# Patient Record
Sex: Female | Born: 1955
Health system: Southern US, Community
[De-identification: ages and names within clinical notes are randomized; demographics above are authoritative.]

## PROBLEM LIST (undated history)

## (undated) DIAGNOSIS — M1990 Unspecified osteoarthritis, unspecified site: Secondary | ICD-10-CM

## (undated) DIAGNOSIS — K219 Gastro-esophageal reflux disease without esophagitis: Secondary | ICD-10-CM

## (undated) DIAGNOSIS — I10 Essential (primary) hypertension: Secondary | ICD-10-CM

## (undated) DIAGNOSIS — D649 Anemia, unspecified: Secondary | ICD-10-CM

## (undated) DIAGNOSIS — N200 Calculus of kidney: Secondary | ICD-10-CM

## (undated) DIAGNOSIS — I719 Aortic aneurysm of unspecified site, without rupture: Secondary | ICD-10-CM

## (undated) DIAGNOSIS — K589 Irritable bowel syndrome without diarrhea: Secondary | ICD-10-CM

## (undated) DIAGNOSIS — N183 Chronic kidney disease, stage 3 unspecified: Secondary | ICD-10-CM

## (undated) DIAGNOSIS — T8859XA Other complications of anesthesia, initial encounter: Secondary | ICD-10-CM

## (undated) DIAGNOSIS — F419 Anxiety disorder, unspecified: Secondary | ICD-10-CM

## (undated) DIAGNOSIS — K649 Unspecified hemorrhoids: Secondary | ICD-10-CM

## (undated) DIAGNOSIS — E785 Hyperlipidemia, unspecified: Secondary | ICD-10-CM

## (undated) DIAGNOSIS — T4145XA Adverse effect of unspecified anesthetic, initial encounter: Secondary | ICD-10-CM

## (undated) DIAGNOSIS — E669 Obesity, unspecified: Secondary | ICD-10-CM

## (undated) DIAGNOSIS — G43909 Migraine, unspecified, not intractable, without status migrainosus: Secondary | ICD-10-CM

## (undated) DIAGNOSIS — F32A Depression, unspecified: Secondary | ICD-10-CM

## (undated) HISTORY — DX: Essential (primary) hypertension: I10

## (undated) HISTORY — DX: Unspecified hemorrhoids: K64.9

## (undated) HISTORY — DX: Anxiety disorder, unspecified: F41.9

## (undated) HISTORY — PX: GASTRIC BYPASS: SHX52

## (undated) HISTORY — DX: Irritable bowel syndrome, unspecified: K58.9

## (undated) HISTORY — DX: Anemia, unspecified: D64.9

## (undated) HISTORY — DX: Obesity, unspecified: E66.9

## (undated) HISTORY — DX: Hyperlipidemia, unspecified: E78.5

## (undated) HISTORY — DX: Aortic aneurysm of unspecified site, without rupture: I71.9

## (undated) HISTORY — DX: Calculus of kidney: N20.0

## (undated) HISTORY — DX: Depression, unspecified: F32.A

## (undated) HISTORY — PX: ABLATION: SHX5711

## (undated) HISTORY — DX: Migraine, unspecified, not intractable, without status migrainosus: G43.909

---
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
## 1998-04-16 ENCOUNTER — Other Ambulatory Visit: Admission: RE | Admit: 1998-04-16 | Discharge: 1998-04-16 | Payer: Self-pay | Admitting: Obstetrics and Gynecology

## 1998-06-11 ENCOUNTER — Other Ambulatory Visit: Admission: RE | Admit: 1998-06-11 | Discharge: 1998-06-11 | Payer: Self-pay | Admitting: Obstetrics and Gynecology

## 1999-10-07 ENCOUNTER — Other Ambulatory Visit: Admission: RE | Admit: 1999-10-07 | Discharge: 1999-10-07 | Payer: Self-pay | Admitting: Obstetrics and Gynecology

## 1999-11-11 ENCOUNTER — Encounter (INDEPENDENT_AMBULATORY_CARE_PROVIDER_SITE_OTHER): Payer: Self-pay

## 1999-11-11 ENCOUNTER — Ambulatory Visit (HOSPITAL_COMMUNITY): Admission: RE | Admit: 1999-11-11 | Discharge: 1999-11-11 | Payer: Self-pay | Admitting: Obstetrics and Gynecology

## 2000-03-15 ENCOUNTER — Encounter: Payer: Self-pay | Admitting: Sports Medicine

## 2000-03-15 ENCOUNTER — Ambulatory Visit (HOSPITAL_COMMUNITY): Admission: RE | Admit: 2000-03-15 | Discharge: 2000-03-15 | Payer: Self-pay | Admitting: Sports Medicine

## 2000-03-25 HISTORY — PX: LUMBAR DISC SURGERY: SHX700

## 2000-03-29 ENCOUNTER — Ambulatory Visit (HOSPITAL_COMMUNITY): Admission: RE | Admit: 2000-03-29 | Discharge: 2000-03-30 | Payer: Self-pay | Admitting: Neurosurgery

## 2000-03-29 ENCOUNTER — Encounter: Payer: Self-pay | Admitting: Neurosurgery

## 2001-07-05 ENCOUNTER — Other Ambulatory Visit: Admission: RE | Admit: 2001-07-05 | Discharge: 2001-07-05 | Payer: Self-pay | Admitting: Obstetrics and Gynecology

## 2002-05-11 ENCOUNTER — Emergency Department (HOSPITAL_COMMUNITY): Admission: EM | Admit: 2002-05-11 | Discharge: 2002-05-11 | Payer: Self-pay | Admitting: Emergency Medicine

## 2002-06-26 ENCOUNTER — Other Ambulatory Visit: Admission: RE | Admit: 2002-06-26 | Discharge: 2002-06-26 | Payer: Self-pay | Admitting: Obstetrics and Gynecology

## 2003-01-24 HISTORY — PX: LASER ABLATION CONDYLOMA CERVICAL / VULVAR: SUR819

## 2003-12-10 ENCOUNTER — Other Ambulatory Visit: Admission: RE | Admit: 2003-12-10 | Discharge: 2003-12-10 | Payer: Self-pay | Admitting: Obstetrics and Gynecology

## 2005-03-10 ENCOUNTER — Other Ambulatory Visit: Admission: RE | Admit: 2005-03-10 | Discharge: 2005-03-10 | Payer: Self-pay | Admitting: Obstetrics and Gynecology

## 2005-07-24 ENCOUNTER — Emergency Department (HOSPITAL_COMMUNITY): Admission: EM | Admit: 2005-07-24 | Discharge: 2005-07-24 | Payer: Self-pay | Admitting: Emergency Medicine

## 2005-07-27 ENCOUNTER — Ambulatory Visit: Payer: Self-pay | Admitting: Family Medicine

## 2005-08-25 ENCOUNTER — Ambulatory Visit: Payer: Self-pay | Admitting: Family Medicine

## 2005-10-25 ENCOUNTER — Ambulatory Visit: Payer: Self-pay | Admitting: Family Medicine

## 2005-11-23 ENCOUNTER — Ambulatory Visit: Payer: Self-pay | Admitting: Family Medicine

## 2006-03-19 ENCOUNTER — Emergency Department (HOSPITAL_COMMUNITY): Admission: EM | Admit: 2006-03-19 | Discharge: 2006-03-19 | Payer: Self-pay | Admitting: Family Medicine

## 2006-03-22 ENCOUNTER — Ambulatory Visit: Payer: Self-pay | Admitting: Family Medicine

## 2006-03-22 ENCOUNTER — Encounter: Admission: RE | Admit: 2006-03-22 | Discharge: 2006-03-22 | Payer: Self-pay | Admitting: Family Medicine

## 2006-08-17 ENCOUNTER — Ambulatory Visit: Payer: Self-pay | Admitting: Family Medicine

## 2006-12-06 ENCOUNTER — Ambulatory Visit: Payer: Self-pay | Admitting: Family Medicine

## 2007-12-27 ENCOUNTER — Telehealth: Payer: Self-pay | Admitting: Family Medicine

## 2007-12-30 DIAGNOSIS — G43909 Migraine, unspecified, not intractable, without status migrainosus: Secondary | ICD-10-CM | POA: Insufficient documentation

## 2007-12-30 DIAGNOSIS — E669 Obesity, unspecified: Secondary | ICD-10-CM | POA: Insufficient documentation

## 2007-12-30 DIAGNOSIS — I10 Essential (primary) hypertension: Secondary | ICD-10-CM | POA: Insufficient documentation

## 2007-12-30 DIAGNOSIS — F411 Generalized anxiety disorder: Secondary | ICD-10-CM | POA: Insufficient documentation

## 2008-01-02 ENCOUNTER — Telehealth (INDEPENDENT_AMBULATORY_CARE_PROVIDER_SITE_OTHER): Payer: Self-pay | Admitting: Internal Medicine

## 2008-01-09 ENCOUNTER — Ambulatory Visit: Payer: Self-pay | Admitting: Family Medicine

## 2008-02-13 ENCOUNTER — Ambulatory Visit: Payer: Self-pay | Admitting: Family Medicine

## 2008-02-13 DIAGNOSIS — J069 Acute upper respiratory infection, unspecified: Secondary | ICD-10-CM | POA: Insufficient documentation

## 2008-03-09 ENCOUNTER — Telehealth (INDEPENDENT_AMBULATORY_CARE_PROVIDER_SITE_OTHER): Payer: Self-pay | Admitting: Internal Medicine

## 2008-05-15 ENCOUNTER — Ambulatory Visit: Payer: Self-pay | Admitting: Family Medicine

## 2008-07-13 ENCOUNTER — Telehealth: Payer: Self-pay | Admitting: Family Medicine

## 2008-08-27 ENCOUNTER — Telehealth (INDEPENDENT_AMBULATORY_CARE_PROVIDER_SITE_OTHER): Payer: Self-pay | Admitting: Internal Medicine

## 2008-12-10 ENCOUNTER — Ambulatory Visit: Payer: Self-pay | Admitting: Family Medicine

## 2008-12-10 DIAGNOSIS — K589 Irritable bowel syndrome without diarrhea: Secondary | ICD-10-CM | POA: Insufficient documentation

## 2009-01-01 ENCOUNTER — Telehealth (INDEPENDENT_AMBULATORY_CARE_PROVIDER_SITE_OTHER): Payer: Self-pay | Admitting: Internal Medicine

## 2009-01-11 ENCOUNTER — Encounter (INDEPENDENT_AMBULATORY_CARE_PROVIDER_SITE_OTHER): Payer: Self-pay | Admitting: Internal Medicine

## 2009-01-19 ENCOUNTER — Telehealth (INDEPENDENT_AMBULATORY_CARE_PROVIDER_SITE_OTHER): Payer: Self-pay | Admitting: Internal Medicine

## 2009-02-12 ENCOUNTER — Encounter: Payer: Self-pay | Admitting: Internal Medicine

## 2009-04-08 ENCOUNTER — Ambulatory Visit: Payer: Self-pay | Admitting: Family Medicine

## 2010-05-02 ENCOUNTER — Telehealth: Payer: Self-pay | Admitting: Family Medicine

## 2010-05-06 ENCOUNTER — Ambulatory Visit: Payer: Self-pay | Admitting: Family Medicine

## 2010-05-13 ENCOUNTER — Ambulatory Visit: Payer: Self-pay | Admitting: Family Medicine

## 2010-05-16 LAB — CONVERTED CEMR LAB
ALT: 14 units/L (ref 0–35)
AST: 18 units/L (ref 0–37)
Albumin: 3.8 g/dL (ref 3.5–5.2)
Alkaline Phosphatase: 107 units/L (ref 39–117)
BUN: 11 mg/dL (ref 6–23)
Bilirubin, Direct: 0.1 mg/dL (ref 0.0–0.3)
CO2: 27 meq/L (ref 19–32)
Calcium: 9 mg/dL (ref 8.4–10.5)
Chloride: 104 meq/L (ref 96–112)
Cholesterol: 193 mg/dL (ref 0–200)
Creatinine, Ser: 0.8 mg/dL (ref 0.4–1.2)
GFR calc non Af Amer: 80.92 mL/min (ref >60)
Glucose, Bld: 102 mg/dL — ABNORMAL HIGH (ref 70–99)
HDL: 42.9 mg/dL (ref >39.00)
LDL Cholesterol: 133 mg/dL — ABNORMAL HIGH (ref 0–99)
Potassium: 4.2 meq/L (ref 3.5–5.1)
Sodium: 139 meq/L (ref 135–145)
Total Bilirubin: 0.4 mg/dL (ref 0.3–1.2)
Total CHOL/HDL Ratio: 4
Total Protein: 7 g/dL (ref 6.0–8.3)
Triglycerides: 87 mg/dL (ref 0.0–149.0)
VLDL: 17.4 mg/dL (ref 0.0–40.0)

## 2010-08-15 ENCOUNTER — Telehealth: Payer: Self-pay | Admitting: Family Medicine

## 2010-09-25 HISTORY — PX: OTHER SURGICAL HISTORY: SHX169

## 2010-10-16 ENCOUNTER — Encounter: Payer: Self-pay | Admitting: Family Medicine

## 2010-10-25 NOTE — Progress Notes (Signed)
Summary: Refill Fluoxetine and Bupropion  Phone Note Refill Request Call back at [phone] Message [facility]:  [facility] on May 02, 2010 9:28 AM  Refills Requested: Medication #1:  WELLBUTRIN XL 300 MG XR24H-TAB 1 daily fo ranxiety  Medication #2:  PROZAC 20 MG  CAPS 1 once daily Patient has not been seen in over a year.   Method Requested: Electronic Initial call taken by: Sydell Axon LPN,  May 02, 2010 9:31 AM  Follow-up for Phone Call        please refill both for 30 days and have patient schedule appointment.  No rf on either.  Follow-up by: Crawford Givens MD,  May 02, 2010 10:02 AM  Additional Follow-up for Phone Call Additional follow up Details #1::        Patient Advised.  Additional Follow-up by: [name] CMA Anuhea Gassner Dull),  May 02, 2010 10:37 AM    Prescriptions: WELLBUTRIN XL 300 MG XR24H-TAB (BUPROPION HCL) 1 daily fo ranxiety  #30 x 0   Entered by:   [name] CMA (AAMA)   Authorized by:   Crawford Givens MD   Signed by:   [name] CMA (AAMA) on 05/02/2010   Method used:   Electronically to        [facility] (retail)       [address]       [place], [place]  [address]       Ph: [phone] or [phone]       Fax: [phone]   RxID:   [id number] PROZAC 20 MG  CAPS (FLUOXETINE HCL) 1 once daily  #30 Capsule x 0   Entered by:   [name] CMA (AAMA)   Authorized by:   Crawford Givens MD   Signed by:   [name] CMA (AAMA) on 05/02/2010   Method used:   Elect[facility]        [facility] (retail)       [address]       [place], [place]  [address]       Ph: [phone] or [phone]       Fax: [phone]   RxID:   [id number]

## 2010-10-25 NOTE — Letter (Signed)
Summary: Pre Visit No Show Letter  [facility]  [address] [place], [place] [address]   Phone: [phone]  Fax: [phone]        Feb 12, 2009 MRN: [id number]    [facility] [address] [place], [place]  [address]    Dear Ms. Rens,   We have been unable to reach you by phone concerning the pre-procedure visit that you missed on 02/12/2009. For this reason,your procedure scheduled on06/12/2008 has been cancelled. Our scheduling staff will gladly assist you with rescheduling your appointments at a more convenient time. Please call our office at [phone] between the hours of 8:00am and 5:00pm, press option #2 to reach an appointment scheduler. Please consider updating your contact numbers at this time so that we can reach you by phone in the future with schedule changes or results.    Thank you,    Wyona Almas RN [facility]

## 2010-10-25 NOTE — Progress Notes (Signed)
Summary: fluoxetine refill  Phone Note Refill Request Message from:  [facility] on March 09, 2008 9:06 AM  Refills Requested: Medication #1:  PROZAC 20 MG  CAPS 1 once daily   Last Ref[facility]: [facility]/02/[facility] # [phone]  Initial call taken by: Cooper Render,  March 09, 2008 9:07 AM  Follow-up for Phone Call        Rx attatched   Billie-Lynn Tyler Deis FNP  March 09, 2008 11:40 AM   Additional Follow-up for Phone Call Additional follow up Details #1::        med phoned to pharmacy. Additional Follow-up by: Cooper Render,  March 09, 2008 12:33 PM      Prescriptions: PROZAC 20 MG  CAPS (FLUOXETINE HCL) 1 once daily  #30 x 3   Entered and Authorized by:   Gildardo Griffes FNP   Signed by:   Gildardo Griffes FNP on 03/09/2008   Method used:   Telephoned to ...         RxID:   [id number]

## 2010-10-25 NOTE — Assessment & Plan Note (Signed)
Summary: 6 m  f/u  dlo   Vital Signs:  Patient profile:   55 year old female Height:      68 inches Weight:      293 pounds BMI:     44.71 Temp:     98.7 degrees F oral Pulse rate:   80 / minute Pulse rhythm:   regular BP sitting:   122 / 82  (left arm) Cuff size:   large  Vitals Entered By: Sydell Axon (December 10, 2008 3:51 PM)  CC:  6 month follow-up.  History of Present Illness: Here for 6 mo follow up --having constipation alternating with diarrhea for mo.  Father has had polyps, mother has had gastrectomy due to severe PUD  --rare ativan, Wellbutrin 300 doing well, wants to continue   --obesity--has tried [employer] and some exercise in the past.  has thought about bariatric surg    Allergies: 1)  ! Morphine 2)  ! Cipro  Past History:  Past Medical History:    Reviewed history from 12/30/2007 and no changes required:    Hypertension    Anxiety  Past Surgical History:    Reviewed history from 12/30/2007 and no changes required:    childbirth    T & A age 32    cervical ablation uncontrollable bleeding 5/04    c- section    L 4-5 diskectomy Dr. Elesa Hacker 7/01  Review of Systems CV:  Complains of swelling of feet; denies chest pain or discomfort, palpitations, and swelling of hands. Resp:  Denies cough, shortness of breath, and wheezing. GI:  Complains of constipation and diarrhea; denies bloody stools, indigestion, nausea, and vomiting. MS:  Denies joint pain and muscle aches. Psych:  See HPI; Complains of anxiety and depression.  Physical Exam  General:  alert, well-developed, well-nourished, and well-hydrated.  morbidly obese, NAD Neck:  no JVD and no carotid bruits.   Lungs:  normal respiratory effort, no intercostal retractions, no accessory muscle use, and normal breath sounds.   Heart:  normal rate, regular rhythm, and no murmur.   Extremities:  no edema either lowerlleg Neurologic:  alert & oriented X3 and gait normal.   Skin:  turgor normal,  color normal, and no rashes.   Psych:  normally interactive and good eye contact.     Impression & Recommendations:  Problem # 1:  IRRITABLE BOWEL SYNDROME (ICD-564.1) Assessment New new episode of diarrhea dominant IBS refer to GI for further eval Orders: Gastroenterology Referral (GI)  Problem # 2:  OBESITY (ICD-278.00) Assessment: Unchanged morbidly obese with no wt change in months---discussed referral to [employer] or for Bariatric surg eval--BMI 44+ does not think she can afford, has done [employer] with +,- effects ? referral for counseling re eating problem  Problem # 3:  HYPERTENSION (ICD-401.9) Assessment: Unchanged stable on current med--will continue see back in 3 mo for follow up Her updated medication list for this problem includes:    Lopressor 50 Mg Tabs (Metoprolol tartrate) .Marland Kitchen... Take 1 tablet by mouth two times a day  BP today: 122/82 Prior BP: 130/73 (05/15/2008)  Problem # 4:  ANXIETY (ICD-300.00) Assessment: Unchanged reports that she is stable oncurrent meds in current doses--rare ativan The following medications were removed from the medication list:    Wellbutrin Xl 150 Mg Tb24 (Bupropion hcl) .Marland Kitchen... 1 every am Her updated medication list for this problem includes:    Ativan 0.5 Mg Tabs (Lorazepam) .Marland Kitchen... Take 1 tablet by mouth once a day as  needed    Prozac 20 Mg Caps (Fluoxetine hcl) .Marland Kitchen... 1 once daily    Wellbutrin Xl 300 Mg Xr24h-tab (Bupropion hcl) .Marland Kitchen... 1 daily fo ranxiety  Complete Medication List: 1)  Lopressor 50 Mg Tabs (Metoprolol tartrate) .... Take 1 tablet by mouth two times a day 2)  Ativan 0.5 Mg Tabs (Lorazepam) .... Take 1 tablet by mouth once a day as needed 3)  Prozac 20 Mg Caps (Fluoxetine hcl) .Marland Kitchen.. 1 once daily 4)  Wellbutrin Xl 300 Mg Xr24h-tab (Bupropion hcl) .Marland Kitchen.. 1 daily fo ranxiety  Patient Instructions: 1)  refer to Dr Loreta [address], GI 2)  Please schedule a follow-up appointment in 3 months.      Current Allergies  (reviewed today): ! MORPHINE ! CIPRO

## 2010-10-25 NOTE — Progress Notes (Signed)
Summary: lorazepam  Phone Note Refill Request Message from:  Fax from Pharmacy on August 15, 2010 9:22 AM  Refills Requested: Medication #1:  ATIVAN 0.5 MG  TABS Take 1 tablet by mouth once a day as needed   Last Refilled: 05/06/2010 [phone].  Initial call taken by: Melody Comas,  August 15, 2010 9:30 AM  Follow-up for Phone Call        please call in.  Follow-up by: Crawford Givens MD,  August 15, 2010 12:32 PM  Additional Follow-up for Phone Call Additional follow up Details #1::        Medication phoned to pharmacy.  Additional Follow-up by: [name] CMA Elisabeth Strom Dull),  August 15, 2010 2:21 PM    Prescriptions: ATIVAN 0.5 MG  TABS (LORAZEPAM) Take 1 tablet by mouth once a day as needed  #30 x 0   Entered and Authorized by:   Crawford Givens MD   Signed by:   Crawford Givens MD on 08/15/2010   Method used:   Telephoned to ...       [facility] (retail)       [address]       [place], [place]  [address]       Ph: [phone] or [phone]       Fax: [phone]   RxID:   [id number]

## 2010-10-25 NOTE — Progress Notes (Signed)
Summary: wellbutrion refill  Phone Note Refill Request Message from:  [facility] on March 09, 2008 9:04 AM  Refills Requested: Medication #1:  WELLBUTRIN XL 150 MG  TB24 1 qam   Last Ref[facility]: [facility]/09/[facility]  ph # [phone]  Initial call taken by: Cooper Render,  March 09, 2008 9:04 AM  Follow-up for Phone Call        Rx ataatched   Billie-Lynn Tyler Deis FNP  March 09, 2008 11:37 AM   Additional Follow-up for Phone Call Additional follow up Details #1::        med phoned to pharmacy. Additional Follow-up by: Cooper Render,  March 09, 2008 12:32 PM      Prescriptions: WELLBUTRIN XL 150 MG  TB24 (BUPROPION HCL) 1 qam  #30 x 3   Entered and Authorized by:   Gildardo Griffes FNP   Signed by:   Gildardo Griffes FNP on 03/09/2008   Method used:   Telephoned to ...         RxID:   [id number]

## 2010-10-25 NOTE — Progress Notes (Signed)
Summary: f luoxetine refill  Phone Note Refill Request Message from:  [facility] on December 27, 2007 9:36 AM  Refills Requested: Medication #1:  fluoxetine 40 mg   Last Refilled: 11/25/2007 last ov 12/06/06. pt needs ov now.  pc to pt's home ph #, msg left on a m pt must have ov, please phone to schedule.  Initial call taken by: Cooper Render,  December 27, 2007 9:37 AM  Follow-up for Phone Call        Please give pt one month of medication as requested until she can get in to be seen. Follow-up by: Shaune Leeks MD,  December 27, 2007 10:18 AM  Additional Follow-up for Phone Call Additional follow up Details #1::        ok Additional Follow-up by: Cooper Render,  December 27, 2007 10:49 AM

## 2010-10-25 NOTE — Progress Notes (Signed)
Summary: Fluoxetine  Phone Note Refill Request Message from:  [facility] on July 13, 2008 10:08 AM  Refills Requested: Medication #1:  PROZAC 20 MG  CAPS 1 once daily [facility]  Phone:   [phone]   Method Requested: Telephone to Pharmacy Initial call taken by: [name],  July 13, 2008 10:08 AM      Prescriptions: PROZAC 20 MG  CAPS (FLUOXETINE HCL) 1 once daily  #30 Capsule x 3   Entered by:   [name]   Authorized by:   Shaune Leeks MD   Signed by:   [name] on 07/13/2008   Method used:   Electronically [facility]        [facility] (retail)       [address]       [place], [place]  [address]       Ph: [phone] or [phone]       Fax: [phone]   RxID:   [id number] PROZAC 20 MG  CAPS (FLUOXETINE HCL) 1 once daily  #30 Capsule x 3   Entered and Authorized by:   Shaune Leeks MD   Signed by:   Shaune Leeks MD on 07/13/2008   Method used:   Telephoned to ...         RxID:   [id number]

## 2010-10-25 NOTE — Assessment & Plan Note (Signed)
Summary: 3 M F/U  DLO   Vital Signs:  Patient Profile:   55 Years Old Female Weight:      295 pounds Temp:     99.5 degrees F oral Pulse rate:   81 / minute BP sitting:   130 / 73  (left arm) Cuff size:   large  Vitals Entered By: Cooper Render (May 15, 2008 3:48 PM)                 Chief Complaint:  3 m f/u.  History of Present Illness: Here for follow up of anxiety--had been doing wel until last few wks when co-workerand good friend  dxed with stage 4 inflamitory breast disease.  Would like to increase Wellbutrin to 300mg  as previously discussed.  Uses Ativan rarely.  Tolerating Lopressor without side effects.    Current Allergies (reviewed today): ! MORPHINE ! CIPRO  Past Medical History:    Reviewed history from 12/30/2007 and no changes required:       Hypertension       Anxiety     Review of Systems      See HPI   Physical Exam  General:     alert, well-developed, well-nourished, and well-hydrated.  wt loss of 11 lbs since 5/09 Lungs:     normal respiratory effort, no intercostal retractions, no accessory muscle use, and normal breath sounds.   Heart:     normal rate, regular rhythm, and no murmur.   Extremities:     trace pitting edema both lower legs at 4:30pm Neurologic:     alert & oriented X3 and gait normal.   Psych:     normally interactive, not anxious appearing, not depressed appearing, and tearful.      Impression & Recommendations:  Problem # 1:  HYPERTENSION (ICD-401.9) Assessment: Unchanged well controlled on current med--will continue Her updated medication list for this problem includes:    Lopressor 50 Mg Tabs (Metoprolol tartrate) .Marland Kitchen... Take 1 tablet by mouth two times a day   Problem # 2:  ANXIETY (ICD-300.00) Assessment: Unchanged will increase Wellbutrin to 300mg  once daily--call response in 3-4 wkssee back in 6 mo if tolerates increase Her updated medication list for this problem includes:    Ativan 0.5 Mg Tabs  (Lorazepam) .Marland Kitchen... Take 1 tablet by mouth once a day as needed    Prozac 20 Mg Caps (Fluoxetine hcl) .Marland Kitchen... 1 once daily    Wellbutrin Xl 150 Mg Tb24 (Bupropion hcl) .Marland Kitchen... 1 every am    Wellbutrin Xl 300 Mg Xr24h-tab (Bupropion hcl) .Marland Kitchen... 1 daily fo ranxiety   Complete Medication List: 1)  Lopressor 50 Mg Tabs (Metoprolol tartrate) .... Take 1 tablet by mouth two times a day 2)  Ativan 0.5 Mg Tabs (Lorazepam) .... Take 1 tablet by mouth once a day as needed 3)  Prozac 20 Mg Caps (Fluoxetine hcl) .Marland Kitchen.. 1 once daily 4)  Wellbutrin Xl 150 Mg Tb24 (Bupropion hcl) .Marland Kitchen.. 1 every am 5)  Wellbutrin Xl 300 Mg Xr24h-tab (Bupropion hcl) .Marland Kitchen.. 1 daily fo ranxiety   Patient Instructions: 1)  Please schedule a follow-up appointment in 6 months.   Prescriptions: WELLBUTRIN XL 300 MG XR24H-TAB (BUPROPION HCL) 1 daily fo ranxiety  #30 x 6   Entered and Authorized by:   Gildardo Griffes FNP   Signed by:   Gildardo Griffes FNP on 05/15/2008   Method used:   Print then Give to Patient   RxID:   [id number]  ] Prior  Medications (reviewed today): LOPRESSOR 50 MG  TABS (METOPROLOL TARTRATE) Take 1 tablet by mouth two times a day ATIVAN 0.5 MG  TABS (LORAZEPAM) Take 1 tablet by mouth once a day as needed PROZAC 20 MG  CAPS (FLUOXETINE HCL) 1 once daily WELLBUTRIN XL 150 MG  TB24 (BUPROPION HCL) 1 every am Current Allergies (reviewed today): ! MORPHINE ! CIPRO

## 2010-10-25 NOTE — Assessment & Plan Note (Signed)
Summary: 3 M F/U DLO   Vital Signs:  Patient profile:   55 year old female Height:      68 inches Weight:      283.75 pounds BMI:     43.30 Temp:     98.5 degrees F oral Pulse rate:   76 / minute Pulse rhythm:   regular BP sitting:   140 / 82  (left arm) Cuff size:   large  Vitals Entered By: Lewanda Rife LPN (April 08, 2009 3:53 PM)  CC:  three month follow up for hypertension.  History of Present Illness: Here for follow up of several medicalproblems --depression --taking wellbutrin and prozac--thinks doses are adequate, feels that she is stable mood-wise.  handling family stresses well--dad's MI, friend with cancer, etc --tolerating lopressor without side-effects --weight was not discussed  Had colonoscopy scheduled, put on hold due to dad's MI--will get rescheduled.  Allergies: 1)  ! Morphine 2)  ! Cipro   Impression & Recommendations:  Problem # 1:  HYPERTENSION (ICD-401.9) Assessment Unchanged stable on current med--will continue at this dose see back in 4 mo Her updated medication list for this problem includes:    Lopressor 50 Mg Tabs (Metoprolol tartrate) .Marland Kitchen... Take 1 tablet by mouth two times a day  BP today: 140/82 Prior BP: 122/82 (12/10/2008)  Problem # 2:  ANXIETY (ICD-300.00) Assessment: Unchanged stable on current meds--will continue at current doses denies homocidal or suicidakl ideation Her updated medication list for this problem includes:    Ativan 0.5 Mg Tabs (Lorazepam) .Marland Kitchen... Take 1 tablet by mouth once a day as needed    Prozac 20 Mg Caps (Fluoxetine hcl) .Marland Kitchen... 1 once daily    Wellbutrin Xl 300 Mg Xr24h-tab (Bupropion hcl) .Marland Kitchen... 1 daily fo ranxiety  Problem # 3:  OBESITY (ICD-278.00) Assessment: Comment Only has lost 10 lbs over past 6 mo--  Complete Medication List: 1)  Lopressor 50 Mg Tabs (Metoprolol tartrate) .... Take 1 tablet by mouth two times a day 2)  Ativan 0.5 Mg Tabs (Lorazepam) .... Take 1 tablet by mouth once a day as  needed 3)  Prozac 20 Mg Caps (Fluoxetine hcl) .Marland Kitchen.. 1 once daily 4)  Wellbutrin Xl 300 Mg Xr24h-tab (Bupropion hcl) .Marland Kitchen.. 1 daily fo ranxiety 5)  Ibuprofen 200 Mg Tabs (Ibuprofen) .... Otc as directed 6)  Promethazine Hcl 25 Mg Tabs (Promethazine hcl) .... Takes 1/2 to 1 as needed for nausea  Patient Instructions: 1)  Please schedule a follow-up appointment in 4 months. Prescriptions: LOPRESSOR 50 MG  TABS (METOPROLOL TARTRATE) Take 1 tablet by mouth two times a day  #60 Tablet x 6   Entered and Authorized by:   Gildardo Griffes FNP   Signed by:   Gildardo Griffes FNP on 04/08/2009   Method used:   [facility]        [facility] (retail)       [address]       [place], [place]  [address]       Ph: [phone] or [phone]       Fax: [phone]   RxID:   [id number] WELLBUTRIN XL 300 MG XR24H-TAB (BUPROPION HCL) 1 daily fo ranxiety  #30 x 6   Entered and Authorized by:   Gildardo Griffes FNP   Signed by:   Gildardo Griffes FNP on 04/08/2009   Method used:   Electronically to        [facility] [phone]* (retail)  [address]       [place], [place]  [address]       Ph: [phone] or [phone]       Fax: [phone]   RxID:   [id number] PROZAC 20 MG  CAPS (FLUOXETINE HCL) 1 once daily  #30 Capsule x 6   Entered and Authorized by:   Gildardo Griffes FNP   Signed by:   Gildardo Griffes FNP on 04/08/2009   Method used:   Electronically to        [facility] (retail)       [address]       [place], [place]  [address]       Ph: [phone] or [phone]       Fax: [phone]   RxID:   [id number]   Current Allergies (reviewed today): ! MORPHINE ! CIPRO

## 2010-10-25 NOTE — Letter (Signed)
Summary: Pt.cancelled appt. w/ Dr.Jyothi Mann  Pt.cancelled appt. w/ Dr.Jyothi Mann   Imported By: Beau Fanny 01/13/2009 09:18:00  _____________________________________________________________________  External Attachment:    Type:   Image     Comment:   External Document

## 2010-10-25 NOTE — Assessment & Plan Note (Signed)
Summary: F/U BP,[name]/CLE   Vital Signs:  Patient profile:   55 year old female Height:      68 inches Weight:      280.75 pounds BMI:     42.84 Temp:     98.6 degrees F oral Pulse rate:   80 / minute Pulse rhythm:   regular BP sitting:   122 / 74  (left arm) Cuff size:   large  Vitals Entered By: [name] CMA Carmen Buchanan) (May 06, 2010 2:44 PM) CC: F/U  BP  (BDB pt.).  Pt. needs refills on Prozac, Wellbutrin and Lorazepam.   History of Present Illness: Hypertension:      Using medication without problems or lightheadedness: yes Chest pain with exertion: no Edema: no Short of breath: only due to deconditioning/habits Average home BPs: usually similar to today Other issues: no  Anxiety: On meds for years due to panic dx in 2006.  Rare use of BZD.  Mood okay and sleeping well usually.  No SI/HI.    Minimal exercise.  Interested in walking.    Allergies: 1)  ! Morphine 2)  ! Cipro  Past History:  Past Medical History: Anxiety HTN obesity intermittent IBS symptoms   Family History: Reviewed history from 12/30/2007 and no changes required. fam hx of:  CAD, colon polyps F alive, CAD/MI, CABG M alive, AAA on betablocker, cardiac valvular disease, stomach ulcers  Social History: Reviewed history from 12/30/2007 and no changes required. Marital Status: Married, 1982 Children: 1 son Occupation: Sales executive in [employer] no tob alcohol, minimal  likes to [employer], church, time with family minimal exercise  Review of Systems       See HPI.  Otherwise negative.    Physical Exam  General:  GEN: nad, alert and oriented, obese HEENT: mucous membranes moist NECK: supple w/o LA CV: rrr.  no murmur PULM: ctab, no inc wob ABD: soft, +bs EXT: no edema SKIN: no acute rash    Impression & Recommendations:  Problem # 1:  ANXIETY (ICD-300.00) No change in meds.  d/w patient UJ:[id number] and she is to consider.  symptoms controlled.  Her updated  medication list for this problem includes:    Ativan 0.5 Mg Tabs (Lorazepam) .Marland Kitchen... Take 1 tablet by mouth once a day as needed    Prozac 20 Mg Caps (Fluoxetine hcl) .Marland Kitchen... 1 once daily    Wellbutrin Xl 300 Mg Xr24h-tab (Bupropion hcl) .Marland Kitchen... 1 daily fo ranxiety  Problem # 2:  HYPERTENSION (ICD-401.9) No change in meds.  d/w patient [id number] and weight.  return for labs.  Her updated medication list for this problem includes:    Lopressor 50 Mg Tabs (Metoprolol tartrate) .Marland Kitchen... Take 1 tablet by mouth two times a day  Complete Medication List: 1)  Lopressor 50 Mg Tabs (Metoprolol tartrate) .... Take 1 tablet by mouth two times a day 2)  Ativan 0.5 Mg Tabs (Lorazepam) .... Take 1 tablet by mouth once a day as needed 3)  Prozac 20 Mg Caps (Fluoxetine hcl) .Marland Kitchen.. 1 once daily 4)  Wellbutrin Xl 300 Mg Xr24h-tab (Bupropion hcl) .Marland Kitchen.. 1 daily fo ranxiety 5)  Ibuprofen 200 Mg Tabs (Ibuprofen) .... Otc as directed 6)  Promethazine Hcl 25 Mg Tabs (Promethazine hcl) .... Takes 1/2 to 1 as needed for nausea  Patient Instructions: 1)  Come back for fasting labs. We'll contact you with your lab report.  2)  CMET 401.1 3)  lipid  401.1 Prescriptions: ATIVAN 0.5 MG  TABS (LORAZEPAM)  Take 1 tablet by mouth once a day as needed  #30 x 0   Entered and Authorized by:   Crawford Givens MD   Signed by:   Crawford Givens MD on 05/06/2010   Method used:   Print then Give to Patient   RxID:   [id number] WELLBUTRIN XL 300 MG XR24H-TAB (BUPROPION HCL) 1 daily fo ranxiety  #30 x 12   Entered and Authorized by:   Crawford Givens MD   Signed by:   Crawford Givens MD on 05/06/2010   Method used:   Electronically to        [facility]  [place] (retail)       [address]       [place], [place]  [address]       Ph: [phone] or [phone]       Fax: [phone]   RxID:   [id number] PROZAC 20 MG  CAPS (FLUOXETINE HCL) 1 once daily  #30 Capsule x 12   Entered and Authorized by:   Crawford Givens MD    Signed by:   Crawford Givens MD on 05/06/2010   Method used:   Electronically to        [facility]  [place] (retail)       [address]       [place], [place]  [address]       Ph: [phone] or [phone]       Fax: [phone]   RxID:   [id number] LOPRESSOR 50 MG  TABS (METOPROLOL TARTRATE) Take 1 tablet by mouth two times a day  #60 Tablet x 12   Entered and Authorized by:   Crawford Givens MD   Signed by:   Crawford Givens MD on 05/06/2010   Method used:   Electronically to        [facility]  [place] (retail)       [address]       [place], [place]  [address]       Ph: [phone] or [phone]       Fax: [phone]   RxID:   [id number]   Current Allergies (reviewed today): ! MORPHINE ! CIPRO

## 2010-10-25 NOTE — Assessment & Plan Note (Signed)
Summary: 1 MONTH FOLLOWUP/RBH   Vital Signs:  Patient Profile:   55 Years Old Female Weight:      306 pounds Temp:     98.2 degrees F oral Pulse rate:   76 / minute BP sitting:   134 / 82  (left arm) Cuff size:   large  Vitals Entered By: Cooper Render (Feb 13, 2008 3:28 PM)                 Chief Complaint:  1 mth f/u.  History of Present Illness: here fo r follow up of start of wellbutrin and reduction of proxac---feels better.  Wants to continue this dose. Some walking.    Current Allergies (reviewed today): ! MORPHINE ! CIPRO     Review of Systems      See HPI   Physical Exam  General:     alert, well-developed, well-nourished, well-hydrated, and overweight-appearing.  NAD Ears:     Tms retracted with fluid bilat Nose:     mucosal erythema and mucosal edema.  sinuses +,- Mouth:     no exudates and pharyngeal erythema.   Lungs:     normal respiratory effort, no intercostal retractions, no accessory muscle use, and normal breath sounds.   Psych:     Oriented X3, normally interactive, good eye contact, not anxious appearing, and not depressed appearing.      Impression & Recommendations:  Problem # 1:  ANXIETY (ICD-300.00) Assessment: Improved better on current meds--will continue same doses see back in 3 mo. Her updated medication list for this problem includes:    Ativan 0.5 Mg Tabs (Lorazepam) .Marland Kitchen... Take 1 tablet by mouth once a day as needed    Prozac 20 Mg Caps (Fluoxetine hcl) .Marland Kitchen... 1 once daily    Wellbutrin Xl 150 Mg Tb24 (Bupropion hcl) .Marland Kitchen... 1 qam   Problem # 2:  URI (ICD-465.9) Assessment: New continue comfort care measures: increase po fluids, rest, tylenol or IBP as needed will start Septra x 10d see back if not improved  Complete Medication List: 1)  Lopressor 50 Mg Tabs (Metoprolol tartrate) .... Take 1 tablet by mouth two times a day 2)  Ativan 0.5 Mg Tabs (Lorazepam) .... Take 1 tablet by mouth once a day as needed 3)   Prozac 20 Mg Caps (Fluoxetine hcl) .Marland Kitchen.. 1 once daily 4)  Wellbutrin Xl 150 Mg Tb24 (Bupropion hcl) .Marland Kitchen.. 1 qam 5)  Septra Ds 800-160 Mg Tabs (Sulfamethoxazole-trimethoprim) .Marland Kitchen.. 1 two times a day by mouth   Patient Instructions: 1)  Please schedule a follow-up appointment in 3 months.   Prescriptions: SEPTRA DS 800-160 MG  TABS (SULFAMETHOXAZOLE-TRIMETHOPRIM) 1 two times a day by mouth  #20 x 0   Entered and Authorized by:   Gildardo Griffes FNP   Signed by:   Gildardo Griffes FNP on 02/13/2008   Method used:   Print then Give to Patient   RxID:   [id number] WELLBUTRIN XL 150 MG  TB24 (BUPROPION HCL) 1 qam  #30 x 3   Entered and Authorized by:   Gildardo Griffes FNP   Signed by:   Gildardo Griffes FNP on 02/13/2008   Method used:   Print then Give to Patient   RxID:   [id number]  ] Prior Medications (reviewed today): LOPRESSOR 50 MG  TABS (METOPROLOL TARTRATE) Take 1 tablet by mouth two times a day ATIVAN 0.5 MG  TABS (LORAZEPAM) Take 1 tablet by mouth once a day as needed PROZAC  20 MG  CAPS (FLUOXETINE HCL) 1 once daily WELLBUTRIN XL 150 MG  TB24 (BUPROPION HCL) 1 qam Current Allergies (reviewed today): ! MORPHINE ! CIPRO

## 2010-10-25 NOTE — Progress Notes (Signed)
Summary: refill fluoxetine  Phone Note Refill Request Message from:  [employer] on January 19, 2009 8:26 AM  Refills Requested: Medication #1:  PROZAC 20 MG  CAPS 1 once daily   Last Ref[facility]: [facility]/11/2008 [phone]  Initial call taken by: Providence Crosby,  January 19, 2009 8:26 AM  Follow-up for Phone Call        Rx complete  Billie-Lynn Tyler Deis FNP  January 19, 2009 8:27 AM   Rx called to pharmacy. Follow-up by: Sydell Axon,  January 19, 2009 9:09 AM      Prescriptions: PROZAC 20 MG  CAPS (FLUOXETINE HCL) 1 once daily  #30 Capsule x 3   Entered and Authorized by:   Gildardo Griffes FNP   Signed by:   Sydell Axon on 01/19/2009   Method used:   Telephoned to ...         RxID:   [id number]

## 2010-10-25 NOTE — Progress Notes (Signed)
Summary: Prozac phoned in to pharmacy  Prozac phoned in to pharmacy   Imported By: Beau Fanny 01/09/2008 10:12:46  _____________________________________________________________________  External Attachment:    Type:   Image     Comment:   External Document

## 2010-10-25 NOTE — Progress Notes (Signed)
Summary: wants meds for travel  Phone Note Call from Patient Call back at [phone]   Caller: Patient Call For: Gildardo Griffes FNP Summary of Call: Pt will be going to [place] and is requesting scripts for doxycyline and cipro- wants 45 days on the doxy.  [facility].  She said you did this for her last year. Initial call taken by: Lowella Petties,  August 27, 2008 9:06 AM  Follow-up for Phone Call        Rxs sent  Billie-Lynn Tyler Deis FNP  August 27, 2008 1:28 PM     New/Updated Medications: DOXYCYCLINE HYCLATE 100 MG CPEP (DOXYCYCLINE HYCLATE) 1 daily for 1-2d prior to leaving and continue for 4 wks daily after return CIPRO 500 MG TABS (CIPROFLOXACIN HCL) 1 two times a day at onset of diarrhea and continue for 5-10d by mouth   Prescriptions: CIPRO 500 MG TABS (CIPROFLOXACIN HCL) 1 two times a day at onset of diarrhea and continue for 5-10d by mouth  #20 x 0   Entered and Authorized by:   Gildardo Griffes FNP   Signed by:   Gildardo Griffes FNP on 08/27/2008   Method used:   [facility]        [facility] [id number]       [address]       [place], [place]  [address]       Ph: [phone] or [phone]       Fax: [phone]   RxID:   [id number] DOXYCYCLINE HYCLATE 100 MG CPEP (DOXYCYCLINE HYCLATE) 1 daily for 1-2d prior to leaving and continue for 4 wks daily after return  #45 x 0   Entered and Authorized by:   Gildardo Griffes FNP   Signed by:   Gildardo Griffes FNP on 08/27/2008   Method used:   [facility]        [facility] (retail)       [address]       [place], [place]  [address]       Ph: [phone] or [phone]       Fax: [phone]   RxID:   [id number]

## 2010-10-25 NOTE — Progress Notes (Signed)
Summary: refill bupropion   Phone Note Refill Request Message from:  [facility] on January 01, 2009 8:07 AM  Refills Requested: Medication #1:  WELLBUTRIN XL 300 MG XR24H-TAB 1 daily fo ranxiety.   Last Refilled: [facility]/2008 [facility] [phone]  Initial call taken by: Providence Crosby,  January 01, 2009 8:08 AM      Prescriptions: WELLBUTRIN XL 300 MG XR24H-TAB (BUPROPION HCL) 1 daily fo ranxiety  #30 x 6   Entered and Authorized by:   Gildardo Griffes FNP   Signed by:   Gildardo Griffes FNP on 01/01/2009   Method used:   [facility]        [facility] (retail)       [address]       [place], [place]  [address]       Ph: [phone] or [phone]       Fax: [phone]   RxID:   [id number]

## 2010-10-25 NOTE — Assessment & Plan Note (Signed)
Summary: CHECK BP/CLE   Vital Signs:  Patient Profile:   55 Years Old Female Weight:      309 pounds Temp:     98.7 degrees F oral Pulse rate:   86 / minute BP sitting:   126 / 82  (left arm) Cuff size:   large  Vitals Entered By: Cooper Render (January 09, 2008 3:07 PM)                 Chief Complaint:  f/u HTN.  History of Present Illness: Here for follow up of HBP, not seen since 3/08.  Tolerating Lopressor without problem, does have lower leg edema by end of afternoon--stands during much of her work day.  Wants to discuss wt gain, emotional eating--feels she has gained since increaase in prozac dosage last yr.  Knows she also eats to comfort herself.    Current Allergies (reviewed today): ! MORPHINE ! CIPRO  Past Medical History:    Reviewed history from 12/30/2007 and no changes required:       Hypertension       Anxiety     Review of Systems      See HPI   Physical Exam  General:     alert, well-developed, well-nourished, and well-hydrated.  morbid obesity   NAD Neck:     normal carotid upstroke and no carotid bruits.   Lungs:     normal respiratory effort, no intercostal retractions, and no accessory muscle use.   Heart:     normal rate, regular rhythm, and no murmur.   Extremities:     trace pitting to mid shins bilat at 4pm Neurologic:     alert & oriented X3 and gait normal.   Skin:     turgor normal, color normal, and no rashes.   Psych:     normally interactive, good eye contact, not anxious appearing, and not depressed appearing.      Impression & Recommendations:  Problem # 1:  HYPERTENSION (ICD-401.9) Assessment: Unchanged stable on current med at current dose, will continue needs fasting labs Her updated medication list for this problem includes:    Lopressor 50 Mg Tabs (Metoprolol tartrate) .Marland Kitchen... Take 1 tablet by mouth two times a day   Problem # 2:  ANXIETY (ICD-300.00) Assessment: Deteriorated feel side effects have  increased since increase in dosage of prozac will reduce dose to 20mg  and add welbutrin 150 qma see back in 1 mo for follow up spent in room in counseling Her updated medication list for this problem includes:    Ativan 0.5 Mg Tabs (Lorazepam) .Marland Kitchen... Take 1 tablet by mouth once a day as needed    Prozac 20 Mg Caps (Fluoxetine hcl) .Marland Kitchen... 1 once daily    Wellbutrin Xl 150 Mg Tb24 (Bupropion hcl) .Marland Kitchen... 1 qam   Problem # 3:  OBESITY (ICD-278.00) Assessment: Deteriorated up 8lbs in past yr no exercise--agreed to walk for 20-30 min 4xwk over next mo--will keep log agreed to cut down on caloric intake--want 5 lbs off in next 1 mo--agreed  Complete Medication List: 1)  Lopressor 50 Mg Tabs (Metoprolol tartrate) .... Take 1 tablet by mouth two times a day 2)  Ativan 0.5 Mg Tabs (Lorazepam) .... Take 1 tablet by mouth once a day as needed 3)  Prozac 20 Mg Caps (Fluoxetine hcl) .Marland Kitchen.. 1 once daily 4)  Wellbutrin Xl 150 Mg Tb24 (Bupropion hcl) .Marland Kitchen.. 1 qam   Patient Instructions: 1)  Please schedule a follow-up appointment  in 1 month.    Prescriptions: WELLBUTRIN XL 150 MG  TB24 (BUPROPION HCL) 1 qam  #30 x 1   Entered and Authorized by:   Gildardo Griffes FNP   Signed by:   Gildardo Griffes FNP on 01/09/2008   Method used:   Print then Give to Patient   RxID:   [id number] PROZAC 20 MG  CAPS (FLUOXETINE HCL) 1 once daily  #30 x 1   Entered and Authorized by:   Gildardo Griffes FNP   Signed by:   Gildardo Griffes FNP on 01/09/2008   Method used:   Print then Give to Patient   RxID:   [id number]  ] Prior Medications (reviewed today): LOPRESSOR 50 MG  TABS (METOPROLOL TARTRATE) Take 1 tablet by mouth two times a day ATIVAN 0.5 MG  TABS (LORAZEPAM) Take 1 tablet by mouth once a day as needed Current Allergies (reviewed today): ! MORPHINE ! CIPRO

## 2010-11-11 ENCOUNTER — Encounter (HOSPITAL_COMMUNITY)
Admission: RE | Admit: 2010-11-11 | Discharge: 2010-11-11 | Disposition: A | Discharge status: Home / Self Care | Payer: PRIVATE HEALTH INSURANCE | Source: Ambulatory Visit | Attending: Obstetrics and Gynecology | Admitting: Obstetrics and Gynecology

## 2010-11-11 DIAGNOSIS — Z01812 Encounter for preprocedural laboratory examination: Secondary | ICD-10-CM | POA: Insufficient documentation

## 2010-11-11 DIAGNOSIS — Z01810 Encounter for preprocedural cardiovascular examination: Secondary | ICD-10-CM | POA: Insufficient documentation

## 2010-11-11 LAB — BASIC METABOLIC PANEL
BUN: 10 mg/dL (ref 6–23)
CO2: 26 mEq/L (ref 19–32)
Calcium: 8.9 mg/dL (ref 8.4–10.5)
Chloride: 104 mEq/L (ref 96–112)
Creatinine, Ser: 0.73 mg/dL (ref 0.4–1.2)
GFR calc Af Amer: >60 mL/min (ref >60)
GFR calc non Af Amer: >60 mL/min (ref >60)
Glucose, Bld: 73 mg/dL (ref 70–99)
Potassium: 4.1 mEq/L (ref 3.5–5.1)
Sodium: 136 mEq/L (ref 135–145)

## 2010-11-11 LAB — CBC
HCT: 40.3 % (ref 36.0–46.0)
Hemoglobin: 13.4 g/dL (ref 12.0–15.0)
MCH: 29.9 pg (ref 26.0–34.0)
MCHC: 33.3 g/dL (ref 30.0–36.0)
MCV: 90 fL (ref 78.0–100.0)
Platelets: 347 10*3/uL (ref 150–400)
RBC: 4.48 MIL/uL (ref 3.87–5.11)
RDW: 13.4 % (ref 11.5–15.5)
WBC: 10.1 10*3/uL (ref 4.0–10.5)

## 2010-11-17 ENCOUNTER — Other Ambulatory Visit: Payer: Self-pay | Admitting: Obstetrics and Gynecology

## 2010-11-17 ENCOUNTER — Ambulatory Visit (HOSPITAL_COMMUNITY): Payer: PRIVATE HEALTH INSURANCE

## 2010-11-17 ENCOUNTER — Ambulatory Visit (HOSPITAL_COMMUNITY)
Admission: RE | Admit: 2010-11-17 | Discharge: 2010-11-17 | Disposition: A | Discharge status: Home / Self Care | Payer: PRIVATE HEALTH INSURANCE | Source: Ambulatory Visit | Attending: Obstetrics and Gynecology | Admitting: Obstetrics and Gynecology

## 2010-11-17 DIAGNOSIS — N950 Postmenopausal bleeding: Secondary | ICD-10-CM | POA: Insufficient documentation

## 2010-11-17 DIAGNOSIS — N840 Polyp of corpus uteri: Secondary | ICD-10-CM | POA: Insufficient documentation

## 2010-11-23 NOTE — Op Note (Signed)
  NAMETRAVIS, PURK                ACCOUNT NO.:  [id number]  MEDICAL RECORD NO.:  [id number]           PATIENT TYPE:  O  LOCATION:  [facility]                          [facility]  PHYSICIAN:  Carlyn Mullenbach L. Leticia Coletta, M.D.DATE OF BIRTH:  1956/03/30  DATE OF PROCEDURE:  11/17/2010 DATE OF DISCHARGE:                              OPERATIVE REPORT   PREOPERATIVE DIAGNOSES: 1. Postmenopausal bleeding. 2. History of endometrial ablation.  POSTOPERATIVE DIAGNOSES: 1. Postmenopausal bleeding. 2. History of endometrial ablation. 3. Endometrial polyps.  PROCEDURES:  Dilation and curettage, hysteroscopy, ThermaChoice endometrial ablation, all performed with ultrasound guidance.  SURGEON:  Giovana Faciane L. Vincente Poli, MD  ANESTHESIA:  LMA with local.  FINDINGS:  Positive polyps seen.  SPECIMENS:  Uterine curettings, sent to pathology.  ESTIMATED BLOOD LOSS:  Minimal.  COMPLICATIONS:  None.  PROCEDURE:  The patient was taken to the operating room.  She is a 55- year-old female who has a history of endometrial ablation in the past with a ThermaChoice.  She returned to my office with postmenopausal bleeding.  Ultrasound did show some polypoid material near the fundus. Because of history of endometrial ablation, I felt we should do this procedure under ultrasound guidance to minimize the risk of uterine perforation because of the fear of stenosis of the lower uterine segment and the cervix.  With ultrasound guidance, this procedure was performed. She was prepped and draped.  In-and-out catheter was then performed. She was gently placed in Trendelenburg position.  A speculum was placed in the vagina.  The cervix was grasped with a tenaculum, paracervical block was performed in a standard fashion.  The cervical internal os was gently dilated with ultrasound guidance and actually surprisingly, it was done pretty easily.  This was done with [facility] dilators.  We then inserted the diagnostic hysteroscope,  and I could see in the endometrium that there was a lot of endometrial tissue and a lot of it appeared polypoid in nature.  It was in the upper portion of the uterus.  The hysteroscope was removed, and a sharp curette was inserted under ultrasound direction, and the uterus was thoroughly curetted of all tissue, was sent to pathology and polyp forceps were inserted as well and the tissue was sent to pathology.  We then inserted the ThermaChoice III balloon under ultrasound guidance and easily I was able to perform a ThermaChoice endometrial ablation according to the manufacturer's specifications for 8 minutes, intact balloon was removed.  There were no complications at all.  The patient tolerated the procedure well.  All sponge, lap, and instrument counts were correct x2.  The patient went to recovery room in stable condition.     Sheddrick Lattanzio L. Vincente Poli, M.D.     Florestine Avers  D:  11/17/2010  T:  11/18/2010  Job:  [id number]  Electronically Signed by Marcelle Overlie M.D. on 11/23/2010 01:44:13 PM

## 2011-02-10 NOTE — Op Note (Signed)
[facility]  Patient:    Carmen Buchanan, Carmen Buchanan                       MRN: [id number] Proc. Date: 11/11/99 Adm. Date:  [id number] Disc. Date: [id number] Attending:  Marcelle Overlie                           Operative Report  PREOPERATIVE DIAGNOSES:       Rule out endometrial polyp; rule out endometrial carcinoma.  POSTOPERATIVE DIAGNOSES:      Rule out endometrial polyp, rule out endometrial carcinoma.  PROCEDURE:                    Dilatation and curettage; hysteroscopy.  SURGEON:                      Marcelle Overlie, M.D.  ANESTHESIA:                   LMA.  ESTIMATED BLOOD LOSS:         Less than 50 cc.  FINDINGS:                     1. Shaggy endometrium.                               2. Posterior intramural fibroid.                               3. A questionable polyp which was removed in the                                  curettings.  COMPLICATIONS:                None.  DESCRIPTION OF PROCEDURE:     Patient was taken to the operating room where she was given anesthesia.  She was then placed in the low lithotomy position.  The vagina and vulva were prepped and draped in the usual sterile fashion.  In-and-out catheter was used to empty the bladder.  A speculum was placed in the vagina. he cervix was grasped with a tenaculum and the uterus was sounded to 10 cm.  The cervical internal os was gently dilated using Pratt dilators.  The diagnostic hysteroscope was then inserted into the uterine cavity and there was good distention.  The entire cavity was visualized.  There was noted to be very shaggy endometrium.  There was irregularity in the posterior wall of the uterus which as consistent with an intramural fibroid; there was also question of a polyp coming off the anterior wall of the uterus.  The diagnostic hysteroscope was removed and then a thorough sharp curettage was performed of the uterus.  The posterior fibroid was palpated and  felt with a curette.  All curettings were then sent to pathology for permanent section.  All sponge, lap and instrument counts were correct x 2.  Patient tolerated procedure well and went to recovery room in stable condition.   COMPLICATIONS:                None.  DRAINS:  None.DD:  11/11/99 TD:  11/13/99 Job: [id number]

## 2011-02-10 NOTE — Op Note (Signed)
[place]  Patient:    Carmen Buchanan, Carmen Buchanan                       MRN: [id number] Proc. Date: 03/29/00 Adm. Date:  [id number] Attending:  Jackelyn Knife                           Operative Report  PREOPERATIVE DIAGNOSIS:  Herniated intervertebral disk left L4-L5.  POSTOPERATIVE DIAGNOSIS:  Herniated intervertebral disk left L4-L5.  OPERATION PERFORMED:  Left L4-L5 hemilaminotomy and diskectomy.  SURGEON:  Izell Bowie. Elesa Hacker, M.D.  ASSISTANTOctavio Graves Cox.  INDICATIONS:  Ms. Oncale is a generally healthy 55 year old lady presented with back and radiating left leg pain, which did not respond to conservative measures.  She had a lumbosacral MRI study done in the third week of June and this showed a rather large disk rupture at L4-5 with a probable free fragment in the left lateral recess.  She developed numbness on the dorsum of the foot and also had weakness on examination that involved dorsiflexion.  She did not respond to conservative measures and is therefore admitted at this time for the procedure described below.  Details of the proposed operation were discussed with her and goals and risks were pointed out.  She understands the risks to include the risk of bleeding, infection, damage to the common dural tube, CSF leak, damage to nerve roots, which could produce weakness and/or numbness.  She understands that intraabdominal injury is possible.  PROCEDURE:  Under general endotracheal anesthesia, this lady was positioned prone over the laminectomy frame.  The lumbosacral area was prepped and the patient draped in usual manner.  A preliminary cross table lateral film was taken for positive identification of the proper operative level.  A midline incision was opened and the usual subperiosteal approach was carried out to the left L4-L5 intralaminar space.  A second intraoperative x-ray was taken at this point with a metallic marker in place to  definitively locate the proper operative level.  Utilizing the operating microscope and high-speed drill, a hemilaminotomy was performed.  The dural sac and nerve root were identified after overlying ligamentum flavum was removed.  Initial efforts at retraction of the dural sac and nerve roots medially was hampered because of an underlying soft tissue mass that proved to be a quite large disk herniation, part of which was extruded.  The extruded material was removed with a nerve hook and pituitary rongeur and this greatly facilitated retraction.  The interspace was then entered and cleaned of its contents using rongeurs and curets.  A careful search was then made for additional extruded fragments and none were found.  The wound was copiously irrigated with antibiotic solution and then closed in layers.  A sterile dressing was applied and the patient was taken to the recovery room in good condition. DD:  03/29/00 TD:  03/29/00 Job: [id number]

## 2011-02-10 NOTE — H&P (Signed)
[place]  Patient:    Carmen Buchanan, Carmen Buchanan                       MRN: [id number] Adm. 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  She presented with a chief complaint of pain originating in her low back, then radiating across the left buttock and on down to the posterior aspect of the leg to her foot and ankle.  Her symptoms have been particularly bad over the last two weeks, but started several weeks prior to that.  She has had no difficulty with her right leg and has had no trouble with bowel and bladder control.  She was treated conservatively and has been out of work for the last two weeks with no improvement in her back and left leg pain.  She underwent a lumbosacral MRI on March 15, 2000 and this demonstrated a rather large disk rupture at L4-5 on the left.  There was evidence that this herniation probably contained a free fragment in the left lateral recess.  PAST MEDICAL HISTORY:  No problems with high blood pressure, stroke or diabetes.  FAMILY HISTORY:  Her mother is living at age 90 but has stomach problems.  Her father is living at age 86 with heart and stomach problems.  She has two brothers, ages 44 and 76, living and well.  She has a 51 year old sister living and well.  She has one 24 year old child living and well.  PREVIOUS SURGERY:  Tonsillectomy in 1969, D&C in 2001 and a C-section in 1986.  CURRENT MEDICATIONS:  Ibuprofen and Percocet.  ALLERGIES:  She has no known drug allergies.  PERSONAL HISTORY:  She does not smoke or use alcohol.  REVIEW OF SYSTEMS:  She has had some headaches which have responded to conservative treatment.  Additional review of systems is pertinent as already mentioned in the history of present illness.  PHYSICAL EXAMINATION:  GENERAL:  She weighs  260 pounds and 5 feet 9 inches tall.  She is afebrile.  HEENT:  Examination is normal.  CHEST:  Clear.  CARDIAC:  No murmurs.  Heart sounds were somewhat distant because of obesity.  ABDOMEN:  Soft and nontender with no palpable masses.  NEUROLOGIC:  Sensorium and cranial nerves to be intact.  Range of motion of lumbosacral spine is limited on forward bending and tilting to the left with an increase in back and left leg pain.  She is able to walk on heel and toe but she does have dorsiflexor weakness of the foot and great toe on the left. Straight leg raising is negative on the right and extremely painful on the left.  Individual muscle testing shows significant dorsiflexor weakness involving the foot and great toe on the left side.  She has some hypesthesia over the top of the foot on the left side.  Again, she has no adenopathy or mass lesions in her back, buttocks or legs.  IMPRESSION:  Herniated intervertebral disk, central and left, L4-5. DD:  03/29/00 TD:  03/30/00 Job: [id number]

## 2011-02-23 ENCOUNTER — Other Ambulatory Visit: Payer: Self-pay | Admitting: Family Medicine

## 2011-02-24 ENCOUNTER — Other Ambulatory Visit: Payer: Self-pay | Admitting: *Deleted

## 2011-02-24 MED ORDER — BUPROPION HCL ER (XL) 300 MG PO TB24: 300.0000 mg | ORAL_TABLET | Freq: Every day | ORAL | Status: DC

## 2011-02-24 NOTE — Telephone Encounter (Signed)
rx sent by MD

## 2011-02-24 NOTE — Telephone Encounter (Signed)
Ok to refill Wellbutrin 

## 2011-05-13 ENCOUNTER — Other Ambulatory Visit: Payer: Self-pay | Admitting: Family Medicine

## 2011-06-15 ENCOUNTER — Encounter: Payer: Self-pay | Admitting: Family Medicine

## 2011-06-16 ENCOUNTER — Encounter: Payer: Self-pay | Admitting: Family Medicine

## 2011-06-16 ENCOUNTER — Ambulatory Visit (INDEPENDENT_AMBULATORY_CARE_PROVIDER_SITE_OTHER): Payer: PRIVATE HEALTH INSURANCE | Admitting: Family Medicine

## 2011-06-16 DIAGNOSIS — E669 Obesity, unspecified: Secondary | ICD-10-CM

## 2011-06-16 DIAGNOSIS — I10 Essential (primary) hypertension: Secondary | ICD-10-CM

## 2011-06-16 DIAGNOSIS — F411 Generalized anxiety disorder: Secondary | ICD-10-CM

## 2011-06-16 MED ORDER — BUPROPION HCL ER (XL) 300 MG PO TB24: 300.0000 mg | ORAL_TABLET | ORAL | Status: DC

## 2011-06-16 MED ORDER — METOPROLOL TARTRATE 50 MG PO TABS: 50.0000 mg | ORAL_TABLET | Freq: Two times a day (BID) | ORAL | Status: DC

## 2011-06-16 MED ORDER — FLUOXETINE HCL 20 MG PO CAPS: 20.0000 mg | ORAL_CAPSULE | Freq: Every day | ORAL | Status: DC

## 2011-06-16 NOTE — Assessment & Plan Note (Addendum)
Discussed comorbid conditions, provided with letter of necessity (see chart) Discussed how bariatric surgery is lifestyle change that needs commitment.  Pt aware, has been to seminar and w/u by surgery. Asked to bring copy of labs from upcoming precert appt.

## 2011-06-16 NOTE — Patient Instructions (Addendum)
Letter provided today. Refilled meds for 1 year. Please return as needed. Bring copy of labwork for [place] to have. I may have you make appointment with Dr. Algis Downs for physical to go over preventative care and to follow up on surgery (in 3-4 months).

## 2011-06-16 NOTE — Assessment & Plan Note (Addendum)
Chronic, stable. Did not take med this morning. BP Readings from Last 3 Encounters:  06/16/11 136/82  05/06/10 122/74  04/08/09 140/82  continue meds.

## 2011-06-16 NOTE — Progress Notes (Signed)
Subjective:    Patient ID: Carmen Buchanan, female    DOB: 1956/03/05, 55 y.o.   MRN: [id number]  HPI CC: HTN   Here for med refill.  Also would like letter for bariatric referral  HTN - did not take med this morning.  bp slightly elevated but HR 68.  No new HA, vision changes, CP/tightness, significant leg swelling.  Occasional SOB.  Prone to migraines.  Tolerates metoprolol fine.  Tries to limit salt, gets plenty of fluids.  Mood - stable on current meds.  Obesity - has been to seminar.  Has seen surgery.  Has appt for precert next week.  Also has back problems and knee pain, attributed to weight.  H/o ruptured disk 2001.  Has tried several weight loss products/programs - form [employer], weight watchers, low car diet, low fat diet, [place] diet, exercise throughout this.  Last CPE 1 year ago, likely due.  Medications and allergies reviewed and updated in chart.  Past histories reviewed and updated if relevant as below. Patient Active Problem List  Diagnoses  . OBESITY  . ANXIETY  . MIGRAINE HEADACHE  . HYPERTENSION  . URI  . IRRITABLE BOWEL SYNDROME   Past Medical History  Diagnosis Date  . Anxiety   . HTN (hypertension)   . Obesity   . IBS (irritable bowel syndrome)     intermittent  . Migraine, unspecified, without mention of intractable migraine without mention of status migrainosus    Past Surgical History  Procedure Date  . Tonsillectomy and adenoidectomy 1970  . Laser ablation condyloma cervical / vulvar 5/04    uncontrollable bleeding  . Cesarean section   . Lumbar disc surgery 7/01    L4-5; Dr. Elesa Hacker   History  Substance Use Topics  . Smoking status: Never Smoker   . Smokeless tobacco: Not on file  . Alcohol Use: Yes     Minimal   Family History  Problem Relation Age of Onset  . Coronary artery disease Father     MI; CABG  . Aortic aneurysm Mother     on betablocker  . Other Mother     Cardiovascualr disease  . Ulcers Mother   . Colon polyps     Family history   Allergies  Allergen Reactions  . Ciprofloxacin     REACTION: nausea  . Morphine     REACTION: HA and NAV   Current Outpatient Prescriptions on File Prior to Visit  Medication Sig Dispense Refill  . ibuprofen (ADVIL,MOTRIN) 200 MG tablet Take 200 mg by mouth every 6 (six) hours as needed.        Marland Kitchen LORazepam (ATIVAN) 0.5 MG tablet Take 0.5 mg by mouth daily as needed.         Review of Systems Per HPI    Objective:   Physical Exam  Nursing note and vitals reviewed. Constitutional: She appears well-developed and well-nourished. No distress.       obese  HENT:  Head: Normocephalic and atraumatic.  Mouth/Throat: Oropharynx is clear and moist. No oropharyngeal exudate.  Eyes: Conjunctivae and EOM are normal. Pupils are equal, round, and reactive to light. No scleral icterus.  Neck: Normal range of motion. Neck supple. Carotid bruit is not present.  Cardiovascular: Normal rate, regular rhythm, normal heart sounds and intact distal pulses.   No murmur heard. Pulmonary/Chest: Effort normal and breath sounds normal. No respiratory distress. She has no wheezes. She has no rales.  Abdominal: Soft. There is no tenderness.  No abd/renal bruit  Lymphadenopathy:    She has no cervical adenopathy.  Skin: Skin is warm and dry. No rash noted.  Psychiatric: She has a normal mood and affect.          Assessment & Plan:

## 2011-06-16 NOTE — Assessment & Plan Note (Signed)
Chronic stable. Mood stable.  Refilled med.

## 2011-07-19 ENCOUNTER — Encounter: Payer: Self-pay | Admitting: Family Medicine

## 2011-07-28 ENCOUNTER — Ambulatory Visit (INDEPENDENT_AMBULATORY_CARE_PROVIDER_SITE_OTHER): Payer: PRIVATE HEALTH INSURANCE | Admitting: Family Medicine

## 2011-07-28 ENCOUNTER — Encounter: Payer: Self-pay | Admitting: Family Medicine

## 2011-07-28 VITALS — BP 116/76 | HR 78 | Temp 98.7°F | Wt 264.0 lb

## 2011-07-28 DIAGNOSIS — J329 Chronic sinusitis, unspecified: Secondary | ICD-10-CM

## 2011-07-28 DIAGNOSIS — Z9884 Bariatric surgery status: Secondary | ICD-10-CM | POA: Insufficient documentation

## 2011-07-28 MED ORDER — AZITHROMYCIN 250 MG PO TABS: ORAL_TABLET | ORAL | Status: AC

## 2011-07-28 NOTE — Progress Notes (Signed)
F/u from gastric bypass surgery.  The bariatric clinic as been very supportive.  Surgery done ~2.5 weeks ago.  She been mainly on clear liquids.  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   Down 26lbs from preop weight on our scales.  She having some diarrhea and constipation. No pain now.   Cut back to 50mg /day on metoprolol.  She's to start actigall later in the week.   Headache, ear ache, sinus pressure.  No fevers.  +ST.  Occ sputum with cough.  Had some nausea from the surgery.    Meds, vitals, and allergies reviewed.   ROS: See HPI.  Otherwise, noncontributory.  Current outpatient prescriptions:buPROPion (WELLBUTRIN XL) 300 MG 24 hr tablet, Take 1 tablet (300 mg total) by mouth every morning., Disp: 30 tablet, Rfl: 11;  calcium carbonate (OS-CAL) 1250 MG chewable tablet, Chew 2 tablets by mouth 2 (two) times daily.  , Disp: , Rfl: ;  FLUoxetine (PROZAC) 20 MG capsule, Take 1 capsule (20 mg total) by mouth daily., Disp: 30 capsule, Rfl: 11 LORazepam (ATIVAN) 0.5 MG tablet, Take 0.5 mg by mouth daily as needed.  , Disp: , Rfl: ;  Methylcellulose, Laxative, (CITRUCEL) 500 MG TABS, Take 1 tablet by mouth daily.  , Disp: , Rfl: ;  metoCLOPramide (REGLAN) 10 MG tablet, Take 10 mg by mouth 4 (four) times daily as needed.  , Disp: , Rfl: ;  omeprazole (PRILOSEC) 20 MG capsule, Take 20 mg by mouth 2 (two) times daily.  , Disp: , Rfl:  azithromycin (ZITHROMAX Z-PAK) 250 MG tablet, Take 2 tablets (500 mg) on  Day 1,  followed by 1 tablet (250 mg) once daily on Days 2 through 5., Disp: 6 each, Rfl: 0  GEN: nad, alert and oriented, obese HEENT: mucous membranes moist, tm w/o erythema, nasal exam w/o erythema, clear discharge noted,  OP with cobblestoning, Max sinus ttp NECK: supple w/o LA CV: rrr.   PULM: ctab, no inc wob EXT: no edema SKIN: no acute rash, abd incisions healing well

## 2011-07-28 NOTE — Patient Instructions (Signed)
Stop the metoprolol and let us know if your pressure stays high (130s/80s or lower are okay). I would get a flu shot each fall.   Drink fluids as directed, take the antibiotics, and gargle with warm salt water for your throat.  This should gradually improve.  Take care.  Let us know if you have other concerns.

## 2011-07-29 ENCOUNTER — Encounter: Payer: Self-pay | Admitting: Family Medicine

## 2011-07-29 DIAGNOSIS — J329 Chronic sinusitis, unspecified: Secondary | ICD-10-CM | POA: Insufficient documentation

## 2011-07-29 NOTE — Assessment & Plan Note (Signed)
Zithromax, supportive care and f/u prn.  Nontoxic.

## 2011-07-29 NOTE — Assessment & Plan Note (Signed)
Recent events d/w pt, prev notes from bariatric clinic reviewed.  Abd wall healing, weight loss noted, will stop BB at this point.  She'll monitor BP at work and notify me if BP is sig elevated.  App help of bariatric clinic.  >25 min spent with face to face with patient, >50% counseling and/or coordinating care.

## 2011-07-31 ENCOUNTER — Encounter: Payer: Self-pay | Admitting: Family Medicine

## 2011-09-26 HISTORY — PX: HEMORRHOID SURGERY: SHX153

## 2011-11-07 ENCOUNTER — Encounter: Payer: Self-pay | Admitting: Family Medicine

## 2011-11-07 ENCOUNTER — Ambulatory Visit (INDEPENDENT_AMBULATORY_CARE_PROVIDER_SITE_OTHER): Payer: PRIVATE HEALTH INSURANCE | Admitting: Family Medicine

## 2011-11-07 DIAGNOSIS — K649 Unspecified hemorrhoids: Secondary | ICD-10-CM

## 2011-11-07 MED ORDER — HYDROCORTISONE 2.5 % RE CREA: TOPICAL_CREAM | Freq: Two times a day (BID) | RECTAL | Status: AC

## 2011-11-07 NOTE — Progress Notes (Signed)
Hemorrhoid pain.  Going on for about 10 days.  Pain at work, leaning over patients.  H/o gastric bypass.  Irritation from bowel movement frequency recently.  Not having to strain with BMs.  Was told she had int and ext hemorrhoids prev.   Anxiety, much improved with weight loss.  Weaning off prozac and wellbutrin.    Meds, vitals, and allergies reviewed.   ROS: See HPI.  Otherwise, noncontributory.  nad Chaperoned exam, mult ext hemorrhoids, circumferential, no acute thrombosis, no fissure

## 2011-11-07 NOTE — Patient Instructions (Signed)
I would try a sitz bath and use the foam.   Call if you need a referral to the surgery clinic.

## 2011-11-08 ENCOUNTER — Encounter: Payer: Self-pay | Admitting: Family Medicine

## 2011-11-08 ENCOUNTER — Telehealth: Payer: Self-pay | Admitting: Family Medicine

## 2011-11-08 DIAGNOSIS — K649 Unspecified hemorrhoids: Secondary | ICD-10-CM | POA: Insufficient documentation

## 2011-11-08 NOTE — Telephone Encounter (Signed)
Referral ordered

## 2011-11-08 NOTE — Telephone Encounter (Signed)
Pt would like a referral put in for Hemmorrhoid surgery. Pt prefer's Dr. Clent Ridges or Dr. Adolphus Birchwood at [facility] is possible. Pt is saying any day and time would be ok.

## 2011-11-08 NOTE — Assessment & Plan Note (Signed)
Sitz bath, topical tx and refer to gen surg.

## 2011-11-10 ENCOUNTER — Telehealth: Payer: Self-pay | Admitting: Family Medicine

## 2012-10-14 ENCOUNTER — Encounter: Payer: Self-pay | Admitting: Family Medicine

## 2012-10-14 ENCOUNTER — Ambulatory Visit (INDEPENDENT_AMBULATORY_CARE_PROVIDER_SITE_OTHER): Payer: PRIVATE HEALTH INSURANCE | Admitting: Family Medicine

## 2012-10-14 ENCOUNTER — Telehealth: Payer: Self-pay | Admitting: Family Medicine

## 2012-10-14 VITALS — BP 118/78 | HR 83 | Temp 98.1°F | Wt 206.0 lb

## 2012-10-14 DIAGNOSIS — J329 Chronic sinusitis, unspecified: Secondary | ICD-10-CM

## 2012-10-14 DIAGNOSIS — F411 Generalized anxiety disorder: Secondary | ICD-10-CM

## 2012-10-14 MED ORDER — LORAZEPAM 0.5 MG PO TABS: 0.5000 mg | ORAL_TABLET | Freq: Every day | ORAL | Status: DC | PRN

## 2012-10-14 MED ORDER — AMOXICILLIN-POT CLAVULANATE 875-125 MG PO TABS: 1.0000 | ORAL_TABLET | Freq: Two times a day (BID) | ORAL | Status: DC

## 2012-10-14 NOTE — Telephone Encounter (Signed)
Will see today at OV.

## 2012-10-14 NOTE — Telephone Encounter (Signed)
Patient Information:  Caller Name: Joleigh  Phone: [phone]  Patient: Carmen Buchanan, Carmen Buchanan  Gender: Female  DOB: 1955/12/15  Age: 57 Years  PCP: [name] [facility])  Pregnant: No  Office Follow Up:  Does the office need to follow up with this patient?: No  Instructions For The Office: N/A  RN Note:  pt reports feeling short of breath  Symptoms  Reason For Call & Symptoms: sinus infection symptoms (green drainage, deep cough)  Reviewed Health History In EMR: Yes  Reviewed Medications In EMR: Yes  Reviewed Allergies In EMR: Yes  Reviewed Surgeries / Procedures: Yes  Date of Onset of Symptoms: 10/07/2012  Treatments Tried: 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

## 2012-10-14 NOTE — Assessment & Plan Note (Signed)
Continue prn BZD.  

## 2012-10-14 NOTE — Progress Notes (Signed)
Has been sick for a week.  Zmax per dental clinic didn't help.  No fevers.  She has more stuffiness than SOB.  Cough started recently, no sputum.  Facial pressure.  + ear pain.  No wheeze.  Coughing in fits.  No myalgias.    Hadn't a flu shot this year.   Discussed.    H/o anxiety, controlled with prn- rare use- ativan.  Needs refill.  Good effect, no ADE.   Meds, vitals, and allergies reviewed.   ROS: See HPI.  Otherwise, noncontributory.  GEN: nad, alert and oriented HEENT: mucous membranes moist, tm w/o erythema, nasal exam w/o erythema, clear discharge noted,  OP with cobblestoning, sinuses ttp x4 NECK: supple w/o LA CV: rrr.   PULM: ctab, no inc wob EXT: no edema SKIN: no acute rash

## 2012-10-14 NOTE — Patient Instructions (Addendum)
Start the antibiotics today.  Drink plenty of fluids, take tylenol as needed, and gargle with warm salt water for your throat.  This should gradually improve.  Take care.  Let us know if you have other concerns.  

## 2012-10-14 NOTE — Assessment & Plan Note (Signed)
Change to augmentin, supportive tx o/w and f/u prn.  She agrees.  Nontoxic.

## 2012-10-16 ENCOUNTER — Telehealth: Payer: Self-pay | Admitting: Family Medicine

## 2012-10-16 ENCOUNTER — Ambulatory Visit (INDEPENDENT_AMBULATORY_CARE_PROVIDER_SITE_OTHER): Payer: PRIVATE HEALTH INSURANCE | Admitting: Family Medicine

## 2012-10-16 ENCOUNTER — Encounter: Payer: Self-pay | Admitting: Family Medicine

## 2012-10-16 VITALS — BP 120/84 | HR 80 | Temp 98.0°F | Wt 206.0 lb

## 2012-10-16 DIAGNOSIS — J329 Chronic sinusitis, unspecified: Secondary | ICD-10-CM

## 2012-10-16 MED ORDER — FLUTICASONE PROPIONATE 50 MCG/ACT NA SUSP: NASAL | Status: DC

## 2012-10-16 NOTE — Progress Notes (Signed)
Seen 2 days ago.  Congestion isn't improved.  She's had 5 doses of augmentin so far.  No fevers.  Facial pain, ear pain.  ST from nasal congestion.  Some cough, but mostly from clearing her throat.  Is using nasal saline.  She's about 10 days into the illness.   Meds, vitals, and allergies reviewed.   ROS: See HPI.  Otherwise, noncontributory.  GEN: nad, alert and oriented HEENT: mucous membranes moist, tm w/o erythema, nasal exam w/o erythema, clear discharge noted,  OP with cobblestoning, sinuses ttp x4.  NECK: supple w/o LA CV: rrr.   PULM: ctab, no inc wob EXT: no edema

## 2012-10-16 NOTE — Telephone Encounter (Signed)
Patient Information:  Caller Name: Davinity  Phone: [phone]  Patient: Carmen Buchanan  Gender: Female  DOB: 11/18/1955  Age: 57 Years  PCP: [name] [facility])  Pregnant: No  Office Follow Up:  Does the office need to follow up with this patient?: No  Instructions For The Office: N/A  RN Note:  Triager advised pt that if her pain was severe she needs to be seen in office now.  Pt said she was ok to wait for the 2 PM slot with Dr. Para March and she will call back if she feels like she needs to be sooner.  Appt was scheduled for 2 PM with Dr. Para March  Symptoms  Reason For Call & Symptoms: Pt calling today 10/16/12 regarding has sinus infection and she works in [employer] office and her doctor there presribed a Z Pack that she started on 10/09/12 finished this on Saturday 10/12/12 and could not tell any difference.  Was seen in office on 10/14/12 by Dr. Para March and he prescribed Augmentin, pt is still not feeling better.  Still having headache, sinus pressure and ear pain.  Reviewed Health History In EMR: Yes  Reviewed Medications In EMR: Yes  Reviewed Allergies In EMR: Yes  Reviewed Surgeries / Procedures: Yes  Date of Onset of Symptoms: 10/07/2012  Treatments Tried: Saline spray, antbiotics, OTC Muccinex, Tylenol  Treatments Tried Worked: No OB / GYN:  LMP: Unknown  Guideline(s) Used:  Sinus Pain and Congestion  Disposition Per Guideline:   Go to Office Now  Reason For Disposition Reached:   Severe sinus pain  Advice Given:  N/A  Appointment Scheduled:  10/16/2012 14:00:00 Appointment Scheduled Provider:  [name] [facility])

## 2012-10-16 NOTE — Patient Instructions (Addendum)
Add on the flonase and this should get better.  Take care.

## 2012-10-16 NOTE — Telephone Encounter (Signed)
Will see today at OV.  

## 2012-10-16 NOTE — Assessment & Plan Note (Signed)
Nontoxic, add on flonase and f/u prn.  She should gradually improved.  She agrees with the plan.

## 2012-10-17 ENCOUNTER — Other Ambulatory Visit: Payer: Self-pay | Admitting: *Deleted

## 2012-10-17 DIAGNOSIS — L309 Dermatitis, unspecified: Secondary | ICD-10-CM | POA: Insufficient documentation

## 2012-10-17 MED ORDER — BETAMETHASONE DIPROPIONATE AUG 0.05 % EX OINT: TOPICAL_OINTMENT | Freq: Every day | CUTANEOUS | Status: DC | PRN

## 2012-10-17 NOTE — Telephone Encounter (Signed)
Sent. Thanks.   

## 2012-10-17 NOTE — Telephone Encounter (Signed)
Refill request faxed from pharmacy for betamethasone oint. Med was not in med list so I called pt for information. Pt states it was originally prescribed years ago by her old dermatologist  Dr Londell Moh, but she recently had her employer refill it. 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 Is it ok to fill this med?

## 2012-10-22 ENCOUNTER — Telehealth: Payer: Self-pay | Admitting: Family Medicine

## 2012-10-22 MED ORDER — AMOXICILLIN-POT CLAVULANATE 875-125 MG PO TABS: 1.0000 | ORAL_TABLET | Freq: Two times a day (BID) | ORAL | Status: DC

## 2012-10-22 NOTE — Telephone Encounter (Signed)
Patient advised.

## 2012-10-22 NOTE — Telephone Encounter (Signed)
Please call her back and get more details on this- other symptoms- cough, fever, etc.  Thanks.

## 2012-10-22 NOTE — Telephone Encounter (Signed)
[facility] Triage Call Report Triage Record Num: [id number] Operator: Tomasita Crumble Patient Name: Carmen Buchanan Call Date & Time: 10/21/2012 5:54:07PM Patient Phone: [phone] PCP: Patient Gender: Female PCP Fax : Patient DOB: 10-17-1955 Practice Name: [facility] Reason for Call: Caller: Carmen Buchanan/Patient; PCP: Carmen Buchanan) [facility]); CB#: [phone]; Call regarding Sinus infection follow up; Headache not relieved by OTC meds and pain rainges from 5-8. MIld to moderate headache for more than 24 hours unrelieved with nonprescription medicines prompts See Provider within 24 hours. Upper Respirarory Infection protocol used. Caller states she wants to avoid another co-pay since she was in the office twice last week. She will finish Augmentin on 1/29 and does not want to run out of antibiotic before asking for a refill. Protocol(s) Used: Upper Respiratory Infection (URI) Recommended Outcome per Protocol: See Provider within 24 hours Reason for Outcome: Mild to moderate headache for more than 24 hours unrelieved with nonprescription medications Care Advice: ~ Use a cool mist humidifier to moisten air. Be sure to clean according to manufacturer's instructions. Call provider if headache worsens, develops temperature greater than 101.48F (38.1C) or any temperature elevation in a geriatric or immunocompromised patient (such as diabetes, HIV/AIDS, chemotherapy, organ transplant, or chronic steroid use). ~ Drink more fluids -- water, low-sugar juices, tea and warm soup, especially chicken broth, are options. Avoid caffeinated or alcoholic beverages because they can increase the chance of dehydration. ~ A warm, moist compress placed on face, over eyes for 15 to 20 minutes, 5 to 6 times a day, may help relieve the congestion. ~ Analgesic/Antipyretic Advice - Acetaminophen: Consider acetaminophen as directed on label or by pharmacist/provider for pain or  fever. PRECAUTIONS: - Use only if there is no history of liver disease, alcoholism, or intake of three or more alcohol drinks per day. - If approved by provider when breastfeeding. - Do not exceed recommended dose or frequency. ~ Total water intake includes drinking water, water in beverages, and water contained in food. Fluids make up about 80% of the body's total hydration need. Individual fluid requirement to maintain hydration vary based on physical activity, environmental factors and illness. Limit fluids that contain sugar, caffeine, or alcohol. Urine will be very light yellow color when you drink enough fluids. ~ 10/21/2012 6:04:06PM Page 1 of 1 [id number]

## 2012-10-22 NOTE — Telephone Encounter (Signed)
Patient says there is no cough, fever, ST.  It just seems that her head is still packed with congestion causing HA and she is still blowing out yellow/green mucous.  She says it seems obvious that she has not been able to completely get rid of the congestion and is afraid that when she finishes the ABX tomorrow, it will all come back with a vengeance.  She is asking if we could extend the ABX?

## 2012-10-22 NOTE — Telephone Encounter (Signed)
rx sent to extend the rx another week. I would expect this to cover it.  Would continue the flonase in the meantime.  Thanks.

## 2013-06-06 ENCOUNTER — Encounter: Payer: Self-pay | Admitting: Family Medicine

## 2013-06-06 ENCOUNTER — Ambulatory Visit (INDEPENDENT_AMBULATORY_CARE_PROVIDER_SITE_OTHER): Payer: PRIVATE HEALTH INSURANCE | Admitting: Family Medicine

## 2013-06-06 ENCOUNTER — Ambulatory Visit (INDEPENDENT_AMBULATORY_CARE_PROVIDER_SITE_OTHER)
Admission: RE | Admit: 2013-06-06 | Discharge: 2013-06-06 | Disposition: A | Discharge status: Home / Self Care | Payer: PRIVATE HEALTH INSURANCE | Source: Ambulatory Visit | Attending: Family Medicine | Admitting: Family Medicine

## 2013-06-06 ENCOUNTER — Ambulatory Visit: Payer: PRIVATE HEALTH INSURANCE | Admitting: Family Medicine

## 2013-06-06 VITALS — BP 130/76 | HR 71 | Temp 98.3°F | Wt 208.8 lb

## 2013-06-06 DIAGNOSIS — R109 Unspecified abdominal pain: Secondary | ICD-10-CM

## 2013-06-06 DIAGNOSIS — F411 Generalized anxiety disorder: Secondary | ICD-10-CM

## 2013-06-06 DIAGNOSIS — N200 Calculus of kidney: Secondary | ICD-10-CM

## 2013-06-06 LAB — POCT URINALYSIS DIPSTICK
Bilirubin, UA: NEGATIVE
Glucose, UA: NEGATIVE
Ketones, UA: NEGATIVE
Leukocytes, UA: NEGATIVE
Nitrite, UA: NEGATIVE
Spec Grav, UA: 1.015
Urobilinogen, UA: NEGATIVE
pH, UA: 6

## 2013-06-06 MED ORDER — LORAZEPAM 0.5 MG PO TABS: 0.5000 mg | ORAL_TABLET | Freq: Every day | ORAL | Status: DC | PRN

## 2013-06-06 MED ORDER — HYDROCODONE-ACETAMINOPHEN 5-325 MG PO TABS: 1.0000 | ORAL_TABLET | ORAL | Status: DC | PRN

## 2013-06-06 NOTE — Patient Instructions (Addendum)
Go see Shirlee Limerick before your leave to get an appointment with urology for early next week.  Take the pain medicine as needed.  Sedation caution.  Take care.

## 2013-06-06 NOTE — Progress Notes (Signed)
H/o anxiety, controlled with prn- rare use- ativan. Needs refill. Good effect, no ADE.   No known h/o renal stones. Recently with bloody urine about 10 days ago.  Since then with "labor pains" episodically.  It eased off some, but then had another episode this AM.  Pain the R flank.  Radiates to the groin.  No fevers.  She has hot flashes at baseline.  Pain 4-5/10 now.  She uses sugar free drink mixes.  Frequency but no burning with urination.    H/o gastric bypass.   Meds, vitals, and allergies reviewed.   ROS: See HPI.  Otherwise, noncontributory.  nad ncat Mmm rrr ctab abd soft but R flank ttp Normal BS No rebound Ext w/o edema  KUB and u/a noted, reviewed with patient.

## 2013-06-07 DIAGNOSIS — N200 Calculus of kidney: Secondary | ICD-10-CM | POA: Insufficient documentation

## 2013-06-07 NOTE — Assessment & Plan Note (Signed)
Controlled with prn- rare use- ativan. Needs refill. Good effect, no ADE.

## 2013-06-07 NOTE — Assessment & Plan Note (Signed)
Too large to pass.  Refer to uro.  D/w pt. Hydrocodone for pain in meantime. She agrees.  Images d/w pt.  Doesn't appear to be infected and would not start abx.  No fevers.  Okay for outpatient f/u. >25 min spent with face to face with patient, >50% counseling and/or coordinating care.

## 2013-06-16 ENCOUNTER — Other Ambulatory Visit: Payer: Self-pay | Admitting: Urology

## 2013-06-23 ENCOUNTER — Telehealth: Payer: Self-pay

## 2013-06-23 NOTE — Telephone Encounter (Signed)
Please send a copy of the xray to Dr. Vincente Poli.  Thanks.

## 2013-06-23 NOTE — Telephone Encounter (Signed)
Pt left v/m she is seeing a urologist about multiple kidney stones and has appt with Dr Vincente Poli OB-GYN on 06/27/13 re: uterine fibroid. Pt request copy of abdominal xray sent to Dr Jacqulyn Bath advise.

## 2013-06-24 NOTE — Telephone Encounter (Signed)
Sent!

## 2013-07-04 ENCOUNTER — Encounter (HOSPITAL_COMMUNITY): Payer: Self-pay | Admitting: Pharmacy Technician

## 2013-07-11 ENCOUNTER — Encounter (HOSPITAL_COMMUNITY)
Admission: RE | Admit: 2013-07-11 | Discharge: 2013-07-11 | Disposition: A | Discharge status: Home / Self Care | Payer: PRIVATE HEALTH INSURANCE | Source: Ambulatory Visit | Attending: Urology | Admitting: Urology

## 2013-07-11 ENCOUNTER — Other Ambulatory Visit: Payer: Self-pay

## 2013-07-11 ENCOUNTER — Encounter (HOSPITAL_COMMUNITY): Payer: Self-pay

## 2013-07-11 ENCOUNTER — Other Ambulatory Visit (HOSPITAL_COMMUNITY): Payer: Self-pay | Admitting: *Deleted

## 2013-07-11 ENCOUNTER — Ambulatory Visit (HOSPITAL_COMMUNITY)
Admission: RE | Admit: 2013-07-11 | Discharge: 2013-07-11 | Disposition: A | Discharge status: Home / Self Care | Payer: PRIVATE HEALTH INSURANCE | Source: Ambulatory Visit | Attending: Urology | Admitting: Urology

## 2013-07-11 DIAGNOSIS — N200 Calculus of kidney: Secondary | ICD-10-CM | POA: Insufficient documentation

## 2013-07-11 DIAGNOSIS — Z01812 Encounter for preprocedural laboratory examination: Secondary | ICD-10-CM | POA: Insufficient documentation

## 2013-07-11 DIAGNOSIS — Z01818 Encounter for other preprocedural examination: Secondary | ICD-10-CM | POA: Insufficient documentation

## 2013-07-11 DIAGNOSIS — Z01810 Encounter for preprocedural cardiovascular examination: Secondary | ICD-10-CM | POA: Insufficient documentation

## 2013-07-11 HISTORY — DX: Unspecified osteoarthritis, unspecified site: M19.90

## 2013-07-11 HISTORY — DX: Gastro-esophageal reflux disease without esophagitis: K21.9

## 2013-07-11 HISTORY — DX: Adverse effect of unspecified anesthetic, initial encounter: T41.45XA

## 2013-07-11 HISTORY — DX: Other complications of anesthesia, initial encounter: T88.59XA

## 2013-07-11 LAB — ABO/RH: ABO/RH(D): O POS

## 2013-07-11 LAB — CBC
HCT: 38.4 % (ref 36.0–46.0)
Hemoglobin: 12.6 g/dL (ref 12.0–15.0)
MCH: 28.3 pg (ref 26.0–34.0)
MCHC: 32.8 g/dL (ref 30.0–36.0)
MCV: 86.3 fL (ref 78.0–100.0)
Platelets: 355 10*3/uL (ref 150–400)
RBC: 4.45 MIL/uL (ref 3.87–5.11)
RDW: 13.5 % (ref 11.5–15.5)
WBC: 9 10*3/uL (ref 4.0–10.5)

## 2013-07-11 LAB — HCG, SERUM, QUALITATIVE: Preg, Serum: NEGATIVE

## 2013-07-11 LAB — BASIC METABOLIC PANEL
BUN: 18 mg/dL (ref 6–23)
CO2: 28 mEq/L (ref 19–32)
Calcium: 9.3 mg/dL (ref 8.4–10.5)
Chloride: 103 mEq/L (ref 96–112)
Creatinine, Ser: 0.77 mg/dL (ref 0.50–1.10)
GFR calc Af Amer: >90 mL/min (ref >90)
GFR calc non Af Amer: >90 mL/min (ref >90)
Glucose, Bld: 92 mg/dL (ref 70–99)
Potassium: 4 mEq/L (ref 3.5–5.1)
Sodium: 138 mEq/L (ref 135–145)

## 2013-07-11 NOTE — Patient Instructions (Signed)
20      Your procedure is scheduled on:  Monday 20/2014  Report to [facility] at  0930 AM.  Call this number if you have problems the night before or morning of surgery:   [phone]   Remember:             IF YOU USE CPAP,BRING MASK AND TUBING AM OF SURGERY!   Do not eat food or drink liquids AFTER MIDNIGHT!  Take these medicines the morning of surgery with A SIP OF WATER: Ativan if needed,Prilosec if needed   Do not bring valuables to the hospital. [facility] IS NOT RESPONSIBLE  FOR ANY BELONGINGS OR VALUABLES BROUGHT TO HOSPITAL.  Marland Kitchen  Leave suitcase in the car. After surgery it may be brought to your room.  For patients admitted to the hospital, checkout time is 11:00 AM the day of              Discharge.    DO NOT WEAR JEWELRY , MAKE-UP, LOTIONS,POWDERS,PERFUMES!             WOMEN -DO NOT SHAVE LEGS OR UNDERARMS 12 HRS. BEFORE  SURGERY!               MEN MAY SHAVE AS USUAL!             CONTACTS,DENTURES OR BRIDGEWORK, FALSE EYELASHES MAY  NOT BE WORN INTO SURGERY!                                           Patients discharged the day of surgery will not be allowed to drive home. If going home the same day of surgery, must have someone stay with you  first 24 hrs.at home and arrange for someone to drive you home from the Hospital.                         YOUR DRIVER ZO:[id number]   Special Instructions:             Please read over the following fact sheets that you were given:             1. [facility] PREPARING FOR SURGERY SHEET              2.[facility]                                        Telford Nab.Sayyid Harewood,RN,BSN     [phone]                FAILURE TO FOLLOW THESE INSTRUCTIONS MAY RESULT IN CANCELLATION OF YOUR SURGERY!               Patient Signature:___________________________

## 2013-07-13 MED ORDER — GENTAMICIN SULFATE 40 MG/ML IJ SOLN
470.0000 mg | INTRAVENOUS | Status: DC
  Filled 2013-07-13: qty 11.75

## 2013-07-14 ENCOUNTER — Encounter (HOSPITAL_COMMUNITY)
Admission: RE | Disposition: A | Discharge status: Home / Self Care | Payer: Self-pay | Source: Ambulatory Visit | Attending: Urology

## 2013-07-14 ENCOUNTER — Inpatient Hospital Stay (HOSPITAL_COMMUNITY): Payer: PRIVATE HEALTH INSURANCE

## 2013-07-14 ENCOUNTER — Inpatient Hospital Stay (HOSPITAL_COMMUNITY): Admission: RE | Admit: 2013-07-14 | Payer: PRIVATE HEALTH INSURANCE | Source: Ambulatory Visit | Admitting: Urology

## 2013-07-14 ENCOUNTER — Inpatient Hospital Stay (HOSPITAL_COMMUNITY)
Admission: RE | Admit: 2013-07-14 | Discharge: 2013-07-17 | DRG: 660 | Disposition: A | Discharge status: Home / Self Care | Payer: PRIVATE HEALTH INSURANCE | Source: Ambulatory Visit | Attending: Urology | Admitting: Urology

## 2013-07-14 ENCOUNTER — Encounter (HOSPITAL_COMMUNITY): Payer: PRIVATE HEALTH INSURANCE | Admitting: Anesthesiology

## 2013-07-14 ENCOUNTER — Inpatient Hospital Stay (HOSPITAL_COMMUNITY): Payer: PRIVATE HEALTH INSURANCE | Admitting: Anesthesiology

## 2013-07-14 ENCOUNTER — Encounter (HOSPITAL_COMMUNITY): Payer: Self-pay | Admitting: *Deleted

## 2013-07-14 DIAGNOSIS — R350 Frequency of micturition: Secondary | ICD-10-CM | POA: Diagnosis present

## 2013-07-14 DIAGNOSIS — N1330 Unspecified hydronephrosis: Secondary | ICD-10-CM | POA: Diagnosis present

## 2013-07-14 DIAGNOSIS — T451X5A Adverse effect of antineoplastic and immunosuppressive drugs, initial encounter: Secondary | ICD-10-CM | POA: Diagnosis present

## 2013-07-14 DIAGNOSIS — Z9221 Personal history of antineoplastic chemotherapy: Secondary | ICD-10-CM

## 2013-07-14 DIAGNOSIS — Z9884 Bariatric surgery status: Secondary | ICD-10-CM

## 2013-07-14 DIAGNOSIS — F411 Generalized anxiety disorder: Secondary | ICD-10-CM | POA: Diagnosis present

## 2013-07-14 DIAGNOSIS — R1900 Intra-abdominal and pelvic swelling, mass and lump, unspecified site: Secondary | ICD-10-CM | POA: Diagnosis present

## 2013-07-14 DIAGNOSIS — I10 Essential (primary) hypertension: Secondary | ICD-10-CM | POA: Diagnosis present

## 2013-07-14 DIAGNOSIS — G620 Drug-induced polyneuropathy: Secondary | ICD-10-CM | POA: Diagnosis present

## 2013-07-14 DIAGNOSIS — N201 Calculus of ureter: Secondary | ICD-10-CM | POA: Diagnosis present

## 2013-07-14 DIAGNOSIS — C189 Malignant neoplasm of colon, unspecified: Secondary | ICD-10-CM | POA: Diagnosis present

## 2013-07-14 DIAGNOSIS — N200 Calculus of kidney: Principal | ICD-10-CM | POA: Diagnosis present

## 2013-07-14 HISTORY — PX: CYSTOSCOPY: SHX5120

## 2013-07-14 HISTORY — PX: NEPHROLITHOTOMY: SHX5134

## 2013-07-14 LAB — HEMOGLOBIN AND HEMATOCRIT, BLOOD
HCT: 34.4 % — ABNORMAL LOW (ref 36.0–46.0)
Hemoglobin: 11.3 g/dL — ABNORMAL LOW (ref 12.0–15.0)

## 2013-07-14 LAB — BASIC METABOLIC PANEL
BUN: 15 mg/dL (ref 6–23)
CO2: 24 mEq/L (ref 19–32)
Calcium: 8.6 mg/dL (ref 8.4–10.5)
Chloride: 105 mEq/L (ref 96–112)
Creatinine, Ser: 0.69 mg/dL (ref 0.50–1.10)
GFR calc Af Amer: >90 mL/min (ref >90)
GFR calc non Af Amer: >90 mL/min (ref >90)
Glucose, Bld: 113 mg/dL — ABNORMAL HIGH (ref 70–99)
Potassium: 3.8 mEq/L (ref 3.5–5.1)
Sodium: 137 mEq/L (ref 135–145)

## 2013-07-14 LAB — TYPE AND SCREEN
ABO/RH(D): O POS
Antibody Screen: NEGATIVE

## 2013-07-14 SURGERY — NEPHROLITHOTOMY PERCUTANEOUS
Anesthesia: General | Laterality: Right | Wound class: Clean Contaminated

## 2013-07-14 MED ORDER — IOHEXOL 300 MG/ML  SOLN
INTRAMUSCULAR | Status: DC | PRN
  Administered 2013-07-14: 50 mL

## 2013-07-14 MED ORDER — ONDANSETRON HCL 4 MG/2ML IJ SOLN: 4.0000 mg | INTRAMUSCULAR | Status: DC | PRN

## 2013-07-14 MED ORDER — GENTAMICIN SULFATE 40 MG/ML IJ SOLN
470.0000 mg | INTRAVENOUS | Status: AC
  Administered 2013-07-14: 470 mg via INTRAVENOUS
  Filled 2013-07-14: qty 11.75

## 2013-07-14 MED ORDER — LIDOCAINE HCL (CARDIAC) 20 MG/ML IV SOLN
INTRAVENOUS | Status: DC | PRN
  Administered 2013-07-14: 50 mg via INTRAVENOUS

## 2013-07-14 MED ORDER — MEPERIDINE HCL 50 MG/ML IJ SOLN: 6.2500 mg | INTRAMUSCULAR | Status: DC | PRN

## 2013-07-14 MED ORDER — PROPOFOL 10 MG/ML IV BOLUS
INTRAVENOUS | Status: DC | PRN
  Administered 2013-07-14: 160 mg via INTRAVENOUS

## 2013-07-14 MED ORDER — EPHEDRINE SULFATE 50 MG/ML IJ SOLN
INTRAMUSCULAR | Status: DC | PRN
  Administered 2013-07-14: 10 mg via INTRAVENOUS
  Administered 2013-07-14 (×2): 5 mg via INTRAVENOUS

## 2013-07-14 MED ORDER — FENTANYL CITRATE 0.05 MG/ML IJ SOLN
INTRAMUSCULAR | Status: DC | PRN
  Administered 2013-07-14: 100 ug via INTRAVENOUS

## 2013-07-14 MED ORDER — ONDANSETRON HCL 4 MG/2ML IJ SOLN
INTRAMUSCULAR | Status: DC | PRN
  Administered 2013-07-14: 4 mg via INTRAMUSCULAR

## 2013-07-14 MED ORDER — INFLUENZA VAC SPLIT QUAD 0.5 ML IM SUSP
0.5000 mL | INTRAMUSCULAR | Status: DC
  Filled 2013-07-14 (×2): qty 0.5

## 2013-07-14 MED ORDER — HYDROMORPHONE HCL PF 1 MG/ML IJ SOLN
INTRAMUSCULAR | Status: DC | PRN
  Administered 2013-07-14 (×2): 0.5 mg via INTRAVENOUS
  Administered 2013-07-14: 1 mg via INTRAVENOUS

## 2013-07-14 MED ORDER — OXYCODONE HCL 5 MG PO TABS
5.0000 mg | ORAL_TABLET | ORAL | Status: DC | PRN
  Administered 2013-07-14 – 2013-07-15 (×2): 5 mg via ORAL
  Filled 2013-07-14 (×2): qty 1

## 2013-07-14 MED ORDER — KCL IN DEXTROSE-NACL 20-5-0.45 MEQ/L-%-% IV SOLN
INTRAVENOUS | Status: DC
  Administered 2013-07-14 – 2013-07-17 (×6): via INTRAVENOUS
  Filled 2013-07-14 (×7): qty 1000

## 2013-07-14 MED ORDER — SENNA 8.6 MG PO TABS
1.0000 | ORAL_TABLET | Freq: Two times a day (BID) | ORAL | Status: DC
  Administered 2013-07-14 – 2013-07-17 (×5): 8.6 mg via ORAL
  Filled 2013-07-14 (×5): qty 1

## 2013-07-14 MED ORDER — FENTANYL CITRATE 0.05 MG/ML IJ SOLN: 25.0000 ug | INTRAMUSCULAR | Status: DC | PRN

## 2013-07-14 MED ORDER — DOCUSATE SODIUM 100 MG PO CAPS
100.0000 mg | ORAL_CAPSULE | Freq: Two times a day (BID) | ORAL | Status: DC
  Administered 2013-07-14 – 2013-07-17 (×6): 100 mg via ORAL
  Filled 2013-07-14 (×7): qty 1

## 2013-07-14 MED ORDER — ACETAMINOPHEN 10 MG/ML IV SOLN
1000.0000 mg | Freq: Three times a day (TID) | INTRAVENOUS | Status: AC
Start: 2013-07-14 — End: 2013-07-15
  Administered 2013-07-14 – 2013-07-15 (×3): 1000 mg via INTRAVENOUS
  Filled 2013-07-14 (×3): qty 100

## 2013-07-14 MED ORDER — ROCURONIUM BROMIDE 100 MG/10ML IV SOLN
INTRAVENOUS | Status: DC | PRN
  Administered 2013-07-14: 20 mg via INTRAVENOUS
  Administered 2013-07-14: 50 mg via INTRAVENOUS

## 2013-07-14 MED ORDER — 0.9 % SODIUM CHLORIDE (POUR BTL) OPTIME
TOPICAL | Status: DC | PRN
  Administered 2013-07-14: 1000 mL

## 2013-07-14 MED ORDER — LORAZEPAM 0.5 MG PO TABS
0.5000 mg | ORAL_TABLET | Freq: Three times a day (TID) | ORAL | Status: DC | PRN
  Administered 2013-07-14 – 2013-07-16 (×4): 0.5 mg via ORAL
  Filled 2013-07-14 (×4): qty 1

## 2013-07-14 MED ORDER — GLYCOPYRROLATE 0.2 MG/ML IJ SOLN
INTRAMUSCULAR | Status: DC | PRN
  Administered 2013-07-14: 0.6 mg via INTRAVENOUS

## 2013-07-14 MED ORDER — LACTATED RINGERS IV SOLN
INTRAVENOUS | Status: DC
Start: 2013-07-14 — End: 2013-07-14
  Administered 2013-07-14 (×3): via INTRAVENOUS

## 2013-07-14 MED ORDER — PANTOPRAZOLE SODIUM 40 MG PO TBEC
40.0000 mg | DELAYED_RELEASE_TABLET | Freq: Every day | ORAL | Status: DC
  Administered 2013-07-14 – 2013-07-17 (×4): 40 mg via ORAL
  Filled 2013-07-14 (×5): qty 1

## 2013-07-14 MED ORDER — NEOSTIGMINE METHYLSULFATE 1 MG/ML IJ SOLN
INTRAMUSCULAR | Status: DC | PRN
  Administered 2013-07-14: 4 mg via INTRAVENOUS

## 2013-07-14 MED ORDER — HYDROMORPHONE HCL PF 1 MG/ML IJ SOLN
0.5000 mg | INTRAMUSCULAR | Status: DC | PRN
  Administered 2013-07-14 – 2013-07-17 (×11): 1 mg via INTRAVENOUS
  Filled 2013-07-14 (×12): qty 1

## 2013-07-14 MED ORDER — DEXAMETHASONE SODIUM PHOSPHATE 10 MG/ML IJ SOLN
INTRAMUSCULAR | Status: DC | PRN
  Administered 2013-07-14: 10 mg via INTRAVENOUS

## 2013-07-14 MED ORDER — MIDAZOLAM HCL 5 MG/5ML IJ SOLN
INTRAMUSCULAR | Status: DC | PRN
  Administered 2013-07-14: 2 mg via INTRAVENOUS

## 2013-07-14 MED ORDER — PROMETHAZINE HCL 25 MG/ML IJ SOLN: 6.2500 mg | INTRAMUSCULAR | Status: DC | PRN

## 2013-07-14 MED ORDER — SODIUM CHLORIDE 0.9 % IR SOLN
Status: DC | PRN
  Administered 2013-07-14: 13000 mL

## 2013-07-14 SURGICAL SUPPLY — 62 items
APL SKNCLS STERI-STRIP NONHPOA (GAUZE/BANDAGES/DRESSINGS) ×2
BAG URINE DRAINAGE (UROLOGICAL SUPPLIES) ×6 IMPLANT
BASKET ZERO TIP NITINOL 2.4FR (BASKET) ×4 IMPLANT
BENZOIN TINCTURE PRP APPL 2/3 (GAUZE/BANDAGES/DRESSINGS) ×4 IMPLANT
BLADE SURG 15 STRL LF DISP TIS (BLADE) ×2 IMPLANT
BLADE SURG 15 STRL SS (BLADE) ×3
BSKT STON RTRVL ZERO TP 2.4FR (BASKET) ×4
CATCHER STONE W/TUBE ADAPTER (UROLOGICAL SUPPLIES) ×2 IMPLANT
CATH BEACON 5.038 65CM KMP-01 (CATHETERS) ×3 IMPLANT
CATH FOLEY 2W COUNCIL 20FR 5CC (CATHETERS) IMPLANT
CATH FOLEY 2WAY SLVR  5CC 16FR (CATHETERS)
CATH FOLEY 2WAY SLVR  5CC 18FR (CATHETERS) ×1
CATH FOLEY 2WAY SLVR 5CC 16FR (CATHETERS) ×1 IMPLANT
CATH FOLEY 2WAY SLVR 5CC 18FR (CATHETERS) ×1 IMPLANT
CATH INTERMIT  6FR 70CM (CATHETERS) ×3 IMPLANT
CATH MULTI PURPOSE 16FR DRAIN (STENTS) ×2 IMPLANT
CATH ROBINSON RED A/P 20FR (CATHETERS) IMPLANT
CATH X-FORCE N30 NEPHROSTOMY (TUBING) ×3 IMPLANT
CHLORAPREP W/TINT 26ML (MISCELLANEOUS) ×6 IMPLANT
CLOTH BEACON ORANGE TIMEOUT ST (SAFETY) ×3 IMPLANT
COVER SURGICAL LIGHT HANDLE (MISCELLANEOUS) ×3 IMPLANT
DRAPE C-ARM 42X120 X-RAY (DRAPES) ×3 IMPLANT
DRAPE CAMERA CLOSED 9X96 (DRAPES) ×6 IMPLANT
DRAPE LG THREE QUARTER DISP (DRAPES) ×3 IMPLANT
DRAPE LINGEMAN PERC (DRAPES) ×3 IMPLANT
DRAPE SURG IRRIG POUCH 19X23 (DRAPES) ×3 IMPLANT
DRSG PAD ABDOMINAL 8X10 ST (GAUZE/BANDAGES/DRESSINGS) ×1 IMPLANT
DRSG TEGADERM 8X12 (GAUZE/BANDAGES/DRESSINGS) ×4 IMPLANT
FIBER LASER FLEXIVA 550 (UROLOGICAL SUPPLIES) IMPLANT
GLOVE BIOGEL M STRL SZ7.5 (GLOVE) ×9 IMPLANT
GOWN STRL REIN XL XLG (GOWN DISPOSABLE) ×9 IMPLANT
GUIDEWIRE AMPLAZ .035X145 (WIRE) ×6 IMPLANT
GUIDEWIRE ANG ZIPWIRE 038X150 (WIRE) ×6 IMPLANT
GUIDEWIRE STR DUAL SENSOR (WIRE) IMPLANT
IV SET MACRO CATH EXT 6 LUER (IV SETS) ×2 IMPLANT
KIT BASIN OR (CUSTOM PROCEDURE TRAY) ×3 IMPLANT
LASER FIBER DISP 1000U (UROLOGICAL SUPPLIES) IMPLANT
MANIFOLD NEPTUNE II (INSTRUMENTS) ×3 IMPLANT
NDL TROCAR 18X15 ECHO (NEEDLE) IMPLANT
NDL TROCAR 18X20 (NEEDLE) IMPLANT
NEEDLE TROCAR 18X15 ECHO (NEEDLE) ×3 IMPLANT
NEEDLE TROCAR 18X20 (NEEDLE) IMPLANT
NS IRRIG 1000ML POUR BTL (IV SOLUTION) ×3 IMPLANT
PACK BASIC VI WITH GOWN DISP (CUSTOM PROCEDURE TRAY) ×3 IMPLANT
PACK CYSTO (CUSTOM PROCEDURE TRAY) ×3 IMPLANT
PAD ABD 7.5X8 STRL (GAUZE/BANDAGES/DRESSINGS) ×4 IMPLANT
PROBE LITHOCLAST ULTRA 3.8X403 (UROLOGICAL SUPPLIES) ×1 IMPLANT
PROBE PNEUMATIC 1.0MMX570MM (UROLOGICAL SUPPLIES) ×3 IMPLANT
SET IRRIG Y TYPE TUR BLADDER L (SET/KITS/TRAYS/PACK) ×3 IMPLANT
SET WARMING FLUID IRRIGATION (MISCELLANEOUS) IMPLANT
SHEATH PEELAWAY SET 9 (SHEATH) ×1 IMPLANT
SPONGE GAUZE 4X4 12PLY (GAUZE/BANDAGES/DRESSINGS) ×3 IMPLANT
SPONGE LAP 4X18 X RAY DECT (DISPOSABLE) ×3 IMPLANT
STONE CATCHER W/TUBE ADAPTER (UROLOGICAL SUPPLIES) ×3 IMPLANT
SUT SILK 2 0 30  PSL (SUTURE)
SUT SILK 2 0 30 PSL (SUTURE) ×2 IMPLANT
SYR 20CC LL (SYRINGE) ×6 IMPLANT
SYRINGE 10CC LL (SYRINGE) ×3 IMPLANT
TOWEL OR 17X26 10 PK STRL BLUE (TOWEL DISPOSABLE) ×3 IMPLANT
TUBE CONNECTING VINYL 14FR 30C (MISCELLANEOUS) ×1 IMPLANT
TUBING CONNECTING 10 (TUBING) ×9 IMPLANT
WATER STERILE IRR 1500ML POUR (IV SOLUTION) ×2 IMPLANT

## 2013-07-14 NOTE — H&P (Signed)
Carmen Buchanan is an 57 y.o. female.    Chief Complaint: Pre-OP Right Percutaneous Nephrostolithotomy  HPI:   1 - Gross Hematuria - Pt with few episodes grossly blood urine on/off x few mos. Non-smoker. No textile / chemical exposure. CT Urogram 05/2013 with Rt partial staghorn stone. Cysto deferred to time of stone surgery.   2 - Rt Partial Staghorn Stone - Pt with rt partial staghorn by CT on w/u intermitiant rt sided low back pain and hematuria. Some moderate hydro. No contralateral stones. No prior stone evants. No recurent UTI.   PMH sig for obesity s/p gastric sleve (90lb initial loss, now aprox stable), L-Spine surger (some residual LLE numbness). No CV disease. No strong blood thinners.   Today Carmen Buchanan is seen to proceed with right percutaneous nephrostolithotomy. Most recent UCX very small non-specific growth, no interval fevers.   Past Medical History  Diagnosis Date  . Obesity   . IBS (irritable bowel syndrome)     intermittent  . Migraine, unspecified, without mention of intractable migraine without mention of status migrainosus   . Anxiety     much improved after weight loss started in late 2012  . Hemorrhoid   . Complication of anesthesia     after hemorrhoid surgery-inability to sleep for 3 weeks  . GERD (gastroesophageal reflux disease)   . Arthritis   . HTN (hypertension)     stopped meds after bariatric surgery    Past Surgical History  Procedure Laterality Date  . Tonsillectomy and adenoidectomy  1970  . Laser ablation condyloma cervical / vulvar  5/04    uncontrollable bleeding  . Cesarean section    . Lumbar disc surgery  7/01    L4-5; Dr. Elesa Hacker  . Gastric bypass      06/2011 in [facility]    Family History  Problem Relation Age of Onset  . Coronary artery disease Father     MI; CABG  . Aortic aneurysm Mother     on betablocker  . Other Mother     Cardiovascualr disease  . Ulcers Mother   . Colon polyps      Family history   Social History:   reports that she has never smoked. She does not have any smokeless tobacco history on file. She reports that she drinks alcohol. She reports that she does not use illicit drugs.  Allergies:  Allergies  Allergen Reactions  . Ciprofloxacin     REACTION: nausea  . Morphine     REACTION: HA and NAV, but tolerates hydrocodone  . Nsaids     Due to gastric bypass    No prescriptions prior to admission    No results found for this or any previous visit (from the past 48 hour(s)). No results found.  Review of Systems  Constitutional: Negative.  Negative for fever and chills.  HENT: Negative.   Respiratory: Negative.   Cardiovascular: Negative.   Gastrointestinal: Negative.   Genitourinary: Negative.   Musculoskeletal: Negative.   Skin: Negative.   Neurological: Negative.   Endo/Heme/Allergies: Negative.   Psychiatric/Behavioral: Negative.     There were no vitals taken for this visit. Physical Exam  Constitutional: She is oriented to person, place, and time. She appears well-developed and well-nourished.  HENT:  Head: Normocephalic and atraumatic.  Eyes: EOM are normal. Pupils are equal, round, and reactive to light.  Neck: Normal range of motion. Neck supple.  Cardiovascular: Normal rate and regular rhythm.   Respiratory: Effort normal.  GI: Soft.  Bowel sounds are normal.  Musculoskeletal: Normal range of motion.  Neurological: She is alert and oriented to person, place, and time.  Skin: Skin is warm and dry.  Psychiatric: She has a normal mood and affect. Her behavior is normal. Judgment and thought content normal.     Assessment/Plan  1 - Gross Hematuria - eval with labs, exam, CT, c with large voluem nephrolithiasis as likely cause. Will perform cysto at time of PCNL to help r/o small bladder lesions, though this is felt to be much less likley.  2 - Rt Partial Staghorn Stone - We rediscussed percutaneous nephrostolithotomy (PCNL) in detail including need for  percutaneous access which is sometimes gained by the surgeon, and other times by radiology or through existing nephrostomy tubes if present. We specifically rediscussed that often times tubes remain in after surgery until we are confident all stone has been treated. We mentioned that staged surgery is needed in over 50% of cases of very large or complex stone. We then rediscussed general risks including bleeding, infection, damage to kidney / ureter / bladder, loss of kidney, as well as anesthetic risks and rare but serious surgical complications including DVT, PE, MI, and mortality.  Proceed today as planned. Vali Capano 07/14/2013, 8:27 AM

## 2013-07-14 NOTE — OR Nursing (Signed)
Right renal stones sent with Dr. Berneice Heinrich.

## 2013-07-14 NOTE — Anesthesia Postprocedure Evaluation (Signed)
Anesthesia Post Note  Patient: Carmen Buchanan  Procedure(s) Performed: Procedure(s) (LRB): NEPHROLITHOTOMY PERCUTANEOUS  Procedure: Right 1st stage Percutaneous Nephrostolithotomy with surgeon access and cystoscopy (Right) CYSTOSCOPY (N/A)  Anesthesia type: General  Patient location: PACU  Post pain: Pain level controlled  Post assessment: Post-op Vital signs reviewed  Last Vitals: BP 124/65  Pulse 61  Temp(Src) 36.7 C (Oral)  Resp 18  Ht 5\' 9"  (1.753 m)  Wt 212 lb (96.163 kg)  BMI 31.29 kg/m2  SpO2 100%  Post vital signs: Reviewed  Level of consciousness: sedated  Complications: No apparent anesthesia complications

## 2013-07-14 NOTE — Brief Op Note (Signed)
07/14/2013  2:33 PM  PATIENT:  Carmen Buchanan  57 y.o. female  PRE-OPERATIVE DIAGNOSIS:  RIGHT PARTIAL STAGHORN KIDNEY STONE  POST-OPERATIVE DIAGNOSIS:  RIGHT PARTIAL STAGHORN KIDNEY STONE  PROCEDURE:  Procedure(s): NEPHROLITHOTOMY PERCUTANEOUS  Procedure: Right 1st stage Percutaneous Nephrostolithotomy with surgeon access and cystoscopy (Right) CYSTOSCOPY (N/A)  SURGEON:  Surgeon(s) and Role:    * Sebastian Ache, MD - Primary  PHYSICIAN ASSISTANT:   ASSISTANTS: none   ANESTHESIA:   general  EBL:  Total I/O In: 2000 [I.V.:2000] Out: -   BLOOD ADMINISTERED:none  DRAINS: 1 - Rt 23F nephrostomy, 2 - Rt Nephroureteral Stent, 3 - Foley Catheter   LOCAL MEDICATIONS USED:  NONE  SPECIMEN:  Source of Specimen:  Rt Renal and Ureteral Stones for compositional analysis  DISPOSITION OF SPECIMEN:  [facility]  COUNTS:  YES  TOURNIQUET:  * No tourniquets in log *  DICTATION: .Other Dictation: Dictation Number  [id number]  PLAN OF CARE: Admit to inpatient   PATIENT DISPOSITION:  PACU - hemodynamically stable.   Delay start of Pharmacological VTE agent (>24hrs) due to surgical blood loss or risk of bleeding: yes

## 2013-07-14 NOTE — Transfer of Care (Signed)
Immediate Anesthesia Transfer of Care Note  Patient: Carmen Buchanan  Procedure(s) Performed: Procedure(s): NEPHROLITHOTOMY PERCUTANEOUS  Procedure: Right 1st stage Percutaneous Nephrostolithotomy with surgeon access and cystoscopy (Right) CYSTOSCOPY (N/A)  Patient Location: PACU  Anesthesia Type:General  Level of Consciousness: awake, alert  and oriented  Airway & Oxygen Therapy: Patient Spontanous Breathing and Patient connected to face mask oxygen  Post-op Assessment: Report given to PACU RN and Post -op Vital signs reviewed and stable  Post vital signs: Reviewed and stable  Complications: No apparent anesthesia complications

## 2013-07-14 NOTE — Anesthesia Preprocedure Evaluation (Addendum)
Anesthesia Evaluation  Patient identified by MRN, date of birth, ID band Patient awake    Reviewed: Allergy & Precautions, H&P , NPO status , Patient's Chart, lab work & pertinent test results  History of Anesthesia Complications Negative for: history of anesthetic complications  Airway Mallampati: II TM Distance: >3 FB Neck ROM: Full    Dental  (+) Dental Advisory Given   Pulmonary neg pulmonary ROS,  breath sounds clear to auscultation        Cardiovascular hypertension, Pt. on medications Rhythm:Regular Rate:Normal     Neuro/Psych  Headaches, PSYCHIATRIC DISORDERS Anxiety    GI/Hepatic Neg liver ROS, GERD-  Medicated,  Endo/Other  negative endocrine ROS  Renal/GU Renal disease     Musculoskeletal negative musculoskeletal ROS (+)   Abdominal   Peds  Hematology negative hematology ROS (+)   Anesthesia Other Findings   Reproductive/Obstetrics negative OB ROS                         Anesthesia Physical Anesthesia Plan  ASA: III  Anesthesia Plan: General   Post-op Pain Management:    Induction: Intravenous  Airway Management Planned: Oral ETT  Additional Equipment:   Intra-op Plan:   Post-operative Plan: Extubation in OR  Informed Consent: I have reviewed the patients History and Physical, chart, labs and discussed the procedure including the risks, benefits and alternatives for the proposed anesthesia with the patient or authorized representative who has indicated his/her understanding and acceptance.   Dental advisory given  Plan Discussed with: CRNA  Anesthesia Plan Comments:        Anesthesia Quick Evaluation

## 2013-07-14 NOTE — Progress Notes (Signed)
Pt arrived to unit on stretcher, A&Ox4. Slid self to bed independantly. Dsg to rt flank c/d/i w/ nephrostomy tube draining red clear fluid. Foley w/ bloody urine draining. Pt oriented to callbell and environment. POC discussed w/ pt and family. Denies pain/nausea at present.

## 2013-07-15 ENCOUNTER — Encounter (HOSPITAL_COMMUNITY): Payer: Self-pay | Admitting: Urology

## 2013-07-15 LAB — BASIC METABOLIC PANEL
BUN: 13 mg/dL (ref 6–23)
CO2: 25 mEq/L (ref 19–32)
Calcium: 9.3 mg/dL (ref 8.4–10.5)
Chloride: 101 mEq/L (ref 96–112)
Creatinine, Ser: 0.78 mg/dL (ref 0.50–1.10)
GFR calc Af Amer: >90 mL/min (ref >90)
GFR calc non Af Amer: >90 mL/min (ref >90)
Glucose, Bld: 137 mg/dL — ABNORMAL HIGH (ref 70–99)
Potassium: 4.2 mEq/L (ref 3.5–5.1)
Sodium: 134 mEq/L — ABNORMAL LOW (ref 135–145)

## 2013-07-15 LAB — HEMOGLOBIN AND HEMATOCRIT, BLOOD
HCT: 33.1 % — ABNORMAL LOW (ref 36.0–46.0)
Hemoglobin: 10.9 g/dL — ABNORMAL LOW (ref 12.0–15.0)

## 2013-07-15 MED ORDER — POLYETHYLENE GLYCOL 3350 17 G PO PACK
17.0000 g | PACK | Freq: Every day | ORAL | Status: DC
  Administered 2013-07-15 – 2013-07-16 (×2): 17 g via ORAL
  Filled 2013-07-15 (×3): qty 1

## 2013-07-15 MED ORDER — GENTAMICIN SULFATE 40 MG/ML IJ SOLN
5.0000 mg/kg | Freq: Once | INTRAVENOUS | Status: AC
  Administered 2013-07-16: 390 mg via INTRAVENOUS
  Filled 2013-07-15: qty 9.75

## 2013-07-15 MED ORDER — INFLUENZA VAC SPLIT QUAD 0.5 ML IM SUSP
0.5000 mL | INTRAMUSCULAR | Status: AC
  Administered 2013-07-17: 0.5 mL via INTRAMUSCULAR
  Filled 2013-07-15 (×2): qty 0.5

## 2013-07-15 NOTE — Op Note (Signed)
[name], [name]                ACCOUNT NO.:  [id number]  MEDICAL RECORD NO.:  [id number]  LOCATION:  1423                         FACILITY:  [facility]  PHYSICIAN:  Sebastian Ache, MD     DATE OF BIRTH:  08-12-1956  DATE OF PROCEDURE:07/14/2013 DATE OF DISCHARGE:                              OPERATIVE REPORT   DIAGNOSIS:  Right partial staghorn kidney stone.  PROCEDURES: 1. Cystoscopy with right retrograde pyelogram and interpretation. 2. Right first stage percutaneous nephrostolithotomy stone, greater     than 2 cm. 3. Needle access to right kidney with dilation. 4. Placement of right nephrostomy tube. 5. Placement of right ureteral stent. 6. Ureteroscopy with basketing of right ureteral stone. 7. Right antegrade nephrostogram with interpretation.  ESTIMATED BLOOD LOSS:  Nil.  COMPLICATIONS:  None.  FINDINGS: 1. Impacted right distal ureteral stone.  This was grasped and brought     out in its entirety. 2. Mild hydroureteronephrosis to the right distal ureter. 3. Multifocal right renal stone.  Dominant stone in renal pelvis     consistent with partial staghorn, total stone volume approximately     2.5 cm.  DRAINS: 1. Right nephroureteral stent, capped. 2. Right 16-French nephrostomy tube, straight drain. 3. Foley catheter to straight drain.  SPECIMEN:  Right renal and ureteral stones for compositional analysis.  INDICATIONS:  Carmen Buchanan is a pleasant 57 year old lady with history of morbid obesity, status post gastric bypass surgery.  Then, subsequently had significant large volume right nephrolithiasis including partial staghorn kidney stone on the right.  This was found on workup of gross hematuria and flank pain and most recent urine is now more worrisome for infectious parameters.  Options were discussed including observation versus medical therapy versus staged ureteroscopy versus percutaneous nephrostolithotomy, possibly stage with goal of stone drainage.   She wished to proceed with the latter.  Informed consent was obtained and placed in the medical record.  PROCEDURE IN DETAIL:  The patient being Carmen Buchanan, was verified.  The procedure being right first-stage percutaneous nephrostolithotomy was confirmed.  Procedure was carried out.  Time-out was performed. Intravenous antibiotics were administered.  General tracheal anesthesia was introduced.  The patient was placed into a low-lithotomy position. Sterile field was created by prepping and draping the patient's vagina, introitus, and proximal thighs using iodine x3.  Next, cystourethroscopy was performed using a 22-French rigid cystoscope with 12-degree offset lens.  Inspection of urinary bladder revealed a crowding right distal ureteral stone.  This appeared to be amenable to basketing.  As such, the ZeroTip basket was passed alongside this, opened and the stone was grasped and brought out, and set aside for compositional analysis. There were several lesser fragments that came through the largest approximately 6 mm.  Next, the right ureteral orifice was cannulated with 6-French end-hole catheter and right retrograde pyelogram was obtained.  Right retrograde pyelogram demonstrated a single right ureter, single- system right kidney without significant filling defects or narrowing. There was a mild hydroureteronephrosis to the level of the distal ureter.  This was navigated to the level of the renal pelvis using continuous fluoroscopic guidance, and set aside as nephroureteral catheter.  A 16-French Foley was placed per  urethra to straight drain and 10 mL sterile water in the balloon.  This was fashioned into the nephroureteral catheter as was the syringe containing approximately 50 mL of iodinated contrast.  The patient was then flipped into a full prone position employing slight table flexion, ProneView sequential compression devices, axillary rolls bilaterally.  A new sterile  field was created by prepping and draping the patient's entire right flank as well as the previous nephroureteral stent into a new operative field. Next, using simultaneous retrograde pyelography via the nephroureteral stent and fluoroscopy, a 20 degrees of center, a suitable lower pole calyx was found for percutaneous access.  It was corresponded to what appeared to be the best access position under CT scan.  An 18-gauge Chiba needle was then used with a Pull-By technique to cannulate the lower pole calyx.  A 0.038 Glidewire was then taped, navigated, coiled in the upper pole, over which, angiographic catheter was then carefully advanced using the angiographic catheters angulation.  The glidewire was navigated down the ureter to the level of the urinary bladder and exchanged for a Super-Stiff wire via the angiographic catheter.  This site was then diverse further the fascia using hemostats and one 0.5 cm incision was made at the level of the skin over this.  The dual-lumen introducer was navigated to the proximal ureter using continuous fluoroscopic guidance and allowing passage of the Glidewire once again down the ureter to the level of the bladder, which was then exchanged for Super-Stiff wire via the angiographic catheter, having two wires through and through access to the urinary bladder.  A percutaneous drape was then applied and the 26-French NephroMax balloon dilation apparatus was carefully positioned across the lower pole calyx, inflated to a pressure of 18 atmospheres, held for 90 seconds and the sheath was placed across this.  Next, rigid nephroscopy was performed.  This revealed excellent placement of the sheath.  A rigid inspection of the lower pole and renal pelvis did not immediately reveal significant stone, it was felt that these were likely within calices.  As such, flexible endoscopy was performed and indeed the large dominant stone was found in the mid pole calyx and  repositioned into the renal pelvis, which then allowed lithoclast, dual ultrasound pneumatic energy to be applied to the stone Fragmenting approximately 90% of it.  Next, flexible technique was then used to grasp the remaining stones and then brought out their entirety, set aside for compositional analysis.  There were many small fragments.  The renal anatomy was also somewhat complex with many acutely angled calices along the level of the tract.  Level of the proximal ureter was inspected via this technique and no large fragments were seen here.  Given through the significant number of fragments and acutely angled calices, it was felt that it would be most prudent to proceed with second-stage procedure on Wednesday, previously scheduled to verify stone free status, this will be discussed with the patient.  Additional Glidewire was advanced down to the level of the ureter, over which, the angiographic catheter was then placed and I think it was an externalized nephroureteral stent, this was capped.  A 16-French nephrostomy tube was then placed over the remaining Super- Stiff wire and coiled in the level of the renal pelvis.  Final antegrade nephrostogram revealed excellent placement of nephrostomy tube within the renal pelvis.  No excessive extravasation of contrast.  The sheath was removed.  Drain was sutured in place.  Percutaneous dressing was applied.  Procedure was  then terminated.  The patient tolerated the procedure well.  There were no immediate periprocedural complications.  The patient was taken to the postanesthesia care unit in stable condition.          ______________________________ Sebastian Ache, MD     TM/MEDQ  D:  07/14/2013  T:  07/15/2013  Job:  [id number]

## 2013-07-15 NOTE — Progress Notes (Signed)
1 Day Post-Op  Subjective:  1 - Rt Partial Staghorn Stone - s/p 1st stage percutaneous nephrostolithtomy 07/14/2013. Plan for second stage 10/22.   Today Carmen Buchanan is doing well POD 1. No fevers / nausea. No tube problems.   Objective: Vital signs in last 24 hours: Temp:  [97.7 F (36.5 C)-98.1 F (36.7 C)] 98.1 F (36.7 C) (10/21 0609) Pulse Rate:  [57-73] 73 (10/21 0609) Resp:  [6-20] 18 (10/21 0609) BP: (101-145)/(54-86) 105/54 mmHg (10/21 0609) SpO2:  [95 %-100 %] 96 % (10/21 0609) Weight:  [96.163 kg (212 lb)] 96.163 kg (212 lb) (10/20 1644) Last BM Date: 07/14/13  Intake/Output from previous day: 10/20 0701 - 10/21 0700 In: 3460 [P.O.:360; I.V.:3000; IV Piggyback:100] Out: 2435 [Urine:2435] Intake/Output this shift:    General appearance: alert, cooperative and appears stated age Head: Normocephalic, without obvious abnormality, atraumatic Eyes: conjunctivae/corneas clear. PERRL, EOM's intact. Fundi benign. Ears: normal TM's and external ear canals both ears Nose: Nares normal. Septum midline. Mucosa normal. No drainage or sinus tenderness. Throat: lips, mucosa, and tongue normal; teeth and gums normal Neck: no adenopathy, no carotid bruit, no JVD, supple, symmetrical, trachea midline and thyroid not enlarged, symmetric, no tenderness/mass/nodules Back: symmetric, no curvature. ROM normal. No CVA tenderness. Resp: clear to auscultation bilaterally Cardio: regular rate and rhythm, S1, S2 normal, no murmur, click, rub or gallop GI: soft, non-tender; bowel sounds normal; no masses,  no organomegaly Pelvic: external genitalia normal and Foley c/d/i with light pink urine Extremities: extremities normal, atraumatic, no cyanosis or edema Pulses: 2+ and symmetric Skin: Skin color, texture, turgor normal. No rashes or lesions Lymph nodes: Cervical, supraclavicular, and axillary nodes normal. Neurologic: Grossly normal Incision/Wound: Rt Neph tube site c/d/i with pink urine in  collection bag. Nephroureteral stent capped.   Lab Results:   Recent Labs  07/14/13 1509 07/15/13 0416  HGB 11.3* 10.9*  HCT 34.4* 33.1*   BMET  Recent Labs  07/14/13 1509 07/15/13 0416  NA 137 134*  K 3.8 4.2  CL 105 101  CO2 24 25  GLUCOSE 113* 137*  BUN 15 13  CREATININE 0.69 0.78  CALCIUM 8.6 9.3   PT/INR No results found for this basename: LABPROT, INR,  in the last 72 hours ABG No results found for this basename: PHART, PCO2, PO2, HCO3,  in the last 72 hours  Studies/Results: Dg Abd 1 View  07/14/2013   CLINICAL DATA:  History of right percutaneous nephrostomy and stent placement for removal of staghorn calculus.  EXAM: ABDOMEN - 1 VIEW  COMPARISON:  CT 06/10/2013 at [facility]  FINDINGS: Intraprocedural images demonstrate opacification of the right renal collecting system with percutaneous approach and ureteral stent placement and nephrostomy tube placement. Subjective mild narrowing at the right UPJ is noted.  IMPRESSION: Intraprocedural imaging as above.   Electronically Signed   By: Christiana Pellant M.D.   On: 07/14/2013 19:32   Dg C-arm 61-120 Min-no Report  07/14/2013   CLINICAL DATA: kidney stone   C-ARM 61-120 MINUTES  Fluoroscopy was utilized by the requesting physician.  No radiographic  interpretation.     Anti-infectives: Anti-infectives   Start     Dose/Rate Route Frequency Ordered Stop   07/14/13 1242  gentamicin (GARAMYCIN) 470 mg in dextrose 5 % 100 mL IVPB     470 mg 111.8 mL/hr over 60 Minutes Intravenous 60 min pre-op 07/14/13 1242 07/14/13 1217      Assessment/Plan:  1 - Rt Partial Staghorn Stone - doing well POD1.  Will proceed tomorrow as planned for second stage procedure given stone multifocality. NPO p MN, consent. Beign nightly miralax per pt request.   Carmen Buchanan, Carmen Buchanan 07/15/2013

## 2013-07-16 ENCOUNTER — Inpatient Hospital Stay (HOSPITAL_COMMUNITY): Payer: PRIVATE HEALTH INSURANCE

## 2013-07-16 ENCOUNTER — Encounter (HOSPITAL_COMMUNITY): Payer: PRIVATE HEALTH INSURANCE | Admitting: Certified Registered"

## 2013-07-16 ENCOUNTER — Inpatient Hospital Stay (HOSPITAL_COMMUNITY): Payer: PRIVATE HEALTH INSURANCE | Admitting: Certified Registered"

## 2013-07-16 ENCOUNTER — Encounter (HOSPITAL_COMMUNITY): Payer: Self-pay | Admitting: Anesthesiology

## 2013-07-16 ENCOUNTER — Encounter (HOSPITAL_COMMUNITY)
Admission: RE | Disposition: A | Discharge status: Home / Self Care | Payer: Self-pay | Source: Ambulatory Visit | Attending: Urology

## 2013-07-16 HISTORY — PX: NEPHROLITHOTOMY: SHX5134

## 2013-07-16 LAB — SURGICAL PCR SCREEN
MRSA, PCR: NEGATIVE
Staphylococcus aureus: NEGATIVE

## 2013-07-16 SURGERY — NEPHROLITHOTOMY PERCUTANEOUS SECOND LOOK
Anesthesia: General | Laterality: Right | Wound class: Clean Contaminated

## 2013-07-16 MED ORDER — PROPOFOL 10 MG/ML IV BOLUS
INTRAVENOUS | Status: DC | PRN
  Administered 2013-07-16: 160 mg via INTRAVENOUS

## 2013-07-16 MED ORDER — MEPERIDINE HCL 25 MG/ML IJ SOLN
6.2500 mg | INTRAMUSCULAR | Status: DC | PRN
  Administered 2013-07-16: 12.5 mg via INTRAVENOUS

## 2013-07-16 MED ORDER — FENTANYL CITRATE 0.05 MG/ML IJ SOLN
INTRAMUSCULAR | Status: DC | PRN
  Administered 2013-07-16: 100 ug via INTRAVENOUS
  Administered 2013-07-16 (×3): 50 ug via INTRAVENOUS

## 2013-07-16 MED ORDER — PROMETHAZINE HCL 25 MG/ML IJ SOLN: 6.2500 mg | INTRAMUSCULAR | Status: DC | PRN

## 2013-07-16 MED ORDER — LIDOCAINE HCL (CARDIAC) 20 MG/ML IV SOLN
INTRAVENOUS | Status: DC | PRN
  Administered 2013-07-16: 50 mg via INTRAVENOUS

## 2013-07-16 MED ORDER — SODIUM CHLORIDE 0.9 % IR SOLN
Status: DC | PRN
  Administered 2013-07-16: 6000 mL

## 2013-07-16 MED ORDER — GLYCOPYRROLATE 0.2 MG/ML IJ SOLN
INTRAMUSCULAR | Status: DC | PRN
  Administered 2013-07-16: .6 mg via INTRAVENOUS

## 2013-07-16 MED ORDER — NEOSTIGMINE METHYLSULFATE 1 MG/ML IJ SOLN
INTRAMUSCULAR | Status: DC | PRN
  Administered 2013-07-16: 1 mg via INTRAVENOUS
  Administered 2013-07-16: 2 mg via INTRAVENOUS

## 2013-07-16 MED ORDER — IOHEXOL 300 MG/ML  SOLN
INTRAMUSCULAR | Status: DC | PRN
  Administered 2013-07-16: 14 mL

## 2013-07-16 MED ORDER — CISATRACURIUM BESYLATE (PF) 10 MG/5ML IV SOLN
INTRAVENOUS | Status: DC | PRN
  Administered 2013-07-16: 10 mg via INTRAVENOUS

## 2013-07-16 MED ORDER — PHENYLEPHRINE HCL 10 MG/ML IJ SOLN
INTRAMUSCULAR | Status: DC | PRN
  Administered 2013-07-16: 5 ug via INTRAVENOUS

## 2013-07-16 MED ORDER — HYDROMORPHONE HCL PF 1 MG/ML IJ SOLN: 0.2500 mg | INTRAMUSCULAR | Status: DC | PRN

## 2013-07-16 MED ORDER — SUCCINYLCHOLINE CHLORIDE 20 MG/ML IJ SOLN
INTRAMUSCULAR | Status: DC | PRN
  Administered 2013-07-16: 100 mg via INTRAVENOUS

## 2013-07-16 MED ORDER — LACTATED RINGERS IV SOLN
INTRAVENOUS | Status: DC
  Administered 2013-07-16: 12:00:00 via INTRAVENOUS
  Administered 2013-07-16: 1000 mL via INTRAVENOUS

## 2013-07-16 MED ORDER — ONDANSETRON HCL 4 MG/2ML IJ SOLN
INTRAMUSCULAR | Status: DC | PRN
  Administered 2013-07-16: 4 mg via INTRAMUSCULAR

## 2013-07-16 MED ORDER — MIDAZOLAM HCL 5 MG/5ML IJ SOLN
INTRAMUSCULAR | Status: DC | PRN
  Administered 2013-07-16: 2 mg via INTRAVENOUS

## 2013-07-16 MED ORDER — MEPERIDINE HCL 50 MG/ML IJ SOLN
INTRAMUSCULAR | Status: AC
  Filled 2013-07-16: qty 1

## 2013-07-16 MED ORDER — EPHEDRINE SULFATE 50 MG/ML IJ SOLN
INTRAMUSCULAR | Status: DC | PRN
  Administered 2013-07-16: 10 mg via INTRAVENOUS

## 2013-07-16 MED ORDER — DEXAMETHASONE SODIUM PHOSPHATE 10 MG/ML IJ SOLN
INTRAMUSCULAR | Status: DC | PRN
  Administered 2013-07-16: 10 mg via INTRAVENOUS

## 2013-07-16 SURGICAL SUPPLY — 47 items
APL ESCP 34 STRL LF DISP (HEMOSTASIS) ×1
APL SKNCLS STERI-STRIP NONHPOA (GAUZE/BANDAGES/DRESSINGS) ×1
APPLICATOR SURGIFLO ENDO (HEMOSTASIS) ×2 IMPLANT
BAG URINE DRAINAGE (UROLOGICAL SUPPLIES) ×1 IMPLANT
BASKET ZERO TIP NITINOL 2.4FR (BASKET) ×2 IMPLANT
BENZOIN TINCTURE PRP APPL 2/3 (GAUZE/BANDAGES/DRESSINGS) ×5 IMPLANT
BLADE SURG 15 STRL LF DISP TIS (BLADE) ×1 IMPLANT
BLADE SURG 15 STRL SS (BLADE) ×2
BSKT STON RTRVL ZERO TP 2.4FR (BASKET) ×1
CATCHER STONE W/TUBE ADAPTER (UROLOGICAL SUPPLIES) ×1 IMPLANT
CATH FOLEY 2W COUNCIL 20FR 5CC (CATHETERS) IMPLANT
CATH ROBINSON RED A/P 20FR (CATHETERS) IMPLANT
CATH X-FORCE N30 NEPHROSTOMY (TUBING) ×1 IMPLANT
CLOTH BEACON ORANGE TIMEOUT ST (SAFETY) ×2 IMPLANT
COVER SURGICAL LIGHT HANDLE (MISCELLANEOUS) ×2 IMPLANT
DRAPE C-ARM 42X120 X-RAY (DRAPES) ×2 IMPLANT
DRAPE CAMERA CLOSED 9X96 (DRAPES) ×2 IMPLANT
DRAPE LINGEMAN PERC (DRAPES) ×2 IMPLANT
DRAPE SURG IRRIG POUCH 19X23 (DRAPES) ×1 IMPLANT
DRSG TEGADERM 4X4.75 (GAUZE/BANDAGES/DRESSINGS) ×1 IMPLANT
DRSG TEGADERM 8X12 (GAUZE/BANDAGES/DRESSINGS) ×2 IMPLANT
FIBER LASER FLEXIVA 550 (UROLOGICAL SUPPLIES) IMPLANT
GOWN STRL REIN XL XLG (GOWN DISPOSABLE) ×3 IMPLANT
KIT BASIN OR (CUSTOM PROCEDURE TRAY) ×2 IMPLANT
LASER FIBER DISP 1000U (UROLOGICAL SUPPLIES) IMPLANT
MANIFOLD NEPTUNE II (INSTRUMENTS) ×2 IMPLANT
NS IRRIG 1000ML POUR BTL (IV SOLUTION) ×2 IMPLANT
PACK BASIC VI WITH GOWN DISP (CUSTOM PROCEDURE TRAY) ×2 IMPLANT
PAD ABD 7.5X8 STRL (GAUZE/BANDAGES/DRESSINGS) ×2 IMPLANT
PROBE LITHOCLAST ULTRA 3.8X403 (UROLOGICAL SUPPLIES) IMPLANT
PROBE PNEUMATIC 1.0MMX570MM (UROLOGICAL SUPPLIES) ×1 IMPLANT
SET IRRIG Y TYPE TUR BLADDER L (SET/KITS/TRAYS/PACK) ×2 IMPLANT
SET WARMING FLUID IRRIGATION (MISCELLANEOUS) ×1 IMPLANT
SPONGE GAUZE 4X4 12PLY (GAUZE/BANDAGES/DRESSINGS) ×2 IMPLANT
SPONGE LAP 4X18 X RAY DECT (DISPOSABLE) ×2 IMPLANT
STENT CONTOUR 6FRX26X.038 (STENTS) ×1 IMPLANT
STONE CATCHER W/TUBE ADAPTER (UROLOGICAL SUPPLIES) IMPLANT
SURGIFLO W/THROMBIN 8M KIT (HEMOSTASIS) ×1 IMPLANT
SUT SILK 2 0 30  PSL (SUTURE) ×1
SUT SILK 2 0 30 PSL (SUTURE) ×1 IMPLANT
SUT VIC AB 3-0 SH 27 (SUTURE) ×2
SUT VIC AB 3-0 SH 27X BRD (SUTURE) ×1 IMPLANT
SYR 20CC LL (SYRINGE) ×4 IMPLANT
SYRINGE 10CC LL (SYRINGE) ×2 IMPLANT
TOWEL OR NON WOVEN STRL DISP B (DISPOSABLE) ×2 IMPLANT
TRAY FOLEY CATH 14FRSI W/METER (CATHETERS) ×1 IMPLANT
TUBING CONNECTING 10 (TUBING) ×6 IMPLANT

## 2013-07-16 NOTE — Transfer of Care (Signed)
Immediate Anesthesia Transfer of Care Note  Patient: Carmen Buchanan  Procedure(s) Performed: Procedure(s): NEPHROLITHOTOMY PERCUTANEOUS SECOND LOOK, diagnostic ureteroscopy and stent placement on the right  (Right)  Patient Location: PACU  Anesthesia Type:General  Level of Consciousness: awake, oriented, patient cooperative, lethargic and responds to stimulation  Airway & Oxygen Therapy: Patient Spontanous Breathing and Patient connected to face mask oxygen  Post-op Assessment: Report given to PACU RN, Post -op Vital signs reviewed and stable and Patient moving all extremities  Post vital signs: Reviewed and stable  Complications: No apparent anesthesia complications

## 2013-07-16 NOTE — Progress Notes (Signed)
2 Days Post-Op  Subjective:  1 - Rt Partial Staghorn Stone - s/p 1st stage percutaneous nephrostolithtomy 07/14/2013. Plan for second stage today 10/22.   Today Carmen Buchanan is doing well. No fevers overnight or tube problems. She is NPO for surgery later today.    Objective: Vital signs in last 24 hours: Temp:  [97.5 F (36.4 C)-98.3 F (36.8 C)] 97.5 F (36.4 C) (10/22 0443) Pulse Rate:  [56-64] 63 (10/22 0443) Resp:  [18-20] 18 (10/22 0443) BP: (110-123)/(64-71) 123/71 mmHg (10/22 0443) SpO2:  [98 %-99 %] 98 % (10/22 0443) Last BM Date: 07/14/13  Intake/Output from previous day: 10/21 0701 - 10/22 0700 In: 2292.5 [P.O.:960; I.V.:1232.5; IV Piggyback:100] Out: 3100 [Urine:3100] Intake/Output this shift: Total I/O In: 525 [I.V.:525] Out: 1100 [Urine:1100]  General appearance: alert, cooperative and appears stated age Head: Normocephalic, without obvious abnormality, atraumatic Eyes: conjunctivae/corneas clear. PERRL, EOM's intact. Fundi benign. Ears: normal TM's and external ear canals both ears Nose: Nares normal. Septum midline. Mucosa normal. No drainage or sinus tenderness. Throat: lips, mucosa, and tongue normal; teeth and gums normal Neck: no adenopathy, no carotid bruit, no JVD, supple, symmetrical, trachea midline and thyroid not enlarged, symmetric, no tenderness/mass/nodules Back: symmetric, no curvature. ROM normal. No CVA tenderness. Resp: clear to auscultation bilaterally Chest wall: no tenderness Cardio: regular rate and rhythm, S1, S2 normal, no murmur, click, rub or gallop GI: soft, non-tender; bowel sounds normal; no masses,  no organomegaly Pelvic: external genitalia normal and foley c/d/i with pink urine Extremities: extremities normal, atraumatic, no cyanosis or edema Pulses: 2+ and symmetric Skin: Skin color, texture, turgor normal. No rashes or lesions Lymph nodes: Cervical, supraclavicular, and axillary nodes normal. Neurologic: Grossly  normal Incision/Wound: Rt neph tube c/d/i with pink urine (clearing). Nephroureteral stent capped.  Lab Results:   Recent Labs  07/14/13 1509 07/15/13 0416  HGB 11.3* 10.9*  HCT 34.4* 33.1*   BMET  Recent Labs  07/14/13 1509 07/15/13 0416  NA 137 134*  K 3.8 4.2  CL 105 101  CO2 24 25  GLUCOSE 113* 137*  BUN 15 13  CREATININE 0.69 0.78  CALCIUM 8.6 9.3   PT/INR No results found for this basename: LABPROT, INR,  in the last 72 hours ABG No results found for this basename: PHART, PCO2, PO2, HCO3,  in the last 72 hours  Studies/Results: Dg Abd 1 View  07/14/2013   CLINICAL DATA:  History of right percutaneous nephrostomy and stent placement for removal of staghorn calculus.  EXAM: ABDOMEN - 1 VIEW  COMPARISON:  CT 06/10/2013 at [facility]  FINDINGS: Intraprocedural images demonstrate opacification of the right renal collecting system with percutaneous approach and ureteral stent placement and nephrostomy tube placement. Subjective mild narrowing at the right UPJ is noted.  IMPRESSION: Intraprocedural imaging as above.   Electronically Signed   By: Christiana Pellant M.D.   On: 07/14/2013 19:32   Dg C-arm 61-120 Min-no Report  07/14/2013   CLINICAL DATA: kidney stone   C-ARM 61-120 MINUTES  Fluoroscopy was utilized by the requesting physician.  No radiographic  interpretation.     Anti-infectives: Anti-infectives   Start     Dose/Rate Route Frequency Ordered Stop   07/16/13 1000  gentamicin (GARAMYCIN) 390 mg in dextrose 5 % 100 mL IVPB     5 mg/kg  78.2 kg (Adjusted) 109.8 mL/hr over 60 Minutes Intravenous  Once 07/15/13 0756     07/14/13 1242  gentamicin (GARAMYCIN) 470 mg in dextrose 5 % 100 mL  IVPB     470 mg 111.8 mL/hr over 60 Minutes Intravenous 60 min pre-op 07/14/13 1242 07/14/13 1217      Assessment/Plan:  1 - Rt Partial Staghorn Stone - Proceed today as scheduled for second stage right percutaneous nephrostolithotomy. Goal will be to  remove neph tube and leave only internalized Rt JJ stent as long as proceeds smoothly. Risks and benefits once again outlined as per previous. Likely DC home tomorrow.   [facility], Traye Bates 07/16/2013

## 2013-07-16 NOTE — Anesthesia Preprocedure Evaluation (Addendum)
Anesthesia Evaluation  Patient identified by MRN, date of birth, ID band Patient awake  General Assessment Comment: Obesity     .  IBS (irritable bowel syndrome)         intermittent   .  Migraine, unspecified, without mention of intractable migraine without mention of status migrainosus     .  Anxiety         much improved after weight loss started in late 2012   .  Hemorrhoid     .  Complication of anesthesia         after hemorrhoid surgery-inability to sleep for 3 weeks   .  GERD (gastroesophageal reflux disease)     .  Arthritis     .  HTN (hypertension)         stopped meds after bariatric surgery     Reviewed: Allergy & Precautions, H&P , NPO status , Patient's Chart, lab work & pertinent test results  History of Anesthesia Complications (+) history of anesthetic complications  Airway Mallampati: II TM Distance: >3 FB Neck ROM: Full    Dental no notable dental hx.    Pulmonary neg pulmonary ROS,  breath sounds clear to auscultation  Pulmonary exam normal       Cardiovascular hypertension, negative cardio ROS  Rhythm:Regular Rate:Normal     Neuro/Psych  Headaches, Anxiety negative psych ROS   GI/Hepatic Neg liver ROS, GERD-  Medicated,  Endo/Other  negative endocrine ROS  Renal/GU Renal disease  negative genitourinary   Musculoskeletal negative musculoskeletal ROS (+)   Abdominal (+) + obese,   Peds negative pediatric ROS (+)  Hematology negative hematology ROS (+)   Anesthesia Other Findings   Reproductive/Obstetrics negative OB ROS                          Anesthesia Physical Anesthesia Plan  ASA: II  Anesthesia Plan: General   Post-op Pain Management:    Induction: Intravenous  Airway Management Planned: Oral ETT  Additional Equipment:   Intra-op Plan:   Post-operative Plan: Extubation in OR  Informed Consent: I have reviewed the patients History and  Physical, chart, labs and discussed the procedure including the risks, benefits and alternatives for the proposed anesthesia with the patient or authorized representative who has indicated his/her understanding and acceptance.   Dental advisory given  Plan Discussed with: CRNA  Anesthesia Plan Comments: (Easy intubation 07-14-13)       Anesthesia Quick Evaluation

## 2013-07-16 NOTE — Anesthesia Procedure Notes (Addendum)
Performed by: Edison Pace    Performed by: Edison Pace Preoxygenation: report regarding intubation by [place] CRNA , easy intubation mac 4.    Procedure Name: Intubation Date/Time: 07/16/2013 10:47 AM Performed by: Edison Pace Pre-anesthesia Checklist: Patient identified, Emergency Drugs available, Suction available, Patient being monitored and Timeout performed Patient Re-evaluated:Patient Re-evaluated prior to inductionOxygen Delivery Method: Circle system utilized Preoxygenation: Pre-oxygenation with 100% oxygen Intubation Type: IV induction Ventilation: Mask ventilation without difficulty Laryngoscope Size: Mac and 4 Grade View: Grade I Tube type: Oral Tube size: 7.5 mm Number of attempts: 1 Airway Equipment and Method: Stylet Placement Confirmation: ETT inserted through vocal cords under direct vision and positive ETCO2 Tube secured with: Tape Dental Injury: Teeth and Oropharynx as per pre-operative assessment

## 2013-07-16 NOTE — Brief Op Note (Signed)
07/14/2013 - 07/16/2013  12:06 PM  PATIENT:  Carmen Buchanan  57 y.o. female  PRE-OPERATIVE DIAGNOSIS:  RIGHT PARTIAL STAGHORN KIDNEY STONE  POST-OPERATIVE DIAGNOSIS:  right partial staghorn kidney stone  PROCEDURE:  Procedure(s): NEPHROLITHOTOMY PERCUTANEOUS SECOND LOOK, diagnostic ureteroscopy and stent placement on the right  (Right)  SURGEON:  Surgeon(s) and Role:    * Sebastian Ache, MD - Primary  PHYSICIAN ASSISTANT:   ASSISTANTS: none   ANESTHESIA:   general  EBL:  Total I/O In: 0  Out: 200 [Urine:200]  BLOOD ADMINISTERED:none  DRAINS: Foley to straight drain   LOCAL MEDICATIONS USED:  NONE  SPECIMEN:  Source of Specimen:  Residual Rt Renal Stone  DISPOSITION OF SPECIMEN:  discard  COUNTS:  YES  TOURNIQUET:  * No tourniquets in log *  DICTATION: .Other Dictation: Dictation Number [id number]  PLAN OF CARE: Admit to inpatient   PATIENT DISPOSITION:  PACU - hemodynamically stable.   Delay start of Pharmacological VTE agent (>24hrs) due to surgical blood loss or risk of bleeding: yes

## 2013-07-16 NOTE — H&P (Signed)
Carmen Buchanan is an 57 y.o. female.    Chief Complaint: Pre-Op Left Ureteral Stent Placement  HPI:      1 - Left Malignant Hydronephrosis - Pt with newly diagnosed metastatic colon cancer wtih 6cm pelvic mass encasing left ureter and proximal vagina with mild proximal hydronephrosis (per report, images not avail for review). Pt planning to undergo chemotherapy under the care of Dr. Myna Hidalgo pending evaluaiton of renal function and left renal decompression. Pelvic 06/2013 w/o fixed masses.   2 - Urinary Frequency / Urgency / Incomplete Emtyping- Pt with increasing irritative voiding symptoms x 6 mos. No incontinence. Overall modest bother. Pelvic 06/2013 w/o fixed masses. PVR 06/2013 " ". 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  PMH sig for metatatic colon cancer (surgery x2, chemo x2, plan for more chemo and then surgery by Flonnie Hailstone at [facility]), liver resection, chole, PE (not anticoagulated), benign hyst, chemo neuropathy.  Today Carmen Buchanan is seen to proceed with cysto and left ureteral stent for renal decompression prior to chemo.   Past Medical History  Diagnosis Date  . Obesity   . IBS (irritable bowel syndrome)     intermittent  . Migraine, unspecified, without mention of intractable migraine without mention of status migrainosus   . Anxiety     much improved after weight loss started in late 2012  . Hemorrhoid   . Complication of anesthesia     after hemorrhoid surgery-inability to sleep for 3 weeks  . GERD (gastroesophageal reflux disease)   . Arthritis   . HTN (hypertension)     stopped meds after bariatric surgery    Past Surgical History  Procedure Laterality Date  . Tonsillectomy and adenoidectomy  1970  . Laser ablation condyloma cervical / vulvar  5/04    uncontrollable bleeding  . Cesarean section    . Lumbar disc surgery  7/01    L4-5; Dr. Elesa Hacker  . Gastric bypass      06/2011 in [facility]  . Nephrolithotomy Right 07/14/2013    Procedure: NEPHROLITHOTOMY  PERCUTANEOUS  Procedure: Right 1st stage Percutaneous Nephrostolithotomy with surgeon access and cystoscopy;  Surgeon: Sebastian Ache, MD;  Location: [facility];  Service: Urology;  Laterality: Right;  . Cystoscopy N/A 07/14/2013    Procedure: CYSTOSCOPY;  Surgeon: Sebastian Ache, MD;  Location: [facility];  Service: Urology;  Laterality: N/A;    Family History  Problem Relation Age of Onset  . Coronary artery disease Father     MI; CABG  . Aortic aneurysm Mother     on betablocker  . Other Mother     Cardiovascualr disease  . Ulcers Mother   . Colon polyps      Family history   Social History:  reports that she has never smoked. She does not have any smokeless tobacco history on file. She reports that she drinks alcohol. She reports that she does not use illicit drugs.  Allergies:  Allergies  Allergen Reactions  . Ciprofloxacin     REACTION: nausea  . Morphine     REACTION: HA and NAV, but tolerates hydrocodone  . Nsaids     Due to gastric bypass    Medications Prior to Admission  Medication Sig Dispense Refill  . acetaminophen (TYLENOL) 500 MG tablet Take 1,000 mg by mouth every 6 (six) hours as needed for pain.       Marland Kitchen LORazepam (ATIVAN) 0.5 MG tablet Take 0.5 mg by mouth every 8 (eight) hours as needed for anxiety.      Marland Kitchen  Multiple Vitamin (MULTIVITAMIN WITH MINERALS) TABS tablet Take 1 tablet by mouth daily.      Marland Kitchen omeprazole (PRILOSEC) 20 MG capsule Take 20 mg by mouth 2 (two) times daily as needed (heart burn).       . polyethylene glycol (MIRALAX / GLYCOLAX) packet Take 17 g by mouth daily.      . betamethasone dipropionate (DIPROLENE) 0.05 % cream Apply 1 application topically 2 (two) times daily as needed (rash).      Marland Kitchen HYDROcodone-acetaminophen (NORCO/VICODIN) 5-325 MG per tablet Take 1 tablet by mouth every 6 (six) hours as needed for pain.        Results for orders placed during the hospital encounter of 07/14/13 (from the past 48 hour(s))  BASIC METABOLIC PANEL      Status: Abnormal   Collection Time    07/14/13  3:09 PM      Result Value Range   Sodium 137  135 - 145 mEq/L   Potassium 3.8  3.5 - 5.1 mEq/L   Chloride 105  96 - 112 mEq/L   CO2 24  19 - 32 mEq/L   Glucose, Bld 113 (*) 70 - 99 mg/dL   BUN 15  6 - 23 mg/dL   Creatinine, Ser 4.09  0.50 - 1.10 mg/dL   Calcium 8.6  8.4 - 81.1 mg/dL   GFR calc non Af Amer >90  >90 mL/min   GFR calc Af Amer >90  >90 mL/min   Comment: (NOTE)     The eGFR has been calculated using the CKD EPI equation.     This calculation has not been validated in all clinical situations.     eGFR's persistently <90 mL/min signify possible Chronic Kidney     Disease.  HEMOGLOBIN AND HEMATOCRIT, BLOOD     Status: Abnormal   Collection Time    07/14/13  3:09 PM      Result Value Range   Hemoglobin 11.3 (*) 12.0 - 15.0 g/dL   HCT 91.4 (*) 78.2 - 95.6 %  BASIC METABOLIC PANEL     Status: Abnormal   Collection Time    07/15/13  4:16 AM      Result Value Range   Sodium 134 (*) 135 - 145 mEq/L   Potassium 4.2  3.5 - 5.1 mEq/L   Chloride 101  96 - 112 mEq/L   CO2 25  19 - 32 mEq/L   Glucose, Bld 137 (*) 70 - 99 mg/dL   BUN 13  6 - 23 mg/dL   Creatinine, Ser 2.13  0.50 - 1.10 mg/dL   Calcium 9.3  8.4 - 08.6 mg/dL   GFR calc non Af Amer >90  >90 mL/min   GFR calc Af Amer >90  >90 mL/min   Comment: (NOTE)     The eGFR has been calculated using the CKD EPI equation.     This calculation has not been validated in all clinical situations.     eGFR's persistently <90 mL/min signify possible Chronic Kidney     Disease.  HEMOGLOBIN AND HEMATOCRIT, BLOOD     Status: Abnormal   Collection Time    07/15/13  4:16 AM      Result Value Range   Hemoglobin 10.9 (*) 12.0 - 15.0 g/dL   HCT 57.8 (*) 46.9 - 62.9 %   Dg Abd 1 View  07/14/2013   CLINICAL DATA:  History of right percutaneous nephrostomy and stent placement for removal of staghorn calculus.  EXAM: ABDOMEN - 1  VIEW  COMPARISON:  CT 06/10/2013 at [facility]  FINDINGS: Intraprocedural images demonstrate opacification of the right renal collecting system with percutaneous approach and ureteral stent placement and nephrostomy tube placement. Subjective mild narrowing at the right UPJ is noted.  IMPRESSION: Intraprocedural imaging as above.   Electronically Signed   By: Christiana Pellant M.D.   On: 07/14/2013 19:32   Dg C-arm 61-120 Min-no Report  07/14/2013   CLINICAL DATA: kidney stone   C-ARM 61-120 MINUTES  Fluoroscopy was utilized by the requesting physician.  No radiographic  interpretation.     Review of Systems  Constitutional: Negative.  Negative for fever and chills.  HENT: Negative.   Eyes: Negative.   Respiratory: Negative.   Cardiovascular: Negative.   Gastrointestinal: Negative.  Negative for nausea and vomiting.  Genitourinary: Negative.  Negative for flank pain.  Musculoskeletal: Negative.   Skin: Negative.   Neurological: Negative.   Endo/Heme/Allergies: Negative.   Psychiatric/Behavioral: Negative.     Blood pressure 123/71, pulse 63, temperature 97.5 F (36.4 C), temperature source Oral, resp. rate 18, height 5\' 9"  (1.753 m), weight 96.163 kg (212 lb), SpO2 98.00%. Physical Exam  Constitutional: She is oriented to person, place, and time. She appears well-developed and well-nourished.  Some cachexia  HENT:  Head: Normocephalic and atraumatic.  Eyes: EOM are normal. Pupils are equal, round, and reactive to light.  Neck: Normal range of motion. Neck supple.  Cardiovascular: Normal rate.   Respiratory: Effort normal.  GI: Soft. Bowel sounds are normal.  Genitourinary:  No CVAT  Musculoskeletal: Normal range of motion.  Neurological: She is alert and oriented to person, place, and time.  Skin: Skin is warm and dry.  Psychiatric: She has a normal mood and affect. Her behavior is normal. Judgment and thought content normal.     Assessment/Plan   1 - Left Malignant Hydronephrosis - Agree wtih need for renal  decompression prior to chemo to maximize kidney funciton.  Discussed neph tube (more definitive but more morbid) v. ureteral stenting as options as well as need for periodic changes at least Q3-6 mos to prevent encrustation. Pt voiced understanding and wants trial of left ureteral stent. This is reasonable.   We r discussed cysto / reterograde / stenting in detail.  We rediscussed risks including bleeding, infection, damage to kidney / ureter  bladder, rarely loss of kidney. We rediscussed anesthetic risks and rare but serious surgical complications including DVT, PE, MI, and mortality. We specifically readdressed that in some cases progressive obstruction is possible leading to stent failure and needing to convert to nephrostomy. The patient voiced understanding and wishes to proceed.   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 Sympotms minimal and she elects observaiton.   Nikolaj Geraghty 07/16/2013, 6:39 AM

## 2013-07-16 NOTE — Anesthesia Postprocedure Evaluation (Signed)
  Anesthesia Post-op Note  Patient: Carmen Buchanan  Procedure(s) Performed: Procedure(s) (LRB): NEPHROLITHOTOMY PERCUTANEOUS SECOND LOOK, diagnostic ureteroscopy and stent placement on the right  (Right)  Patient Location: PACU  Anesthesia Type: General  Level of Consciousness: awake and alert   Airway and Oxygen Therapy: Patient Spontanous Breathing  Post-op Pain: mild  Post-op Assessment: Post-op Vital signs reviewed, Patient's Cardiovascular Status Stable, Respiratory Function Stable, Patent Airway and No signs of Nausea or vomiting  Last Vitals:  Filed Vitals:   07/16/13 1533  BP: 130/65  Pulse: 72  Temp: 36.8 C  Resp: 20    Post-op Vital Signs: stable   Complications: No apparent anesthesia complications

## 2013-07-17 ENCOUNTER — Encounter (HOSPITAL_COMMUNITY): Payer: Self-pay | Admitting: Urology

## 2013-07-17 MED ORDER — SENNOSIDES-DOCUSATE SODIUM 8.6-50 MG PO TABS: 1.0000 | ORAL_TABLET | Freq: Two times a day (BID) | ORAL | Status: DC

## 2013-07-17 MED ORDER — SULFAMETHOXAZOLE-TMP DS 800-160 MG PO TABS: 1.0000 | ORAL_TABLET | Freq: Two times a day (BID) | ORAL | Status: DC

## 2013-07-17 MED ORDER — OXYCODONE-ACETAMINOPHEN 5-325 MG PO TABS: 1.0000 | ORAL_TABLET | ORAL | Status: DC | PRN

## 2013-07-17 NOTE — Progress Notes (Signed)
Patient discharged. Room cleaned and found blue bathrobe at the bedside. Message left at home residence and that the bathrobe will be at the secretary desk on 4W and left the number to call.

## 2013-07-17 NOTE — Discharge Summary (Signed)
Physician Discharge Summary  Patient ID: Carmen Buchanan MRN: [id number] DOB/AGE: 56-Aug-1957 56 y.o.  Admit date: 07/14/2013 Discharge date: 07/17/2013  Admission Diagnoses: Rt Partial Staghorn Kidney Stone  Discharge Diagnoses:  Rt Partial Staghorn Kidney Stone Active Problems:   * No active hospital problems. *   Discharged Condition: good  Hospital Course:    1 - Rt Partial Staghorn Kidney Stone - Underwent 1st stage percutaneous nephrostolithotomy on 10/20 and second stage procedure on 10/22 without acute complications. She had nephrostomy tube in place between surgeries that was transitioned to rt JJ stent only by discharge. By 10/23, the day of discharge, she was ambulatory, voiding, pain controlled, and felt to be adequate for discharge.   Consults: None  Significant Diagnostic Studies: labs: Hgb >10  Treatments: surgery:  1st stage percutaneous nephrostolithotomy on 10/20 and second stage procedure on 10/22   Discharge Exam: Blood pressure 114/71, pulse 66, temperature 98.1 F (36.7 C), temperature source Oral, resp. rate 18, height 5\' 9"  (1.753 m), weight 96.163 kg (212 lb), SpO2 100.00%. General appearance: alert, cooperative, appears stated age and Husband at bedside Head: Normocephalic, without obvious abnormality, atraumatic Eyes: conjunctivae/corneas clear. PERRL, EOM's intact. Fundi benign. Ears: normal TM's and external ear canals both ears Nose: Nares normal. Septum midline. Mucosa normal. No drainage or sinus tenderness. Throat: lips, mucosa, and tongue normal; teeth and gums normal Neck: no adenopathy, no carotid bruit, no JVD, supple, symmetrical, trachea midline and thyroid not enlarged, symmetric, no tenderness/mass/nodules Back: symmetric, no curvature. ROM normal. No CVA tenderness. Resp: clear to auscultation bilaterally Chest wall: no tenderness Cardio: regular rate and rhythm, S1, S2 normal, no murmur, click, rub or gallop GI: soft, non-tender; bowel  sounds normal; no masses,  no organomegaly Pelvic: external genitalia normal Extremities: extremities normal, atraumatic, no cyanosis or edema Pulses: 2+ and symmetric Skin: Skin color, texture, turgor normal. No rashes or lesions Lymph nodes: Cervical, supraclavicular, and axillary nodes normal. Neurologic: Alert and oriented X 3, normal strength and tone. Normal symmetric reflexes. Normal coordination and gait Incision/Wound: Recent Rt PCNL site on right flank c/d/i with dry dressing. No erytheyma / hematoma / fluctuence.  Disposition: 01-Home or Self Care     Medication List         acetaminophen 500 MG tablet  Commonly known as:  TYLENOL  Take 1,000 mg by mouth every 6 (six) hours as needed for pain.     betamethasone dipropionate 0.05 % cream  Commonly known as:  DIPROLENE  Apply 1 application topically 2 (two) times daily as needed (rash).     HYDROcodone-acetaminophen 5-325 MG per tablet  Commonly known as:  NORCO/VICODIN  Take 1 tablet by mouth every 6 (six) hours as needed for pain.     LORazepam 0.5 MG tablet  Commonly known as:  ATIVAN  Take 0.5 mg by mouth every 8 (eight) hours as needed for anxiety.     multivitamin with minerals Tabs tablet  Take 1 tablet by mouth daily.     omeprazole 20 MG capsule  Commonly known as:  PRILOSEC  Take 20 mg by mouth 2 (two) times daily as needed (heart burn).     oxyCODONE-acetaminophen 5-325 MG per tablet  Commonly known as:  ROXICET  Take 1 tablet by mouth every 4 (four) hours as needed for pain. Post-operatively     polyethylene glycol packet  Commonly known as:  MIRALAX / GLYCOLAX  Take 17 g by mouth daily.     senna-docusate 8.6-50 MG per  tablet  Commonly known as:  Senokot-S  Take 1 tablet by mouth 2 (two) times daily. While taking pain meds to prevent constipation.     sulfamethoxazole-trimethoprim 800-160 MG per tablet  Commonly known as:  BACTRIM DS  Take 1 tablet by mouth 2 (two) times daily. X 3 days.  Begin day prior to next urology appointment.           Follow-up Information   Follow up with Sebastian Ache, MD On 07/29/2013. (at 3:45 PM for MD visit and stent removal)    Specialty:  Urology   Contact information:   [address] [place] [address] [phone]       Signed: Sebastian Ache 07/17/2013, 9:27 AM

## 2013-07-17 NOTE — Op Note (Signed)
Carmen Buchanan, Carmen Buchanan                ACCOUNT NO.:  [id number]  MEDICAL RECORD NO.:  [id number]  LOCATION:  1423                         FACILITY:  [facility]  PHYSICIAN:  Sebastian Ache, MD     DATE OF BIRTH:  27-Oct-1955  DATE OF PROCEDURE: 07/16/2013 DATE OF DISCHARGE:                              OPERATIVE REPORT   DIAGNOSIS:  Residual right renal stone.  PROCEDURES: 1. Second stage right percutaneous nephrostolithotomy stone, less than     2 cm. 2. Diagnostic right ureteroscopy. 3. Right antegrade nephrostogram with interpretation. 4. Exchange of right ureteral stent, 6 x 26, no tether.  COMPLICATIONS:  None.  SPECIMEN:  Small volume right intrarenal residual stones for discard.  FINDINGS: 1. Small volume multifocal residual right renal stone mostly in acute     mid-pole calyx, total volume approximately 4 mm. 2. No residual stone fragments in the right ureter.  ESTIMATED BLOOD LOSS:  Nil.  INDICATION:  Carmen Buchanan is a pleasant 57 year old lady with history of right partial staghorn kidney stone.  She underwent first-stage percutaneous nephrostolithotomy on July 14, 2013.  At which point, basketing of right ureteral stone was removed given the multifocality of her stones as well as stone and acutely angled calices to the access tract.  It was felt that the second-stage procedure was warranted. Informed consent was obtained and placed in the medical record.  PROCEDURE IN DETAIL:  The patient being Carmen Buchanan, was verified. Procedure being right second-stage percutaneous nephrostolithotomy was confirmed.  Procedure was carried out.  Time-out was performed. Intravenous antibiotics were administered.  General endotracheal anesthesia was introduced.  The patient was placed into a prone position employing prone view, sequential compression devices, padding of the knees, and elbows.  Sterile field was created by prepping and draping the patient's entire right flank including  the previous nephroureteral stent and right nephrostomy tube.  Next, right antegrade nephrostogram was obtained.  Right antegrade nephrostogram revealed a single-system right kidney and right ureter.  No obvious filling defects or narrowing noted.  No excessive extravasation of contrast was noted.  The nephrostomy tube was then removed.  Next, flexible nephroscopy was performed using a 16- French flexible cystoscope, which allowed panendoscopy of all calices of the right kidney.  There were multifocal very small fragments noted, total stone volume approximately 4 mm, most concentrated and acutely angled mid-pole calyx.  These were amenable to simple basketing and were removed with a ZeroTip basket and set aside for discard, and to verify no fragments in the ureter.  A superstiff wire was placed on the right ureter under continuous fluoroscopic and arthroscopic vision.  Flexible nephroscope was exchanged for the 6-French flexible ureteroscope, which was placed using fluoroscopic guidance at the level of the bladder. Next, complete endoscopy of the right ureter was performed, no calcifications or mucosal abnormalities were found.  A 0.038 zip wire was advanced to the level of the urinary bladder.  Using combination of fluoroscopic and arthroscopic guidance, a new 6 x 26 double-J stent was placed in an antegrade fashion.  Good distal curl in the urinary bladder and proximal curl in the renal pelvis were noted.  The remaining nephroureteral stent acting as  a safety wire was removed and the ureteral stent remained in good position and the tract was closed with FloSeal in interrupted pedicle at the level of skin x3.  The procedure was then terminated.  The patient tolerated the procedure well.  There were no immediate periprocedural complications.  The patient was taken to the postanesthesia care unit in stable condition.          ______________________________ Sebastian Ache,  MD     TM/MEDQ  D:  07/16/2013  T:  07/17/2013  Job:  [id number]

## 2013-09-05 ENCOUNTER — Other Ambulatory Visit: Payer: Self-pay | Admitting: Obstetrics and Gynecology

## 2013-10-22 ENCOUNTER — Encounter: Payer: Self-pay | Admitting: Family Medicine

## 2013-10-22 ENCOUNTER — Ambulatory Visit (INDEPENDENT_AMBULATORY_CARE_PROVIDER_SITE_OTHER): Payer: PRIVATE HEALTH INSURANCE | Admitting: Family Medicine

## 2013-10-22 VITALS — BP 130/80 | HR 84 | Temp 98.3°F | Wt 223.0 lb

## 2013-10-22 DIAGNOSIS — J329 Chronic sinusitis, unspecified: Secondary | ICD-10-CM

## 2013-10-22 MED ORDER — FLUTICASONE PROPIONATE 50 MCG/ACT NA SUSP: 2.0000 | Freq: Every day | NASAL | Status: DC

## 2013-10-22 NOTE — Progress Notes (Signed)
Pre-visit discussion using our clinic review tool. No additional management support is needed unless otherwise documented below in the visit note.  Sx started about 2 weeks ago.  Upper tooth and facial pain.  Congested.  Started on augmentin.  Still on it now, 1 week on abx now.  Compliant with meds.  Ear ache and HA are still as bad as prev.  No fevers.  No vomiting.  No ST, mildly scratchy. Occ cough, likely from post nasal gtt.  Never on nasal steroids.    Meds, vitals, and allergies reviewed.   ROS: See HPI.  Otherwise, noncontributory.  GEN: nad, alert and oriented HEENT: mucous membranes moist, tm w/o erythema, nasal exam w/o erythema, clear discharge noted,  OP with cobblestoning, sinuses ttp x4 NECK: supple w/o LA CV: rrr.   PULM: ctab, no inc wob EXT: no edema SKIN: no acute rash

## 2013-10-22 NOTE — Assessment & Plan Note (Signed)
Add on flonase, finish augmentin.  If not improved, we'll have to consider change in abx.  Would give this more time for now. She agrees.  Nontoxic.

## 2013-10-22 NOTE — Patient Instructions (Signed)
Try the flonase, finish the augmentin and update me in a few days.

## 2013-10-24 ENCOUNTER — Telehealth: Payer: Self-pay | Admitting: Family Medicine

## 2013-10-24 MED ORDER — AZITHROMYCIN 250 MG PO TABS: ORAL_TABLET | ORAL | Status: DC

## 2013-10-24 NOTE — Telephone Encounter (Signed)
Patient advised.

## 2013-10-24 NOTE — Telephone Encounter (Signed)
Pt saw Dr. Damita Dunnings on 10/22/13.  She was told to call if she was not feeling any better and said that Dr. Damita Dunnings would either extend her abx or send in a different abx RX.  She said she has seen no improvement and if anything her symptoms are a bit worse.  Please advise.

## 2013-10-24 NOTE — Telephone Encounter (Signed)
I would change to zithromax.  rx sent. Thanks.

## 2013-11-05 ENCOUNTER — Encounter: Payer: Self-pay | Admitting: Family Medicine

## 2013-11-05 ENCOUNTER — Ambulatory Visit (INDEPENDENT_AMBULATORY_CARE_PROVIDER_SITE_OTHER): Payer: PRIVATE HEALTH INSURANCE | Admitting: Family Medicine

## 2013-11-05 VITALS — BP 128/80 | HR 78 | Temp 99.2°F | Wt 221.2 lb

## 2013-11-05 DIAGNOSIS — J329 Chronic sinusitis, unspecified: Secondary | ICD-10-CM

## 2013-11-05 DIAGNOSIS — J029 Acute pharyngitis, unspecified: Secondary | ICD-10-CM

## 2013-11-05 LAB — POCT RAPID STREP A (OFFICE): Rapid Strep A Screen: NEGATIVE

## 2013-11-05 MED ORDER — CLINDAMYCIN HCL 300 MG PO CAPS: 300.0000 mg | ORAL_CAPSULE | Freq: Four times a day (QID) | ORAL | Status: DC

## 2013-11-05 MED ORDER — LORAZEPAM 0.5 MG PO TABS: 0.5000 mg | ORAL_TABLET | Freq: Three times a day (TID) | ORAL | Status: DC | PRN

## 2013-11-05 NOTE — Progress Notes (Signed)
Pre-visit discussion using our clinic review tool. No additional management support is needed unless otherwise documented below in the visit note.  She was prev on abx, some better but not resolved.  Off abx for a period of time and then sx got much worse in the last few days.  Voice is hoarse. No vomiting.  ST.  tmax is 99.2 here, since she hasn't been checking her temp. Some cough, episodic, intermittent.  B ear pain.  B facial pain.  Sx in spite of otc tx, prev abx and steroid nasal spray.    She needed a refill on her BZD. No ADE on med.   Meds, vitals, and allergies reviewed.   ROS: See HPI.  Otherwise, noncontributory.  GEN: nad, alert and oriented HEENT: mucous membranes moist, tm w/o erythema, nasal exam w/o erythema, clear discharge noted,  OP with cobblestoning, Sinuses ttp NECK: supple w/o LA CV: rrr.   PULM: ctab, no inc wob EXT: no edema SKIN: no acute rash

## 2013-11-05 NOTE — Assessment & Plan Note (Signed)
Presumed bacterial source. Start clinda, d/w pt re: routine cautions and f/u prn.  We may need to refer to ENT if sx continue.  She agrees.  Okay for outpatient f/u.

## 2013-11-05 NOTE — Patient Instructions (Signed)
Start taking clindamycin.  If you have significant diarrhea, then notify us.   We may need to get you to see ENT in the future.  Take care.

## 2013-11-10 ENCOUNTER — Telehealth: Payer: Self-pay | Admitting: Family Medicine

## 2013-11-10 NOTE — Telephone Encounter (Signed)
Noted.  I'll defer to patient.  

## 2013-11-10 NOTE — Telephone Encounter (Signed)
Yes, agree with UC eval.  Thanks.

## 2013-11-10 NOTE — Telephone Encounter (Addendum)
Pt left v/m on her way back home; pt went to [facility] and there was a 2 hour wait and pt checked with another UC and there was a 3 hour wait and pt said she does not feel like waiting 2-3 hours to be seen. Pt had near fainting spell this morning and pt wants to know if Dr Damita Dunnings would change antibiotic to med for cough ? bronchitis or whatever can be done for pt over the phone. Pt request cb with answer. [facility]

## 2013-11-10 NOTE — Telephone Encounter (Signed)
Patient says she did not get that impression from the nurse she spoke with earlier.  She says she appreciates our getting back with her and she understands our advice but she thinks she almost fainted because she is a bariatric patient and it had been too long since she ate or drank anything.  She very nicely says she is going to take her chances at this being the case because she does not feel up to waiting 2-3 hours in a room full of germs in order to be seen.
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
## 2013-11-10 NOTE — Telephone Encounter (Signed)
The issue isn't changing the abx/med.  It's that she almost fainted and needs eval.  I would have her get seen today.  That's the safest option.

## 2014-03-03 ENCOUNTER — Other Ambulatory Visit: Payer: Self-pay | Admitting: Family Medicine

## 2014-03-03 NOTE — Telephone Encounter (Signed)
Please call in.  Thanks.   

## 2014-03-03 NOTE — Telephone Encounter (Signed)
Medication phoned to pharmacy.  

## 2014-03-03 NOTE — Telephone Encounter (Signed)
Electronic refill request. Last Filled:   30 tablet 1 RF on   11/05/2013.  Please advise.

## 2014-06-09 ENCOUNTER — Other Ambulatory Visit: Payer: Self-pay | Admitting: Family Medicine

## 2014-06-09 NOTE — Telephone Encounter (Signed)
Last filled 03/03/2014

## 2014-06-10 NOTE — Telephone Encounter (Signed)
Please call in.  Thanks.   

## 2014-06-10 NOTE — Telephone Encounter (Signed)
Called to [facility]. 

## 2014-08-17 ENCOUNTER — Other Ambulatory Visit: Payer: Self-pay | Admitting: Family Medicine

## 2014-08-18 NOTE — Telephone Encounter (Signed)
plz phone in. 

## 2014-08-18 NOTE — Telephone Encounter (Signed)
Electronic refill request.  No CPE in > 1 year.  Last Filled:    30 tablet 1 RF  on 06/10/2014  Please advise.

## 2014-08-19 NOTE — Telephone Encounter (Signed)
Rx called in as directed.   

## 2014-09-19 IMAGING — CR DG CHEST 2V
2 series · 2 of 2 positions shown · non-contrast
Comparison: Chest radiograph 07/24/2005.

CLINICAL DATA: Preoperative evaluation.

EXAM:
CHEST  2 VIEW

[w chest pa]
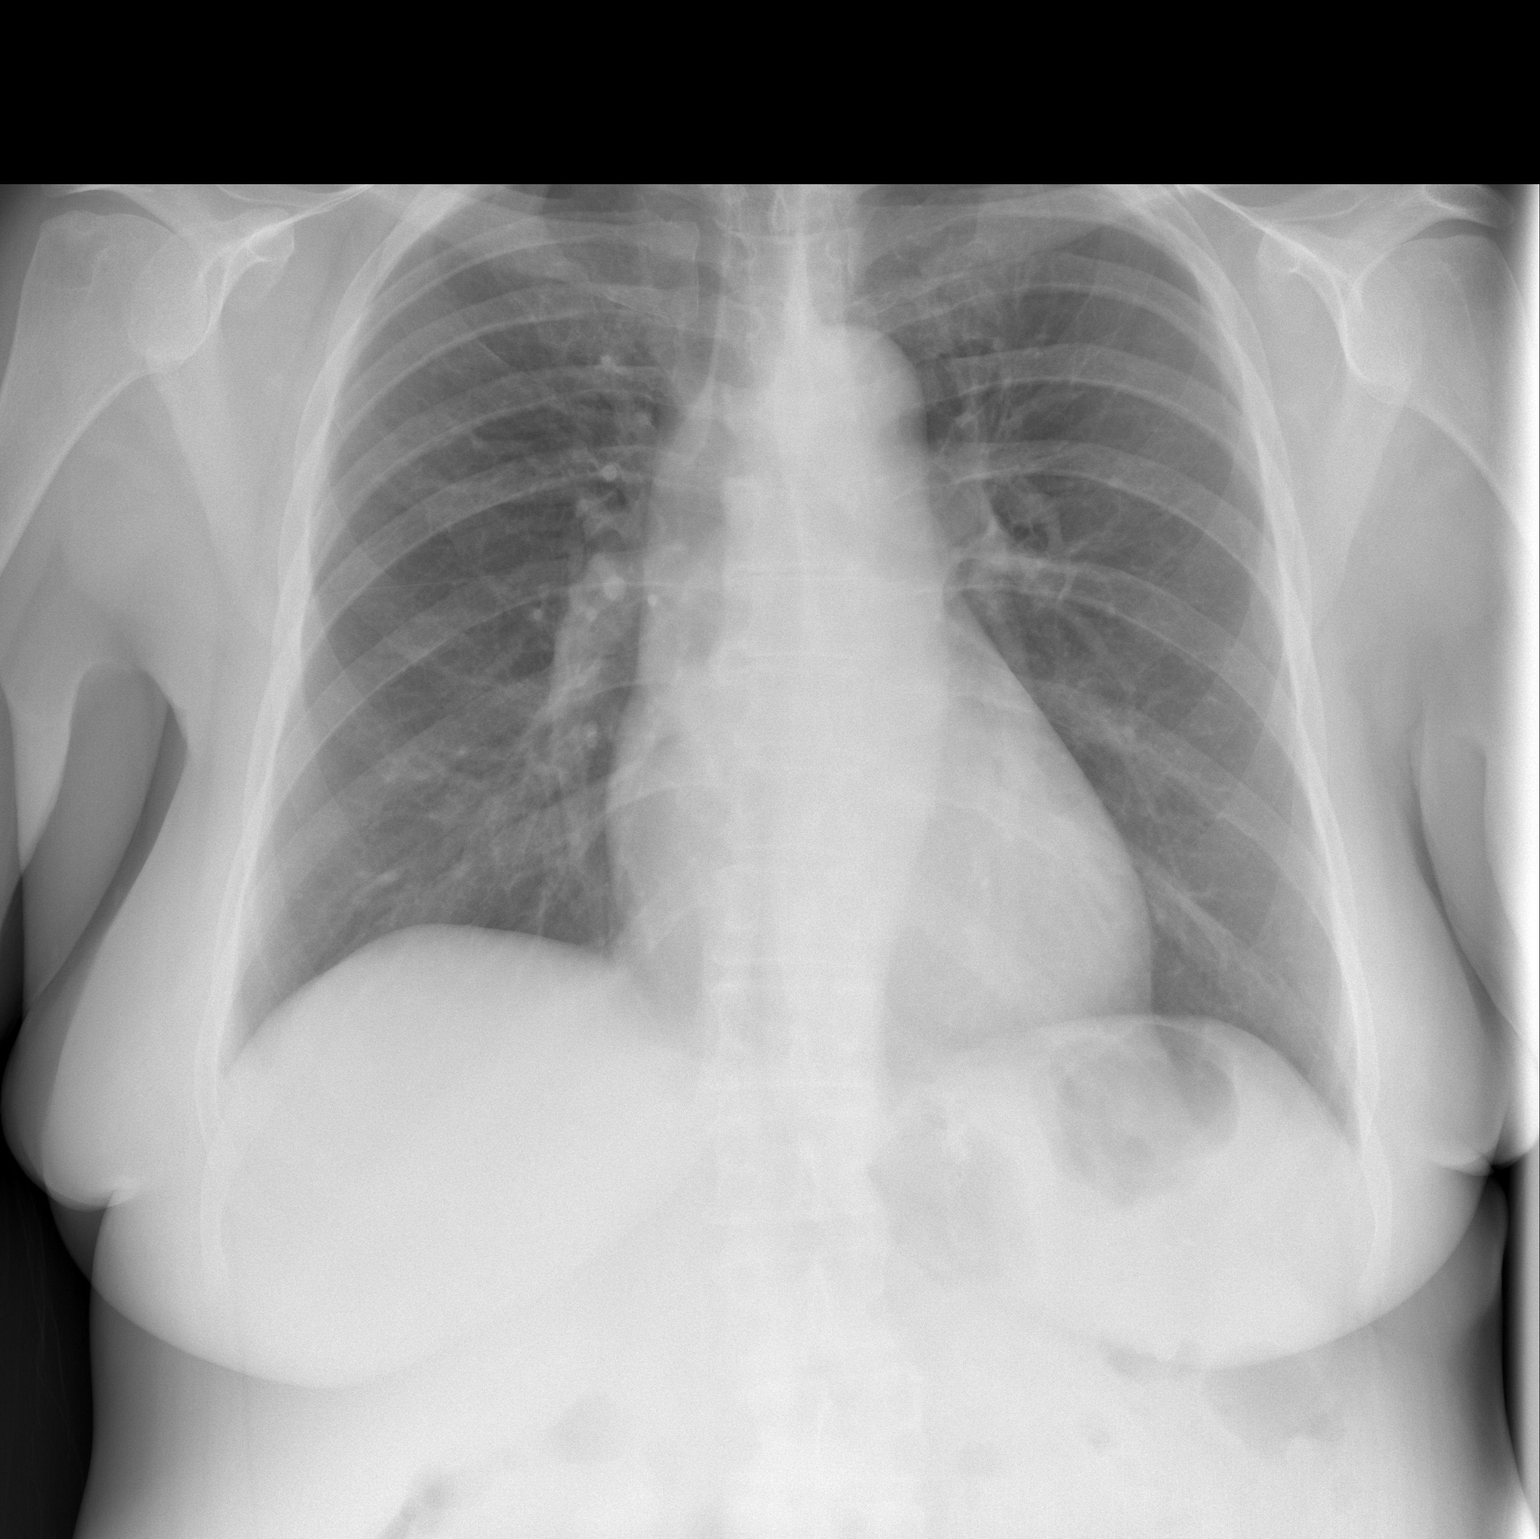

[w chest lat]
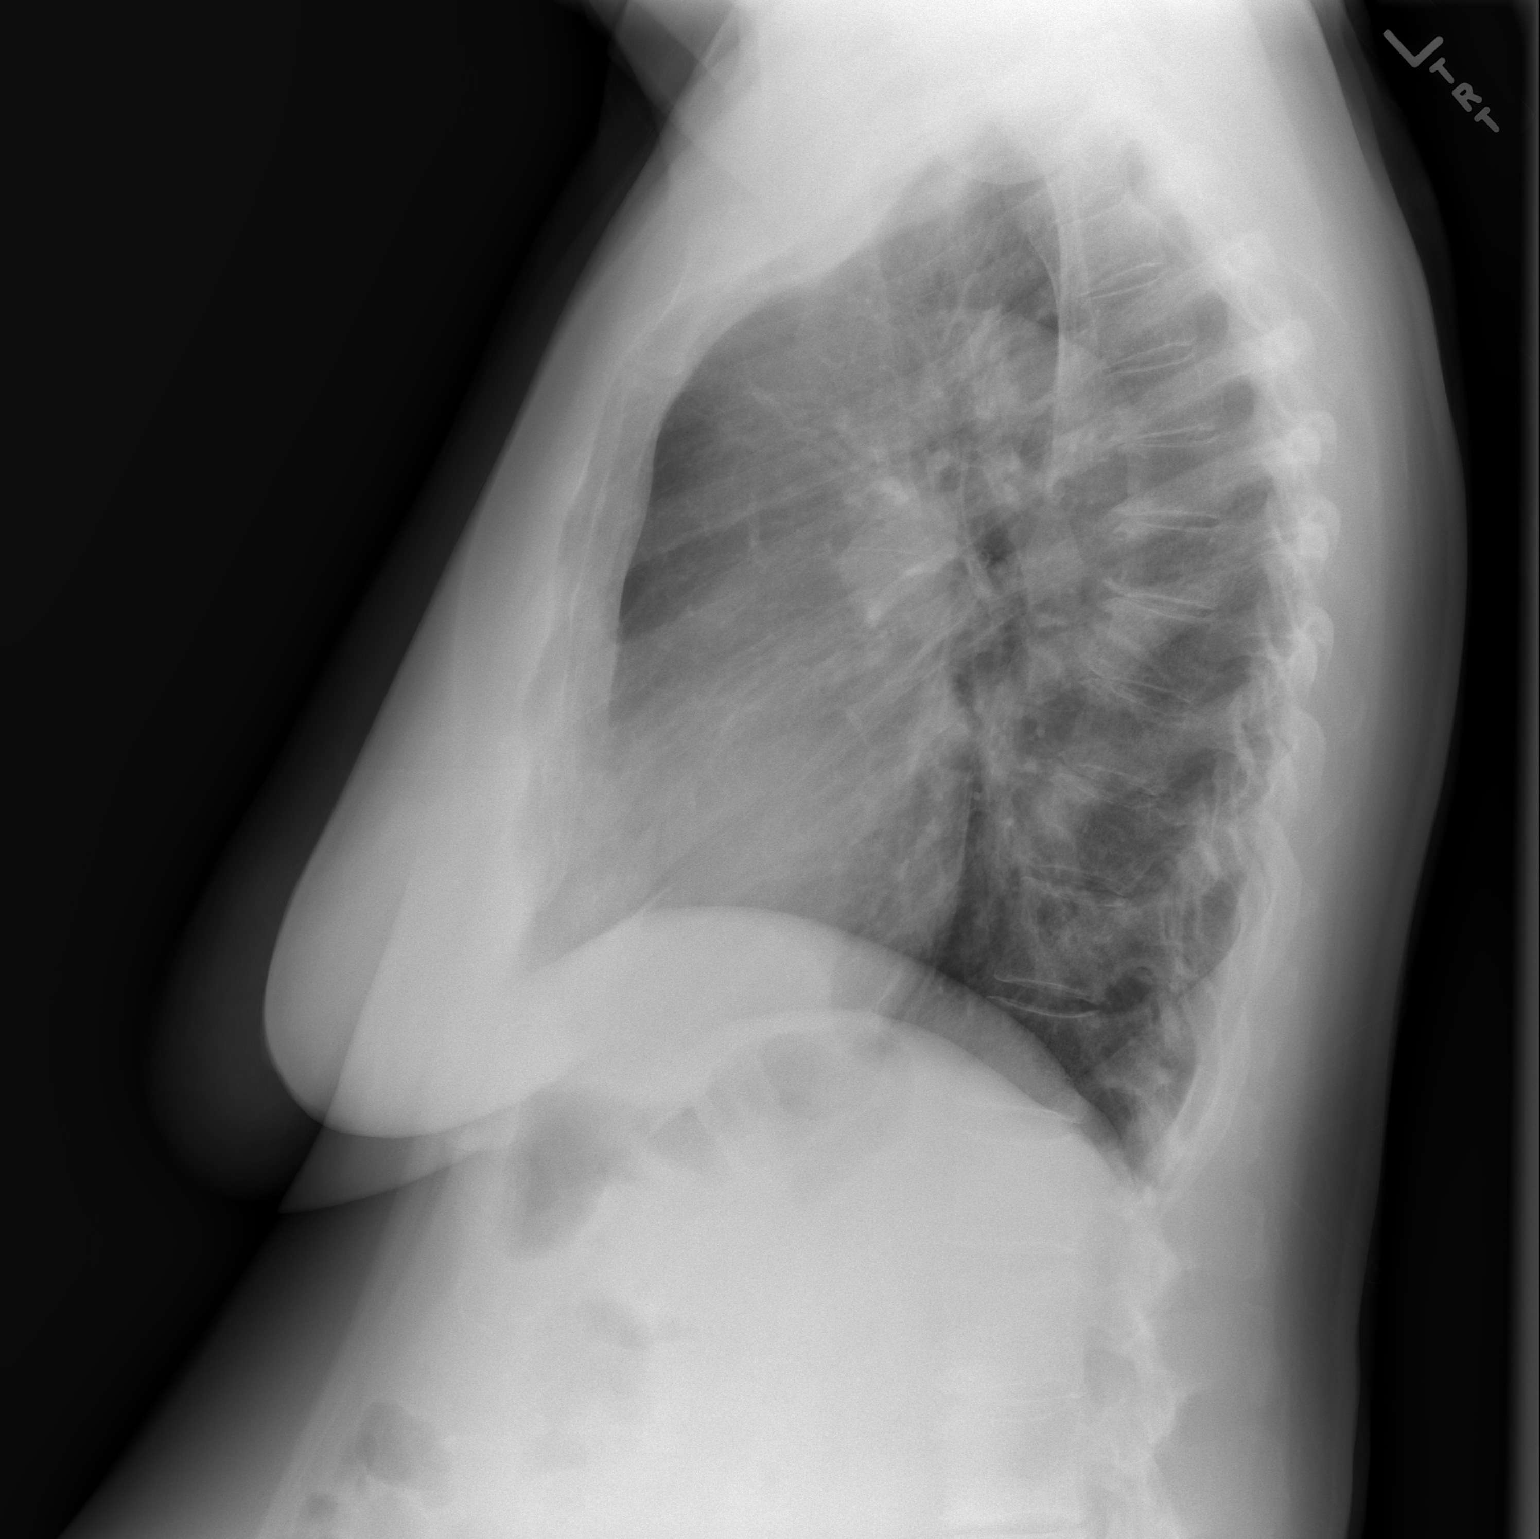

[2 of 2 positions shown; findings below may reference images not displayed]

FINDINGS: Stable cardiac and mediastinal contours. Lungs are clear. No pleural
effusion or pneumothorax. Mid thoracic spine degenerative change.
IMPRESSION: No acute cardiopulmonary process.

## 2014-09-22 IMAGING — RF DG ABDOMEN 1V
1 series · 8 of 8 positions shown · non-contrast
Comparison: CT 06/10/2013 at [HOSPITAL]

CLINICAL DATA: History of right percutaneous nephrostomy and stent
placement for removal of staghorn calculus.

EXAM:
ABDOMEN - 1 VIEW

[Series 1: run · 8 of 8 slices shown]
[im 1/8]
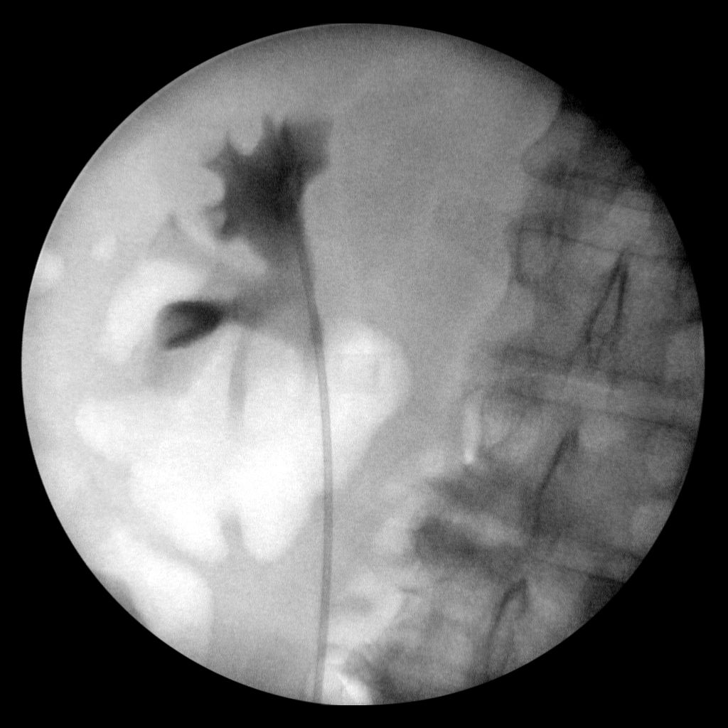
[im 2/8]
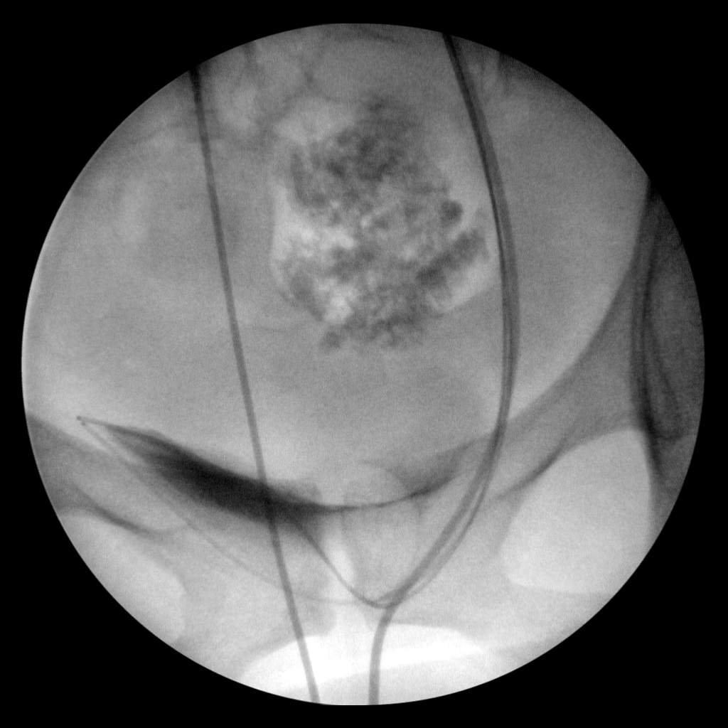
[im 3/8]
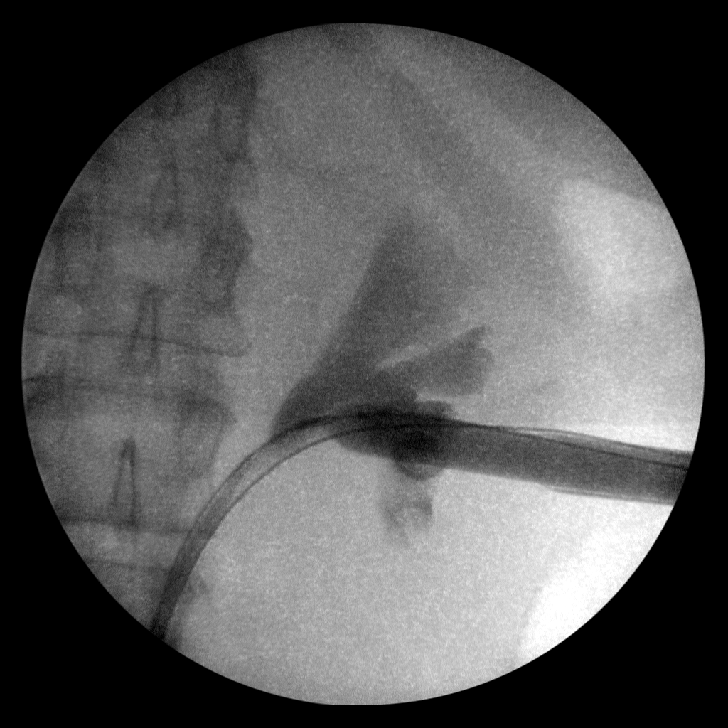
[im 4/8]
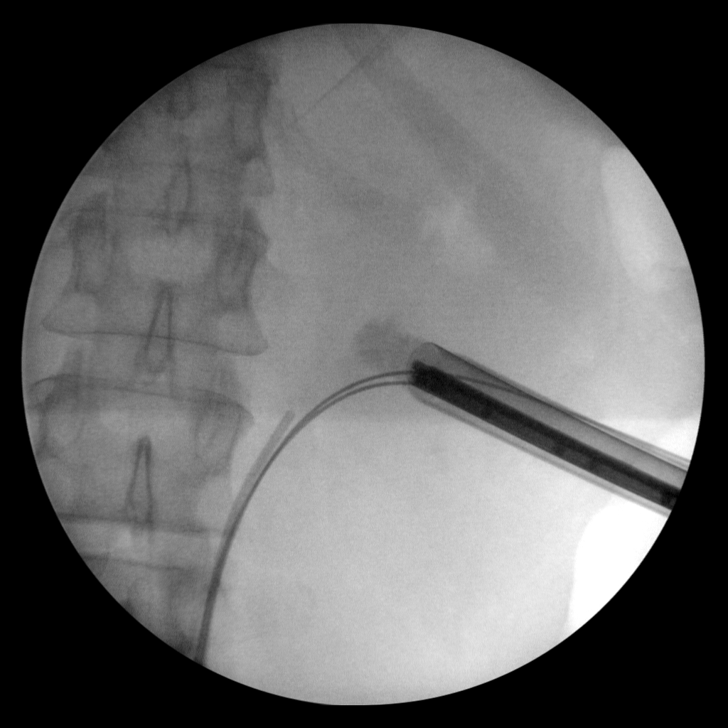
[im 5/8]
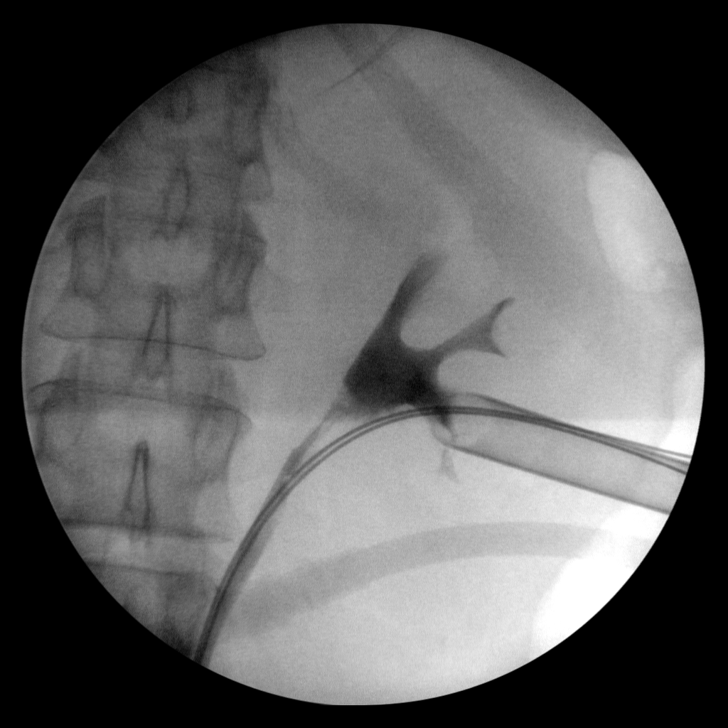
[im 6/8]
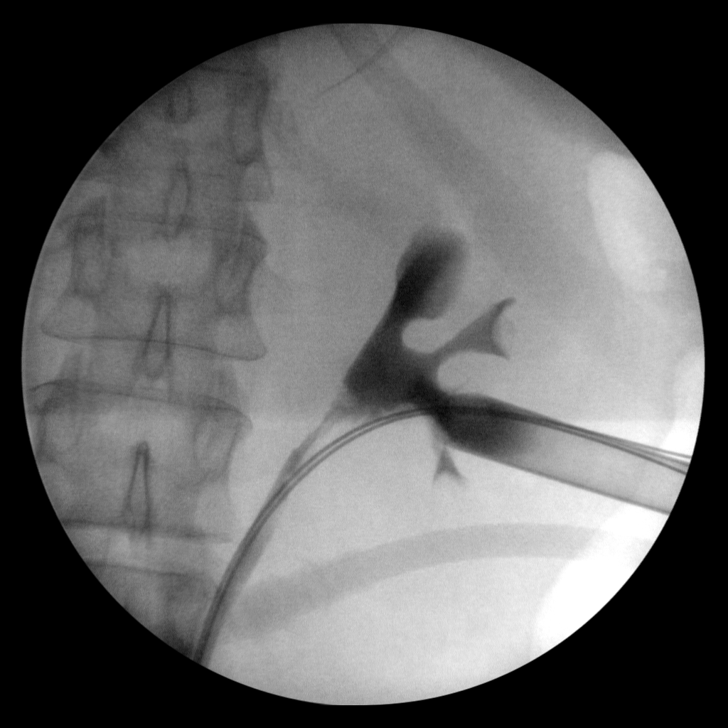
[im 7/8]
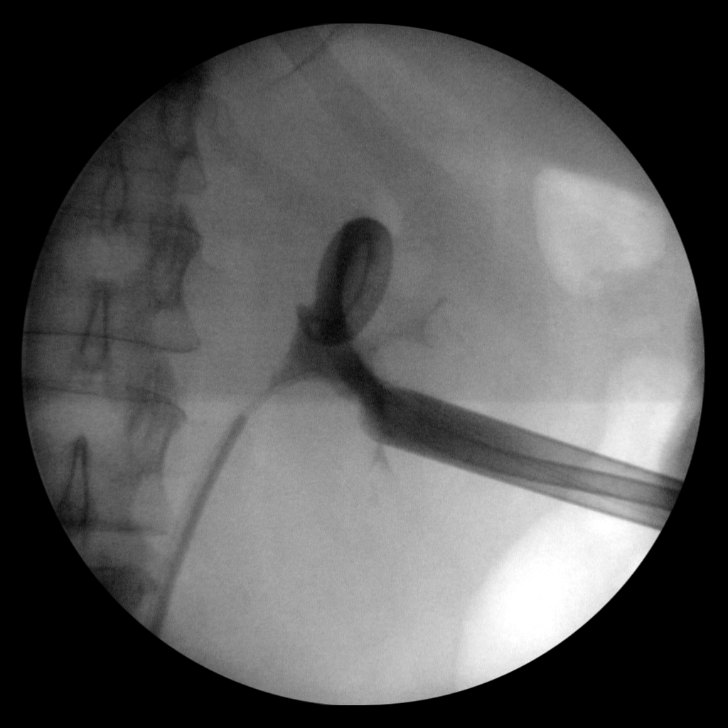
[im 8/8]
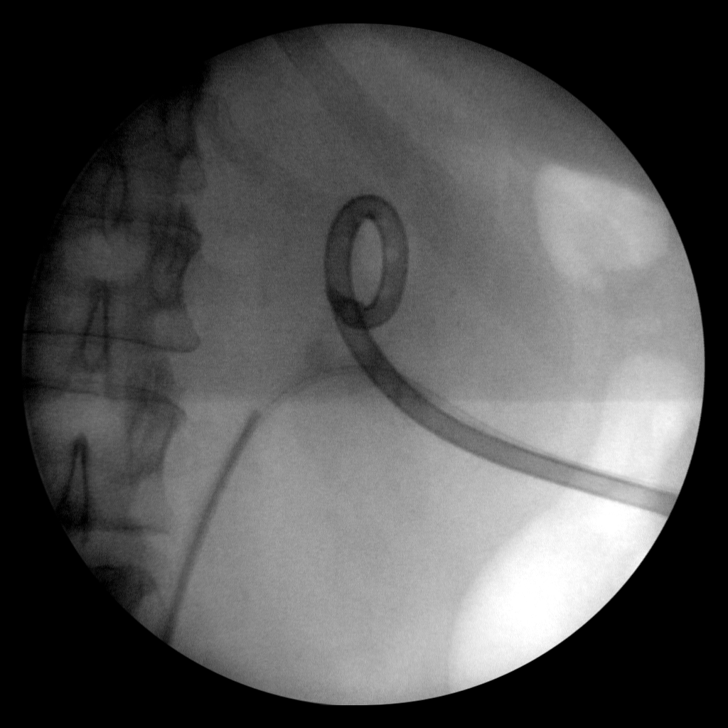

[8 of 8 positions shown; findings below may reference images not displayed]

FINDINGS: Intraprocedural images demonstrate opacification of the right renal
collecting system with percutaneous approach and ureteral stent
placement and nephrostomy tube placement. Subjective mild narrowing
at the right UPJ is noted.
IMPRESSION: Intraprocedural imaging as above.

## 2014-09-24 ENCOUNTER — Other Ambulatory Visit: Payer: Self-pay | Admitting: Family Medicine

## 2014-09-28 ENCOUNTER — Encounter: Payer: Self-pay | Admitting: Family Medicine

## 2014-09-28 NOTE — Telephone Encounter (Signed)
Electronic refill request. Last Filled:    30 tablet 0 RF on 08/18/2014  Only acute visits in 2015.   Please advise.

## 2014-09-28 NOTE — Telephone Encounter (Signed)
Please call in. Thanks.  Schedule CPE when possible.

## 2014-09-29 NOTE — Telephone Encounter (Signed)
Medication phoned to pharmacy. Left detailed message on voicemail.

## 2014-11-06 ENCOUNTER — Ambulatory Visit (INDEPENDENT_AMBULATORY_CARE_PROVIDER_SITE_OTHER): Payer: PRIVATE HEALTH INSURANCE | Admitting: Family Medicine

## 2014-11-06 ENCOUNTER — Encounter: Payer: Self-pay | Admitting: Family Medicine

## 2014-11-06 VITALS — BP 128/76 | HR 79 | Temp 98.7°F | Wt 235.2 lb

## 2014-11-06 DIAGNOSIS — F411 Generalized anxiety disorder: Secondary | ICD-10-CM

## 2014-11-06 MED ORDER — OMEPRAZOLE 20 MG PO CPDR: 20.0000 mg | DELAYED_RELEASE_CAPSULE | Freq: Two times a day (BID) | ORAL | Status: DC | PRN

## 2014-11-06 MED ORDER — FLUOXETINE HCL 20 MG PO CAPS: 20.0000 mg | ORAL_CAPSULE | Freq: Every day | ORAL | Status: DC

## 2014-11-06 MED ORDER — LORAZEPAM 0.5 MG PO TABS: ORAL_TABLET | ORAL | Status: DC

## 2014-11-06 NOTE — Patient Instructions (Signed)
Let me know how you are doing in a few weeks, sooner if needed.  Restart prozac in the meantime.  We may need to add on wellbutrin later.  Let me know if you need help with bariatric follow up or labs.   Take care.  Glad to see you.

## 2014-11-06 NOTE — Progress Notes (Signed)
Pre visit review using our clinic review tool, if applicable. No additional management support is needed unless otherwise documented below in the visit note.  Prev was on 300mg  wellbutrin and 20mg  prozac.  She had tapered off over the years, after her gastric bypass.  She was asking about going back on meds.  Some days are better than others, but some days she has sig feeling of being overwhelmed and anxious.  Her son is getting divorced.  "When do I draw the line on going back on medicines?"   H/o depression and anxiety.  She has no SI/HI.  She had needed lorazepam at night due to nighttime waking and recurrent thoughts qhs.  She has needed that more recently, as a marker of not doing as well as prev.    Meds, vitals, and allergies reviewed.   ROS: See HPI.  Otherwise, noncontributory.  nad ncat Mmm Neck supple rrr ctab Ext well perfused.

## 2014-11-08 NOTE — Assessment & Plan Note (Signed)
Continue prn BZD, but restart prozac.  We can add on wellbutrin later if needed.  She agrees. Still okay for outpatient f/u.  >15 minutes spent in face to face time with patient, >50% spent in counselling or coordination of care.  She'll update me on her condition.  We talked about the routine timeline for benefit from SSRI start.  She agrees.

## 2015-01-25 LAB — HM MAMMOGRAPHY: HM Mammogram: NORMAL

## 2015-02-05 ENCOUNTER — Telehealth: Payer: Self-pay | Admitting: *Deleted

## 2015-02-05 NOTE — Telephone Encounter (Signed)
Called patient to schedule screening mammogram.  She states she has appointment scheduled on 02/19/15.

## 2015-02-19 ENCOUNTER — Other Ambulatory Visit: Payer: Self-pay | Admitting: Obstetrics and Gynecology

## 2015-02-23 LAB — CYTOLOGY - PAP

## 2015-03-30 ENCOUNTER — Telehealth: Payer: Self-pay | Admitting: Family Medicine

## 2015-03-30 DIAGNOSIS — D649 Anemia, unspecified: Secondary | ICD-10-CM

## 2015-03-30 NOTE — Telephone Encounter (Signed)
Patient saw Dr.Grewal on 02/19/15.  Patient's hemoglobin was 9.  Dr Helane Rima told patient to follow up with lab work in 4-6 weeks. Dr.Grewal told patient she could have it done at her office or go to her PCP.  Patient would like to have the lab work done at our office since we're closer to her home.  Does patient need an appointment with Dr.Duncan or can lab work be ordered? Patient would like to have the lab work done by Friday.

## 2015-03-31 NOTE — Addendum Note (Signed)
Addended by: Tonia Ghent on: 03/31/2015 11:34 AM   Modules accepted: Orders

## 2015-03-31 NOTE — Telephone Encounter (Signed)
I'm okay with this.  I put in the order.  Thanks.

## 2015-04-02 ENCOUNTER — Other Ambulatory Visit (INDEPENDENT_AMBULATORY_CARE_PROVIDER_SITE_OTHER): Payer: PRIVATE HEALTH INSURANCE

## 2015-04-02 DIAGNOSIS — D649 Anemia, unspecified: Secondary | ICD-10-CM

## 2015-04-02 LAB — CBC WITH DIFFERENTIAL/PLATELET
Basophils Absolute: 0 10*3/uL (ref 0.0–0.1)
Basophils Relative: 0.5 % (ref 0.0–3.0)
Eosinophils Absolute: 0.1 10*3/uL (ref 0.0–0.7)
Eosinophils Relative: 1.6 % (ref 0.0–5.0)
HCT: 30.7 % — ABNORMAL LOW (ref 36.0–46.0)
Hemoglobin: 9.6 g/dL — ABNORMAL LOW (ref 12.0–15.0)
Lymphocytes Relative: 20.4 % (ref 12.0–46.0)
Lymphs Abs: 1.5 10*3/uL (ref 0.7–4.0)
MCHC: 31.2 g/dL (ref 30.0–36.0)
MCV: 70 fl — ABNORMAL LOW (ref 78.0–100.0)
Monocytes Absolute: 0.6 10*3/uL (ref 0.1–1.0)
Monocytes Relative: 8.3 % (ref 3.0–12.0)
Neutro Abs: 5.2 10*3/uL (ref 1.4–7.7)
Neutrophils Relative %: 69.2 % (ref 43.0–77.0)
Platelets: 436 10*3/uL — ABNORMAL HIGH (ref 150.0–400.0)
RBC: 4.38 Mil/uL (ref 3.87–5.11)
RDW: 18.7 % — ABNORMAL HIGH (ref 11.5–15.5)
WBC: 7.6 10*3/uL (ref 4.0–10.5)

## 2015-04-08 ENCOUNTER — Telehealth: Payer: Self-pay

## 2015-04-08 NOTE — Telephone Encounter (Signed)
Pt left v/m wanting to know if could add a serotonin level added to labs done on 04/02/15 or would pt need to ck with other office to do lab. Terri in lab said cannot add on serotonin level. Left v/m as instructed by pt with info cannot add on serotonin.

## 2015-06-21 ENCOUNTER — Telehealth: Payer: Self-pay | Admitting: *Deleted

## 2015-06-21 NOTE — Telephone Encounter (Signed)
Left message advising pt to call in regards to mammogram.

## 2015-06-22 ENCOUNTER — Telehealth: Payer: Self-pay | Admitting: Family Medicine

## 2015-06-22 ENCOUNTER — Encounter: Payer: Self-pay | Admitting: Family Medicine

## 2015-06-22 NOTE — Telephone Encounter (Signed)
Pt returned call regarding mammogram. She sates that she had mammogram in May 2016 when she had her annual obgyn appt.  It was set up by [facility].

## 2015-06-22 NOTE — Telephone Encounter (Signed)
error 

## 2015-06-28 ENCOUNTER — Other Ambulatory Visit: Payer: Self-pay | Admitting: Family Medicine

## 2015-06-29 NOTE — Telephone Encounter (Signed)
Received a refill request for Lorazepam. Last refilled 11/06/14 for #30 with 1 refill. Ok to refill?

## 2015-06-29 NOTE — Telephone Encounter (Signed)
Rx called in as directed.   

## 2015-06-29 NOTE — Telephone Encounter (Signed)
Please call in.  Thanks.   

## 2016-02-05 ENCOUNTER — Other Ambulatory Visit: Payer: Self-pay | Admitting: Family Medicine

## 2016-03-06 ENCOUNTER — Other Ambulatory Visit: Payer: Self-pay | Admitting: Family Medicine

## 2016-04-04 ENCOUNTER — Other Ambulatory Visit: Payer: Self-pay | Admitting: Family Medicine

## 2016-04-28 ENCOUNTER — Encounter: Payer: Self-pay | Admitting: Family Medicine

## 2016-04-28 ENCOUNTER — Ambulatory Visit (INDEPENDENT_AMBULATORY_CARE_PROVIDER_SITE_OTHER): Payer: PRIVATE HEALTH INSURANCE | Admitting: Family Medicine

## 2016-04-28 VITALS — BP 112/76 | HR 75 | Temp 98.6°F | Wt 230.8 lb

## 2016-04-28 DIAGNOSIS — Z9884 Bariatric surgery status: Secondary | ICD-10-CM

## 2016-04-28 DIAGNOSIS — F411 Generalized anxiety disorder: Secondary | ICD-10-CM | POA: Diagnosis not present

## 2016-04-28 DIAGNOSIS — Z1211 Encounter for screening for malignant neoplasm of colon: Secondary | ICD-10-CM | POA: Diagnosis not present

## 2016-04-28 MED ORDER — LORAZEPAM 0.5 MG PO TABS: 0.5000 mg | ORAL_TABLET | Freq: Every day | ORAL | 1 refills | Status: DC | PRN

## 2016-04-28 MED ORDER — BUPROPION HCL ER (SR) 150 MG PO TB12: ORAL_TABLET | ORAL | 12 refills | Status: DC

## 2016-04-28 MED ORDER — FLUOXETINE HCL 20 MG PO CAPS: 20.0000 mg | ORAL_CAPSULE | Freq: Every day | ORAL | 12 refills | Status: DC

## 2016-04-28 NOTE — Patient Instructions (Addendum)
Go to the lab on the way out.  We'll contact you with your lab report. Take care.  Glad to see you.  Update me as needed.  Add on wellbutrin, 1 pill a day for about 3-5 days, increasing to twice a day thereafter if needed.

## 2016-04-28 NOTE — Progress Notes (Signed)
Pre visit review using our clinic review tool, if applicable. No additional management support is needed unless otherwise documented below in the visit note. 

## 2016-04-28 NOTE — Progress Notes (Signed)
Her parents are not well.  D/w pt.  They are both elderly and having ongoing chronic medical issues.  She has been on baseline meds.  Prev was on wellbutrin with current meds but not currently.  Work is still going well.  Taking ativan prn. No SI/HI.  Inc in anxiety noted.     She had bariatric labs done with Dr. Helane Rima recently.  I'll await records. D/w pt.   [facility], with HIV and HCV screening done then ~2010.    D/w patient [id number] for colon cancer screening, including IFOB vs. colonoscopy.  Risks and benefits of both were discussed and patient voiced understanding.  Pt elects for: IFOB.   Tetanus shot done about 10 years ago.  She'll check on her date on records at home.  D/w pt.    Flu shot encouraged.    Meds, vitals, and allergies reviewed.   ROS: Per HPI unless specifically indicated in ROS section   GEN: nad, alert and oriented HEENT: mucous membranes moist NECK: supple w/o LA CV: rrr. PULM: ctab, no inc wob ABD: soft, +bs EXT: no edema SKIN: no acute rash

## 2016-04-30 DIAGNOSIS — Z1211 Encounter for screening for malignant neoplasm of colon: Secondary | ICD-10-CM | POA: Insufficient documentation

## 2016-04-30 DIAGNOSIS — Z0000 Encounter for general adult medical examination without abnormal findings: Secondary | ICD-10-CM | POA: Insufficient documentation

## 2016-04-30 NOTE — Assessment & Plan Note (Signed)
D/w patient re:options for colon cancer screening, including IFOB vs. colonoscopy.  Risks and benefits of both were discussed and patient voiced understanding.  Pt elects for:IFOB  

## 2016-04-30 NOTE — Assessment & Plan Note (Signed)
She has been on baseline meds.  Prev was on wellbutrin with current meds but not currently.  Work is still going well.  Taking ativan prn. No SI/HI.  Inc in anxiety noted.  Would be reasonable to restart wellbutrin and have her update me as needed.  She agrees.  No other changes in meds. >25 minutes spent in face to face time with patient, >50% spent in counselling or coordination of care.

## 2016-04-30 NOTE — Assessment & Plan Note (Signed)
I'll await outside labs.  D/w pt.

## 2016-05-10 ENCOUNTER — Other Ambulatory Visit: Payer: Self-pay | Admitting: Family Medicine

## 2016-05-26 ENCOUNTER — Other Ambulatory Visit: Payer: Self-pay | Admitting: *Deleted

## 2016-05-26 NOTE — Telephone Encounter (Addendum)
Faxed refill request. Last office visit:   04/28/2016.  No OV prior since 08/2015.  Patient requests 90 day Rx.  Please advise.

## 2016-05-29 MED ORDER — FLUOXETINE HCL 20 MG PO CAPS: 20.0000 mg | ORAL_CAPSULE | Freq: Every day | ORAL | 3 refills | Status: DC

## 2016-05-29 NOTE — Telephone Encounter (Signed)
Sent. Thanks.   

## 2016-08-07 ENCOUNTER — Ambulatory Visit (INDEPENDENT_AMBULATORY_CARE_PROVIDER_SITE_OTHER): Payer: PRIVATE HEALTH INSURANCE | Admitting: Primary Care

## 2016-08-07 ENCOUNTER — Encounter: Payer: Self-pay | Admitting: Primary Care

## 2016-08-07 VITALS — BP 122/70 | HR 74 | Temp 98.3°F | Wt 230.8 lb

## 2016-08-07 DIAGNOSIS — J069 Acute upper respiratory infection, unspecified: Secondary | ICD-10-CM

## 2016-08-07 MED ORDER — BENZONATATE 200 MG PO CAPS: 200.0000 mg | ORAL_CAPSULE | Freq: Three times a day (TID) | ORAL | 0 refills | Status: DC | PRN

## 2016-08-07 NOTE — Progress Notes (Signed)
Pre visit review using our clinic review tool, if applicable. No additional management support is needed unless otherwise documented below in the visit note. 

## 2016-08-07 NOTE — Patient Instructions (Signed)
Nasal Congestion/Ear Pressure: Try using Flonase (fluticasone) nasal spray. Instill 1 spray in each nostril twice daily.   Cough: You may take Benzonatate capsules for cough. Take 1 capsule by mouth three times daily as needed for cough.   You could also try Delsym. This may be purchased over the counter.  Congestion: Continue taking Mucinex DM. This will help loosen up the mucous in your chest. Ensure you take this medication with a full glass of water.  It was a pleasure meeting you!

## 2016-08-07 NOTE — Progress Notes (Signed)
Subjective:    Patient ID: Carmen Buchanan, female    DOB: 1956-07-30, 60 y.o.   MRN: [id number]  HPI  Carmen Buchanan is a 60 year old female who presents today with a chief complaint of nasal congestion. She also reports cough, chest congestion, sore throat, headache. Her symptoms began Friday night (3 days ago). She's blowing clear mucous from her nasal cavity. She's been around her co-worker who was sick all last week.   She started taking Augmentin Saturday night which was prescribed by her dentist last week as she was reporting dental pain. She's been taking Mucinex and cough drops without much improvement. She denies fevers, chills, dental pain, productive cough.  Review of Systems  Constitutional: Positive for fatigue. Negative for chills and fever.  HENT: Positive for congestion, ear pain, sinus pressure and sore throat.   Respiratory: Positive for cough and shortness of breath.   Cardiovascular: Negative for chest pain.       Past Medical History:  Diagnosis Date  . Anxiety    much improved after weight loss started in late 2012  . Arthritis   . Complication of anesthesia    after hemorrhoid surgery-inability to sleep for 3 weeks  . GERD (gastroesophageal reflux disease)   . Hemorrhoid   . HTN (hypertension)    stopped meds after bariatric surgery  . IBS (irritable bowel syndrome)    intermittent  . Migraine, unspecified, without mention of intractable migraine without mention of status migrainosus   . Obesity   . Renal stones      Social History   Social History  . Marital status: Married    Spouse name: N/A  . Number of children: 1  . Years of education: N/A   Occupational History  . Dental Assistant    Social History Main Topics  . Smoking status: Never Smoker  . Smokeless tobacco: Never Used  . Alcohol use Yes     Comment: Minimal  . Drug use: No  . Sexual activity: Not on file   Other Topics Concern  . 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, church, time with family   Minimal exercise    Past Surgical History:  Procedure Laterality Date  . CESAREAN SECTION    . CYSTOSCOPY N/A 07/14/2013   Procedure: CYSTOSCOPY;  Surgeon: Alexis Frock, MD;  Location: [facility];  Service: Urology;  Laterality: N/A;  . GASTRIC BYPASS     06/2011 in [facility]  . LASER ABLATION CONDYLOMA CERVICAL / VULVAR  5/04   uncontrollable bleeding  . LUMBAR DISC SURGERY  7/01   L4-5; Dr. Rolin Barry  . NEPHROLITHOTOMY Right 07/14/2013   Procedure: NEPHROLITHOTOMY PERCUTANEOUS  Procedure: Right 1st stage Percutaneous Nephrostolithotomy with surgeon access and cystoscopy;  Surgeon: Alexis Frock, MD;  Location: [facility];  Service: Urology;  Laterality: Right;  . NEPHROLITHOTOMY Right 07/16/2013   Procedure: NEPHROLITHOTOMY PERCUTANEOUS SECOND LOOK, diagnostic ureteroscopy and stent placement on the right ;  Surgeon: Alexis Frock, MD;  Location: [facility];  Service: Urology;  Laterality: Right;  . TONSILLECTOMY AND ADENOIDECTOMY  1970    Family History  Problem Relation Age of Onset  . Coronary artery disease Father     MI; CABG  . Aortic aneurysm Mother     on betablocker  . Other Mother     Cardiovascualr disease  . Ulcers Mother   .  Colon polyps      Family history    Allergies  Allergen Reactions  . Ciprofloxacin     REACTION: nausea  . Morphine     REACTION: HA and NAV, but tolerates hydrocodone  . Nsaids     Due to gastric bypass    Current Outpatient Prescriptions on File Prior to Visit  Medication Sig Dispense Refill  . acetaminophen (TYLENOL) 500 MG tablet Take 1,000 mg by mouth every 6 (six) hours as needed for pain.     Marland Kitchen betamethasone dipropionate (DIPROLENE) 0.05 % cream Apply 1 application topically 2 (two) times daily as needed (rash).    Marland Kitchen buPROPion (WELLBUTRIN SR) 150 MG 12 hr tablet Take 1-2 pills per day 60 tablet 12  . FLUoxetine (PROZAC) 20 MG capsule  Take 1 capsule (20 mg total) by mouth daily. 90 capsule 3  . LORazepam (ATIVAN) 0.5 MG tablet Take 1 tablet (0.5 mg total) by mouth daily as needed. 30 tablet 1  . Multiple Vitamin (MULTIVITAMIN WITH MINERALS) TABS tablet Take 1 tablet by mouth daily.    Marland Kitchen omeprazole (PRILOSEC) 20 MG capsule TAKE 1 CAPSULE (20 MG TOTAL) BY MOUTH 2 (TWO) TIMES DAILY AS NEEDED (HEART BURN). 30 capsule 5  . polyethylene glycol (MIRALAX / GLYCOLAX) packet Take 17 g by mouth daily.     No current facility-administered medications on file prior to visit.     BP 122/70   Pulse 74   Temp 98.3 F (36.8 C) (Oral)   Wt 230 lb 12.8 oz (104.7 kg)   SpO2 96%   BMI 34.08 kg/m    Objective:   Physical Exam  Constitutional: She appears well-nourished.  HENT:  Right Ear: Tympanic membrane and ear canal normal.  Left Ear: Tympanic membrane and ear canal normal.  Nose: Right sinus exhibits maxillary sinus tenderness. Right sinus exhibits no frontal sinus tenderness. Left sinus exhibits maxillary sinus tenderness. Left sinus exhibits no frontal sinus tenderness.  Mouth/Throat: Oropharynx is clear and moist.  Eyes: Conjunctivae are normal.  Neck: Neck supple.  Cardiovascular: Normal rate and regular rhythm.   Pulmonary/Chest: Effort normal and breath sounds normal. She has no wheezes. She has no rales.  Lymphadenopathy:    She has no cervical adenopathy.  Skin: Skin is warm and dry.          Assessment & Plan:  Viral Sinusitis:  Sinus pressure, nasal congestion, fatigue x 3 days. Has been taking Augmentin (2 doses total) since Saturday prescribed by a dentist she works with. Exam today with moderate maxillary tenderness, otherwise unremarkable. Do not believe she has bacterial involvement at this point and recommended she stop Augmentin. Discussed OTC treatment including Flonase, Neti Pot rinses, Mucinex. Rx for Tessalon pearls sent for cough. Fluids, rest.  Advised to restart Augmentin Saturday this coming  weekend if symptoms do not resolve or become worse. Suspect she will continue Augmentin despite recommendations.  Sheral Flow, NP

## 2016-11-08 ENCOUNTER — Other Ambulatory Visit: Payer: Self-pay | Admitting: Family Medicine

## 2016-11-08 ENCOUNTER — Telehealth: Payer: Self-pay

## 2016-11-08 DIAGNOSIS — F411 Generalized anxiety disorder: Secondary | ICD-10-CM

## 2016-11-08 MED ORDER — BUPROPION HCL ER (XL) 150 MG PO TB24: 150.0000 mg | ORAL_TABLET | Freq: Every day | ORAL | 0 refills | Status: DC

## 2016-11-08 NOTE — Telephone Encounter (Signed)
Ok to refill? Electronically refill request for lorazepam   tablet # 30    with  1  refill  Last prescribed on 04/28/16. Last for anxiety seen on 04/28/16, but she had an acute with Anda Kraft in 08/07/16. No f/u appt scheduled.

## 2016-11-08 NOTE — Telephone Encounter (Signed)
Noted, changed Rx to bupropion XL 150 mg. Inform patient to take 1 tablet by mouth every morning. Please notify her that this was sent in to her pharmacy.

## 2016-11-08 NOTE — Telephone Encounter (Signed)
Pt left v/m; pts ins will not approve bupropion SR 150 mg but the ins will cover bupropion XL. [facility]. Pt request cb when completed.

## 2016-11-08 NOTE — Telephone Encounter (Signed)
Rx called to pharmacy as instructed. 

## 2016-11-08 NOTE — Telephone Encounter (Signed)
No inappropriate use based off of [place] controlled substance registry. Okay to refill. No obvious abuse.

## 2016-11-09 NOTE — Telephone Encounter (Signed)
Left voicemail letting pt know Rx sent and to take 1 tab every morning

## 2017-04-18 ENCOUNTER — Other Ambulatory Visit: Payer: Self-pay | Admitting: Family Medicine

## 2017-05-04 ENCOUNTER — Encounter: Payer: Self-pay | Admitting: Family Medicine

## 2017-05-04 ENCOUNTER — Ambulatory Visit (INDEPENDENT_AMBULATORY_CARE_PROVIDER_SITE_OTHER): Payer: PRIVATE HEALTH INSURANCE | Admitting: Family Medicine

## 2017-05-04 VITALS — BP 112/68 | HR 80 | Temp 99.2°F | Wt 233.0 lb

## 2017-05-04 DIAGNOSIS — F411 Generalized anxiety disorder: Secondary | ICD-10-CM

## 2017-05-04 DIAGNOSIS — Z862 Personal history of diseases of the blood and blood-forming organs and certain disorders involving the immune mechanism: Secondary | ICD-10-CM | POA: Diagnosis not present

## 2017-05-04 DIAGNOSIS — Z9884 Bariatric surgery status: Secondary | ICD-10-CM

## 2017-05-04 LAB — CBC WITH DIFFERENTIAL/PLATELET
Basophils Absolute: 0 cells/uL (ref 0–200)
Basophils Relative: 0 %
Eosinophils Absolute: 168 cells/uL (ref 15–500)
Eosinophils Relative: 2 %
HCT: 38.2 % (ref 35.0–45.0)
Hemoglobin: 12.7 g/dL (ref 11.7–15.5)
Lymphocytes Relative: 29 %
Lymphs Abs: 2436 cells/uL (ref 850–3900)
MCH: 27.8 pg (ref 27.0–33.0)
MCHC: 33.2 g/dL (ref 32.0–36.0)
MCV: 83.6 fL (ref 80.0–100.0)
MPV: 9.4 fL (ref 7.5–12.5)
Monocytes Absolute: 672 cells/uL (ref 200–950)
Monocytes Relative: 8 %
Neutro Abs: 5124 cells/uL (ref 1500–7800)
Neutrophils Relative %: 61 %
Platelets: 387 10*3/uL (ref 140–400)
RBC: 4.57 MIL/uL (ref 3.80–5.10)
RDW: 14.9 % (ref 11.0–15.0)
WBC: 8.4 10*3/uL (ref 3.8–10.8)

## 2017-05-04 MED ORDER — BUPROPION HCL ER (SR) 150 MG PO TB12: 150.0000 mg | ORAL_TABLET | Freq: Two times a day (BID) | ORAL | 3 refills | Status: DC

## 2017-05-04 MED ORDER — LORAZEPAM 0.5 MG PO TABS: 0.5000 mg | ORAL_TABLET | Freq: Every day | ORAL | 2 refills | Status: DC | PRN

## 2017-05-04 MED ORDER — FLUOXETINE HCL 20 MG PO CAPS: 20.0000 mg | ORAL_CAPSULE | Freq: Every day | ORAL | 3 refills | Status: DC

## 2017-05-04 MED ORDER — OMEPRAZOLE 20 MG PO CPDR: DELAYED_RELEASE_CAPSULE | ORAL | 3 refills | Status: DC

## 2017-05-04 NOTE — Progress Notes (Signed)
Mother had a CVA in 09/2016.  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  Her mother is debilitated and requires a lot of care, has palliative care following her.  She admits to being a stress eater but is working on that.  She is still on baseline meds.  No ADE on meds but higher stress noted.   She had prev been on higher doses of SSRI and wellbutrin prev.   No SI/HI.    Patient has a new grandbaby on the way, due 06/2017.    She has h/o anemia, s/p iron infusion ~2 years ago, prev managed by GYN clinic.  Due fur f/u labs.  We talked about colonoscopy previously but she had to defer this to the family commitment, see above. Discussed with her about trying to schedule this when possible.  She has seen Dr. Helane Rima with [facility].  PMH and SH reviewed  ROS: Per HPI unless specifically indicated in ROS section   Meds, vitals, and allergies reviewed.   GEN: nad, alert and oriented HEENT: mucous membranes moist NECK: supple w/o LA CV: rrr PULM: ctab, no inc wob ABD: soft, +bs EXT: no edema SKIN: no acute rash

## 2017-05-04 NOTE — Patient Instructions (Signed)
Go to the lab on the way out.  We'll contact you with your lab report. Try the higher dose of wellbutrin in the meantime.  We may need to increase the prozac later on.  Take care.  Glad to see you.  Update me in a few weeks, sooner if needed.  Thanks for your effort.

## 2017-05-05 LAB — IRON AND TIBC
%SAT: 6 % — ABNORMAL LOW (ref 11–50)
Iron: 28 ug/dL — ABNORMAL LOW (ref 45–160)
TIBC: 452 ug/dL — ABNORMAL HIGH (ref 250–450)
UIBC: 424 ug/dL

## 2017-05-05 LAB — COMPREHENSIVE METABOLIC PANEL
ALT: 12 U/L (ref 6–29)
AST: 18 U/L (ref 10–35)
Albumin: 4.1 g/dL (ref 3.6–5.1)
Alkaline Phosphatase: 115 U/L (ref 33–130)
BUN: 16 mg/dL (ref 7–25)
CO2: 25 mmol/L (ref 20–32)
Calcium: 9.5 mg/dL (ref 8.6–10.4)
Chloride: 103 mmol/L (ref 98–110)
Creat: 0.88 mg/dL (ref 0.50–1.05)
Glucose, Bld: 85 mg/dL (ref 65–99)
Potassium: 4.8 mmol/L (ref 3.5–5.3)
Sodium: 139 mmol/L (ref 135–146)
Total Bilirubin: 0.4 mg/dL (ref 0.2–1.2)
Total Protein: 6.8 g/dL (ref 6.1–8.1)

## 2017-05-05 LAB — LIPID PANEL
Cholesterol: 158 mg/dL (ref <200)
HDL: 62 mg/dL (ref >50)
LDL Cholesterol: 81 mg/dL (ref <100)
Total CHOL/HDL Ratio: 2.5 Ratio (ref <5.0)
Triglycerides: 76 mg/dL (ref <150)
VLDL: 15 mg/dL (ref <30)

## 2017-05-06 NOTE — Assessment & Plan Note (Signed)
She has significant stressors. No suicidal or homicidal intent. Discussed with patient about options. Increase Wellbutrin in the meantime. We may need to increase fluoxetine later on. Rationale discussed with patient. She agrees. She will update me to let me know how she is doing. >25 minutes spent in face to face time with patient, >50% spent in counselling or coordination of care.

## 2017-05-06 NOTE — Assessment & Plan Note (Signed)
With history of anemia noted. Recheck routine labs today. See notes on labs.

## 2017-05-07 LAB — TRANSFERRIN: Transferrin: 330 mg/dL (ref 188–341)

## 2017-05-08 ENCOUNTER — Telehealth: Payer: Self-pay

## 2017-05-08 NOTE — Telephone Encounter (Signed)
Please have her check the voice mail- see result note.  If she can't tolerate oral iron then let me know. If she can tolerate oral iron, then we can recheck in about 2 months.  Would be good to get colonoscopy done when possible, but she has mult demands with family. Thanks.

## 2017-05-08 NOTE — Telephone Encounter (Signed)
Pt left v/m; pt saw lab results on mychart and pt thinks iron level is too low and wants to know what to do; few years ago pt had iron infusion;  Pt request cb.

## 2017-05-08 NOTE — Telephone Encounter (Signed)
Patient advised.  Patient will call in for a lab appt in 2 months.

## 2017-11-20 ENCOUNTER — Other Ambulatory Visit: Payer: Self-pay | Admitting: Family Medicine

## 2017-11-20 NOTE — Telephone Encounter (Signed)
Electronic refill request. Lorazepam Last office visit:   05/04/2017 Last Filled:    30 tablet 2 05/04/2017  Please advise.

## 2017-11-21 NOTE — Telephone Encounter (Signed)
Sent. Thanks.   

## 2018-04-15 ENCOUNTER — Other Ambulatory Visit: Payer: Self-pay | Admitting: Family Medicine

## 2018-04-16 NOTE — Telephone Encounter (Signed)
Sent. Thanks.  cpe when possible.

## 2018-04-16 NOTE — Telephone Encounter (Signed)
Name of Medication: Lorazepam Name of [facility] [place] Last Fill or Written Date and Quantity:  30 tablet 2 11/21/2017  Last Office Visit and Type: 05/04/17 Anxiety Next Office Visit and Type: None Last Controlled Substance Agreement Date: None Last UDS: None

## 2018-04-17 NOTE — Telephone Encounter (Signed)
Please schedule appointment as instructed. 

## 2018-04-17 NOTE — Telephone Encounter (Signed)
Left message for patient to call back-Mardee Clune [place], RMA  

## 2018-04-23 NOTE — Telephone Encounter (Signed)
Pt states she has her physicals done with her OBGYN. She did schedule for 05/10/18 with Dr. Damita Dunnings for an office visit.

## 2018-04-23 NOTE — Telephone Encounter (Signed)
Appointment made by Angelene Giovanni, RMA

## 2018-05-10 ENCOUNTER — Ambulatory Visit: Payer: BLUE CROSS/BLUE SHIELD | Admitting: Family Medicine

## 2018-05-10 ENCOUNTER — Encounter: Payer: Self-pay | Admitting: Family Medicine

## 2018-05-10 VITALS — BP 138/82 | HR 77 | Temp 98.8°F | Ht 69.0 in | Wt 239.5 lb

## 2018-05-10 DIAGNOSIS — Z9884 Bariatric surgery status: Secondary | ICD-10-CM

## 2018-05-10 DIAGNOSIS — R5383 Other fatigue: Secondary | ICD-10-CM | POA: Diagnosis not present

## 2018-05-10 DIAGNOSIS — J069 Acute upper respiratory infection, unspecified: Secondary | ICD-10-CM

## 2018-05-10 DIAGNOSIS — E611 Iron deficiency: Secondary | ICD-10-CM | POA: Diagnosis not present

## 2018-05-10 DIAGNOSIS — F411 Generalized anxiety disorder: Secondary | ICD-10-CM | POA: Diagnosis not present

## 2018-05-10 MED ORDER — BUPROPION HCL ER (SR) 150 MG PO TB12: 150.0000 mg | ORAL_TABLET | Freq: Two times a day (BID) | ORAL | 3 refills | Status: DC

## 2018-05-10 MED ORDER — FLUOXETINE HCL 40 MG PO CAPS: 80.0000 mg | ORAL_CAPSULE | Freq: Every day | ORAL | Status: DC

## 2018-05-10 MED ORDER — FLUOXETINE HCL 40 MG PO CAPS
80.0000 mg | ORAL_CAPSULE | Freq: Every day | ORAL | 3 refills | Status: DC
Start: 2018-05-10 — End: 2019-07-14

## 2018-05-10 MED ORDER — FLUTICASONE PROPIONATE 50 MCG/ACT NA SUSP: 2.0000 | Freq: Every day | NASAL | 6 refills | Status: DC

## 2018-05-10 MED ORDER — OMEPRAZOLE 20 MG PO CPDR: DELAYED_RELEASE_CAPSULE | ORAL | 3 refills | Status: DC

## 2018-05-10 NOTE — Patient Instructions (Addendum)
Don't change your meds for now.  Go to the lab on the way out.  We'll contact you with your lab report. Schedule a physical when possible.  Take care.  Glad to see you.  Try flonase and gently try to pop your ears.

## 2018-05-10 NOTE — Progress Notes (Signed)
ST started this AM.  R ear stuffy but not painful.  Some pressure. No fevers.  Possible sick exposures at work.    Anxiety.  Still on baseline meds.   No ADE on meds.  Used BZD prn.   Both of her parents died in the last year. Condolences offered.   She is in a new job and that was a noted transition.   She had increased to 80mg  of fluoxetine in the meantime, a few months ago.   No SI/HI.    She thought she was better off with her current meds.    She isn't on iron currently, h/o low iron.  Fatigue noted.  No blood in stools.  No VB, no blood in urine.    PMH and SH reviewed  ROS: Per HPI unless specifically indicated in ROS section   Meds, vitals, and allergies reviewed.   GEN: nad, alert and oriented HEENT: mucous membranes moist, tm w/o erythema, nasal exam w/o erythema, clear discharge noted,  OP with cobblestoning, R ETD noted on exam.  NECK: supple w/o LA CV: rrr.   PULM: ctab, no inc wob EXT: no edema SKIN: no acute rash Voice is slightly hoarse.  Sinuses not ttp.

## 2018-05-12 DIAGNOSIS — J069 Acute upper respiratory infection, unspecified: Secondary | ICD-10-CM | POA: Insufficient documentation

## 2018-05-12 NOTE — Assessment & Plan Note (Signed)
Nontoxic.  Okay for outpatient follow-up.  Supportive care.  Use Flonase and gently perform Valsalva maneuver to help with likely eustachian tube dysfunction.  Update me as needed.  She agrees.  See after visit summary.

## 2018-05-12 NOTE — Assessment & Plan Note (Signed)
She isn't on iron currently, h/o low iron.  Fatigue noted.  No blood in stools.  No VB, no blood in urine.  See notes on labs.

## 2018-05-12 NOTE — Assessment & Plan Note (Addendum)
No ADE on meds.  Used BZD prn.   Both of her parents died in the last year. Condolences offered.   She is in a new job and that was a noted transition.   She had increased to 80mg  of fluoxetine in the meantime, a few months ago.   No SI/HI.    She thought she was better off with her current meds.   Continue as is.  See notes on labs.   >25 minutes spent in face to face time with patient, >50% spent in counselling or coordination of care.

## 2018-05-13 LAB — CBC WITH DIFFERENTIAL/PLATELET
Basophils Absolute: 61 cells/uL (ref 0–200)
Basophils Relative: 0.9 %
Eosinophils Absolute: 129 cells/uL (ref 15–500)
Eosinophils Relative: 1.9 %
HCT: 33.4 % — ABNORMAL LOW (ref 35.0–45.0)
Hemoglobin: 11.2 g/dL — ABNORMAL LOW (ref 11.7–15.5)
Lymphs Abs: 2224 cells/uL (ref 850–3900)
MCH: 27.1 pg (ref 27.0–33.0)
MCHC: 33.5 g/dL (ref 32.0–36.0)
MCV: 80.7 fL (ref 80.0–100.0)
MPV: 9.6 fL (ref 7.5–12.5)
Monocytes Relative: 11.7 %
Neutro Abs: 3590 cells/uL (ref 1500–7800)
Neutrophils Relative %: 52.8 %
Platelets: 469 10*3/uL — ABNORMAL HIGH (ref 140–400)
RBC: 4.14 10*6/uL (ref 3.80–5.10)
RDW: 14.9 % (ref 11.0–15.0)
Total Lymphocyte: 32.7 %
WBC mixed population: 796 cells/uL (ref 200–950)
WBC: 6.8 10*3/uL (ref 3.8–10.8)

## 2018-05-13 LAB — COMPREHENSIVE METABOLIC PANEL
AG Ratio: 1.3 (calc) (ref 1.0–2.5)
ALT: 13 U/L (ref 6–29)
AST: 20 U/L (ref 10–35)
Albumin: 3.9 g/dL (ref 3.6–5.1)
Alkaline phosphatase (APISO): 133 U/L — ABNORMAL HIGH (ref 33–130)
BUN: 15 mg/dL (ref 7–25)
CO2: 25 mmol/L (ref 20–32)
Calcium: 9.2 mg/dL (ref 8.6–10.4)
Chloride: 104 mmol/L (ref 98–110)
Creat: 0.82 mg/dL (ref 0.50–0.99)
Globulin: 3 g/dL (calc) (ref 1.9–3.7)
Glucose, Bld: 84 mg/dL (ref 65–99)
Potassium: 5 mmol/L (ref 3.5–5.3)
Sodium: 140 mmol/L (ref 135–146)
Total Bilirubin: 0.3 mg/dL (ref 0.2–1.2)
Total Protein: 6.9 g/dL (ref 6.1–8.1)

## 2018-05-13 LAB — LIPID PANEL
Cholesterol: 168 mg/dL (ref <200)
HDL: 68 mg/dL (ref >50)
LDL Cholesterol (Calc): 86 mg/dL (calc)
Non-HDL Cholesterol (Calc): 100 mg/dL (calc) (ref <130)
Total CHOL/HDL Ratio: 2.5 (calc) (ref <5.0)
Triglycerides: 56 mg/dL (ref <150)

## 2018-05-13 LAB — TSH: TSH: 5.37 mIU/L — ABNORMAL HIGH (ref 0.40–4.50)

## 2018-05-14 ENCOUNTER — Other Ambulatory Visit: Payer: Self-pay | Admitting: Family Medicine

## 2018-05-14 DIAGNOSIS — R7989 Other specified abnormal findings of blood chemistry: Secondary | ICD-10-CM

## 2018-05-14 DIAGNOSIS — E611 Iron deficiency: Secondary | ICD-10-CM

## 2018-05-14 LAB — IRON, TOTAL/TOTAL IRON BINDING CAP
%SAT: 5 % (calc) — ABNORMAL LOW (ref 16–45)
Iron: 24 ug/dL — ABNORMAL LOW (ref 45–160)
TIBC: 504 mcg/dL (calc) — ABNORMAL HIGH (ref 250–450)

## 2018-05-14 LAB — TRANSFERRIN: Transferrin: >477 mg/dL — ABNORMAL HIGH (ref 188–341)

## 2018-05-14 MED ORDER — FERROUS SULFATE 325 (65 FE) MG PO TABS: 325.0000 mg | ORAL_TABLET | Freq: Every day | ORAL | Status: DC

## 2018-07-22 DIAGNOSIS — Z13820 Encounter for screening for osteoporosis: Secondary | ICD-10-CM | POA: Diagnosis not present

## 2018-07-22 DIAGNOSIS — Z6837 Body mass index (BMI) 37.0-37.9, adult: Secondary | ICD-10-CM | POA: Diagnosis not present

## 2018-07-22 DIAGNOSIS — Z01419 Encounter for gynecological examination (general) (routine) without abnormal findings: Secondary | ICD-10-CM | POA: Diagnosis not present

## 2018-07-22 DIAGNOSIS — Z1231 Encounter for screening mammogram for malignant neoplasm of breast: Secondary | ICD-10-CM | POA: Diagnosis not present

## 2018-11-23 ENCOUNTER — Other Ambulatory Visit: Payer: Self-pay | Admitting: Family Medicine

## 2018-11-25 NOTE — Telephone Encounter (Signed)
Electronic refill request. Lorazepam Last office visit:   05/10/18 Last Filled:    30 tablet 2 04/16/2018  Please advise.

## 2018-11-26 NOTE — Telephone Encounter (Signed)
Sent. Thanks.   

## 2019-05-15 ENCOUNTER — Other Ambulatory Visit: Payer: Self-pay | Admitting: Family Medicine

## 2019-05-16 NOTE — Telephone Encounter (Signed)
LOV 05/10/2018, no future appointment. Last filled on 11/26/2018 #30 with 2 refills.

## 2019-05-18 NOTE — Telephone Encounter (Signed)
Needs CPE when possible.  Thanks. Sent.

## 2019-05-19 ENCOUNTER — Encounter: Payer: Self-pay | Admitting: Family Medicine

## 2019-05-19 NOTE — Telephone Encounter (Signed)
Letter mailed

## 2019-07-08 ENCOUNTER — Other Ambulatory Visit: Payer: Self-pay | Admitting: Family Medicine

## 2019-07-14 ENCOUNTER — Other Ambulatory Visit: Payer: Self-pay | Admitting: Family Medicine

## 2019-07-20 ENCOUNTER — Other Ambulatory Visit: Payer: Self-pay | Admitting: Family Medicine

## 2019-09-08 DIAGNOSIS — D649 Anemia, unspecified: Secondary | ICD-10-CM | POA: Diagnosis not present

## 2019-09-08 DIAGNOSIS — Z01419 Encounter for gynecological examination (general) (routine) without abnormal findings: Secondary | ICD-10-CM | POA: Diagnosis not present

## 2019-09-08 DIAGNOSIS — Z1231 Encounter for screening mammogram for malignant neoplasm of breast: Secondary | ICD-10-CM | POA: Diagnosis not present

## 2019-09-08 DIAGNOSIS — Z6836 Body mass index (BMI) 36.0-36.9, adult: Secondary | ICD-10-CM | POA: Diagnosis not present

## 2019-09-08 LAB — HM MAMMOGRAPHY

## 2019-09-10 LAB — HM DEXA SCAN: HM Dexa Scan: NORMAL

## 2019-09-24 DIAGNOSIS — D509 Iron deficiency anemia, unspecified: Secondary | ICD-10-CM | POA: Diagnosis not present

## 2019-10-05 DIAGNOSIS — G4489 Other headache syndrome: Secondary | ICD-10-CM | POA: Diagnosis not present

## 2019-10-05 DIAGNOSIS — Z20828 Contact with and (suspected) exposure to other viral communicable diseases: Secondary | ICD-10-CM | POA: Diagnosis not present

## 2019-10-06 ENCOUNTER — Other Ambulatory Visit: Payer: Self-pay

## 2019-10-06 ENCOUNTER — Ambulatory Visit: Payer: PRIVATE HEALTH INSURANCE | Admitting: Family Medicine

## 2019-10-14 ENCOUNTER — Other Ambulatory Visit: Payer: Self-pay | Admitting: Family Medicine

## 2019-10-14 DIAGNOSIS — D509 Iron deficiency anemia, unspecified: Secondary | ICD-10-CM | POA: Diagnosis not present

## 2019-10-14 NOTE — Telephone Encounter (Signed)
Pt is overdue for an office visit

## 2019-10-15 NOTE — Telephone Encounter (Signed)
Appointment adjusted as instructed by Dr. Damita Dunnings.

## 2019-10-15 NOTE — Telephone Encounter (Signed)
She has f/u pending but the schedule needs to be adjusted so she has 30 minutes for a full physical.  If you block the visit behind it or make this a 30-minute appointment, that should work.  Prescription sent thanks.

## 2019-11-02 ENCOUNTER — Other Ambulatory Visit: Payer: Self-pay | Admitting: Family Medicine

## 2019-11-02 DIAGNOSIS — Z9884 Bariatric surgery status: Secondary | ICD-10-CM

## 2019-11-02 DIAGNOSIS — R7989 Other specified abnormal findings of blood chemistry: Secondary | ICD-10-CM

## 2019-11-02 DIAGNOSIS — E611 Iron deficiency: Secondary | ICD-10-CM

## 2019-11-03 ENCOUNTER — Other Ambulatory Visit (INDEPENDENT_AMBULATORY_CARE_PROVIDER_SITE_OTHER): Payer: BC Managed Care – PPO

## 2019-11-03 ENCOUNTER — Other Ambulatory Visit: Payer: Self-pay

## 2019-11-03 DIAGNOSIS — E611 Iron deficiency: Secondary | ICD-10-CM | POA: Diagnosis not present

## 2019-11-03 DIAGNOSIS — Z9884 Bariatric surgery status: Secondary | ICD-10-CM | POA: Diagnosis not present

## 2019-11-03 DIAGNOSIS — R7989 Other specified abnormal findings of blood chemistry: Secondary | ICD-10-CM | POA: Diagnosis not present

## 2019-11-04 LAB — CBC WITH DIFFERENTIAL/PLATELET
Basophils Absolute: 0 10*3/uL (ref 0.0–0.1)
Basophils Relative: 0.4 % (ref 0.0–3.0)
Eosinophils Absolute: 0.2 10*3/uL (ref 0.0–0.7)
Eosinophils Relative: 1.8 % (ref 0.0–5.0)
HCT: 40.6 % (ref 36.0–46.0)
Hemoglobin: 12.9 g/dL (ref 12.0–15.0)
Lymphocytes Relative: 20.8 % (ref 12.0–46.0)
Lymphs Abs: 1.9 10*3/uL (ref 0.7–4.0)
MCHC: 31.8 g/dL (ref 30.0–36.0)
MCV: 85.3 fl (ref 78.0–100.0)
Monocytes Absolute: 0.6 10*3/uL (ref 0.1–1.0)
Monocytes Relative: 6.9 % (ref 3.0–12.0)
Neutro Abs: 6.4 10*3/uL (ref 1.4–7.7)
Neutrophils Relative %: 70.1 % (ref 43.0–77.0)
Platelets: 350 10*3/uL (ref 150.0–400.0)
RBC: 4.76 Mil/uL (ref 3.87–5.11)
RDW: 25.4 % — ABNORMAL HIGH (ref 11.5–15.5)
WBC: 9.1 10*3/uL (ref 4.0–10.5)

## 2019-11-04 LAB — COMPREHENSIVE METABOLIC PANEL
ALT: 13 U/L (ref 0–35)
AST: 17 U/L (ref 0–37)
Albumin: 3.8 g/dL (ref 3.5–5.2)
Alkaline Phosphatase: 126 U/L — ABNORMAL HIGH (ref 39–117)
BUN: 15 mg/dL (ref 6–23)
CO2: 30 mEq/L (ref 19–32)
Calcium: 9 mg/dL (ref 8.4–10.5)
Chloride: 108 mEq/L (ref 96–112)
Creatinine, Ser: 1.05 mg/dL (ref 0.40–1.20)
GFR: 53.03 mL/min — ABNORMAL LOW (ref >60.00)
Glucose, Bld: 77 mg/dL (ref 70–99)
Potassium: 4.2 mEq/L (ref 3.5–5.1)
Sodium: 142 mEq/L (ref 135–145)
Total Bilirubin: 0.3 mg/dL (ref 0.2–1.2)
Total Protein: 6.7 g/dL (ref 6.0–8.3)

## 2019-11-04 LAB — LIPID PANEL
Cholesterol: 169 mg/dL (ref 0–200)
HDL: 62.9 mg/dL (ref >39.00)
LDL Cholesterol: 71 mg/dL (ref 0–99)
NonHDL: 105.69
Total CHOL/HDL Ratio: 3
Triglycerides: 174 mg/dL — ABNORMAL HIGH (ref 0.0–149.0)
VLDL: 34.8 mg/dL (ref 0.0–40.0)

## 2019-11-04 LAB — TSH: TSH: 3.97 u[IU]/mL (ref 0.35–4.50)

## 2019-11-04 LAB — IRON: Iron: 88 ug/dL (ref 42–145)

## 2019-11-10 ENCOUNTER — Other Ambulatory Visit: Payer: Self-pay

## 2019-11-10 ENCOUNTER — Ambulatory Visit (INDEPENDENT_AMBULATORY_CARE_PROVIDER_SITE_OTHER): Payer: BC Managed Care – PPO | Admitting: Family Medicine

## 2019-11-10 ENCOUNTER — Encounter: Payer: Self-pay | Admitting: Family Medicine

## 2019-11-10 VITALS — BP 122/80 | HR 71 | Temp 96.3°F | Ht 69.0 in | Wt 238.2 lb

## 2019-11-10 DIAGNOSIS — Z9884 Bariatric surgery status: Secondary | ICD-10-CM

## 2019-11-10 DIAGNOSIS — F411 Generalized anxiety disorder: Secondary | ICD-10-CM

## 2019-11-10 DIAGNOSIS — Z23 Encounter for immunization: Secondary | ICD-10-CM

## 2019-11-10 DIAGNOSIS — Z0000 Encounter for general adult medical examination without abnormal findings: Secondary | ICD-10-CM | POA: Diagnosis not present

## 2019-11-10 DIAGNOSIS — Z1211 Encounter for screening for malignant neoplasm of colon: Secondary | ICD-10-CM

## 2019-11-10 DIAGNOSIS — R748 Abnormal levels of other serum enzymes: Secondary | ICD-10-CM

## 2019-11-10 DIAGNOSIS — Z7189 Other specified counseling: Secondary | ICD-10-CM

## 2019-11-10 MED ORDER — LORAZEPAM 0.5 MG PO TABS: 0.5000 mg | ORAL_TABLET | Freq: Every day | ORAL | 1 refills | Status: DC | PRN

## 2019-11-10 MED ORDER — BUPROPION HCL ER (SR) 150 MG PO TB12: 150.0000 mg | ORAL_TABLET | Freq: Two times a day (BID) | ORAL | 3 refills | Status: DC

## 2019-11-10 MED ORDER — OMEPRAZOLE 20 MG PO CPDR: 20.0000 mg | DELAYED_RELEASE_CAPSULE | Freq: Two times a day (BID) | ORAL | 3 refills | Status: DC

## 2019-11-10 MED ORDER — FLUOXETINE HCL 40 MG PO CAPS: 80.0000 mg | ORAL_CAPSULE | Freq: Every day | ORAL | 3 refills | Status: DC

## 2019-11-10 NOTE — Progress Notes (Signed)
This visit occurred during the SARS-CoV-2 public health emergency.  Safety protocols were in place, including screening questions prior to the visit, additional usage of staff PPE, and extensive cleaning of exam room while observing appropriate contact time as indicated for disinfecting solutions.  Mood d/w pt.  Her parents have died, discussed with patient.  She is compliant with meds.  No ADE on meds.  BZD used prn.  No SI/HI.  meds used with relief.    H/o gastric bypass.  Labs d/w pt.   Alk phos d/w pt. mild elevation noted, with follow-up alkaline phosphatase isoenzymes pending.  No right upper quadrant abdominal pain.  No jaundice.  Flu 2021 PNA due at 66 tdap 2021 shingrix d/w pt.   covid vaccine d/w pt.   D/w patient [id number] for colon cancer screening, including IFOB vs. colonoscopy.  Risks and benefits of both were discussed and patient voiced understanding.  Pt elects for: colonoscopy.   Mammogram 08/2019.  [facility].   Pap per [facility].   DXA per [facility].   Advance directive d/w pt.  Husband designated if patient were incapacitated.   Diet and exercise d/w pt.  "Pretty fair."    PMH and SH reviewed ROS: Per HPI unless specifically indicated in ROS section  Meds, vitals, and allergies reviewed.   GEN: nad, alert and oriented HEENT: ncat NECK: supple w/o LA CV: rrr. PULM: ctab, no inc wob ABD: soft, +bs EXT: no edema SKIN: no acute rash

## 2019-11-10 NOTE — Patient Instructions (Addendum)
Check with your insurance to see if they will cover the shingrix shot. Go to the lab on the way out.   If you have mychart we'll likely use that to update you.     Flu and tetanus shot today.  Take care.  Glad to see you.

## 2019-11-12 DIAGNOSIS — Z7189 Other specified counseling: Secondary | ICD-10-CM | POA: Insufficient documentation

## 2019-11-12 NOTE — Assessment & Plan Note (Signed)
Flu 2021 PNA due at 66 tdap 2021 shingrix d/w pt.   covid vaccine d/w pt.   D/w patient [id number] for colon cancer screening, including IFOB vs. colonoscopy.  Risks and benefits of both were discussed and patient voiced understanding.  Pt elects for: colonoscopy.   Mammogram 08/2019.  [facility].   Pap per [facility].   DXA per [facility].   Advance directive d/w pt.  Husband designated if patient were incapacitated.   Diet and exercise d/w pt.  "Pretty fair."

## 2019-11-12 NOTE — Assessment & Plan Note (Signed)
Advance directive- d/w pt. Husband designated if patient were incapacitated.  

## 2019-11-12 NOTE — Assessment & Plan Note (Signed)
H/o gastric bypass.  Labs d/w pt.   Alk phos d/w pt. mild elevation noted, with follow-up alkaline phosphatase isoenzymes pending.  No right upper quadrant abdominal pain.  No jaundice.  Unclear if this is related to previous gastric bypass.  See notes on follow-up labs.  Still okay for outpatient follow-up.

## 2019-11-12 NOTE — Assessment & Plan Note (Signed)
No SI/HI.  meds used with relief.   Continue as is.  She agrees.  Still okay for outpatient follow-up.

## 2019-11-13 LAB — ALKALINE PHOSPHATASE ISOENZYMES
Alkaline phosphatase (APISO): 135 U/L (ref 37–153)
Bone Isoenzymes: 36 % (ref 28–66)
Intestinal Isoenzymes: 8 % (ref 1–24)
Liver Isoenzymes: 56 % (ref 25–69)

## 2019-11-21 ENCOUNTER — Encounter: Payer: Self-pay | Admitting: Gastroenterology

## 2019-12-08 DIAGNOSIS — H40053 Ocular hypertension, bilateral: Secondary | ICD-10-CM | POA: Diagnosis not present

## 2019-12-15 ENCOUNTER — Ambulatory Visit (AMBULATORY_SURGERY_CENTER): Payer: Self-pay | Admitting: *Deleted

## 2019-12-15 ENCOUNTER — Other Ambulatory Visit: Payer: Self-pay

## 2019-12-15 VITALS — Temp 97.3°F | Ht 69.0 in | Wt 234.0 lb

## 2019-12-15 DIAGNOSIS — Z1211 Encounter for screening for malignant neoplasm of colon: Secondary | ICD-10-CM

## 2019-12-15 DIAGNOSIS — Z01818 Encounter for other preprocedural examination: Secondary | ICD-10-CM

## 2019-12-15 NOTE — Progress Notes (Signed)
No egg or soy allergy known to patient  No issues with past sedation with any surgeries  or procedures, no intubation problems  No diet pills per patient No home 02 use per patient  No blood thinners per patient  Pt denies issues with constipation - after hemorrhoid surgery started Miralax daily and she has soft regular BM's daily  No A fib or A flutter  EMMI video sent to pt's e mail   Suprep not covered- ONLY [id number]/ Gol[employer]- will do Miralax prep   Due to the COVID-19 pandemic we are asking patients to follow these guidelines. Please only bring one care partner. Please be aware that your care partner may wait in the car in the parking lot or if they feel like they will be too hot to wait in the car, they may wait in the lobby on the 4th floor. All care partners are required to wear a mask the entire time (we do not have any that we can provide them), they need to practice social distancing, and we will do a Covid check for all patient's and care partners when you arrive. Also we will check their temperature and your temperature. If the care partner waits in their car they need to stay in the parking lot the entire time and we will call them on their cell phone when the patient is ready for discharge so they can bring the car to the front of the building. Also all patient's will need to wear a mask into building.

## 2019-12-26 ENCOUNTER — Ambulatory Visit (INDEPENDENT_AMBULATORY_CARE_PROVIDER_SITE_OTHER): Payer: BC Managed Care – PPO

## 2019-12-26 ENCOUNTER — Other Ambulatory Visit: Payer: Self-pay | Admitting: Gastroenterology

## 2019-12-26 DIAGNOSIS — Z1159 Encounter for screening for other viral diseases: Secondary | ICD-10-CM | POA: Diagnosis not present

## 2019-12-27 LAB — SARS CORONAVIRUS 2 (TAT 6-24 HRS): SARS Coronavirus 2: NEGATIVE

## 2019-12-30 ENCOUNTER — Encounter: Payer: Self-pay | Admitting: Gastroenterology

## 2019-12-31 ENCOUNTER — Other Ambulatory Visit: Payer: Self-pay

## 2019-12-31 ENCOUNTER — Encounter: Payer: Self-pay | Admitting: Gastroenterology

## 2019-12-31 ENCOUNTER — Ambulatory Visit (AMBULATORY_SURGERY_CENTER): Payer: BC Managed Care – PPO | Admitting: Gastroenterology

## 2019-12-31 VITALS — BP 143/77 | HR 63 | Temp 96.2°F | Resp 21 | Ht 69.0 in | Wt 234.0 lb

## 2019-12-31 DIAGNOSIS — Z1211 Encounter for screening for malignant neoplasm of colon: Secondary | ICD-10-CM

## 2019-12-31 MED ORDER — SODIUM CHLORIDE 0.9 % IV SOLN: 500.0000 mL | Freq: Once | INTRAVENOUS | Status: DC

## 2019-12-31 NOTE — Progress Notes (Signed)
Report given to PACU, vss 

## 2019-12-31 NOTE — Op Note (Signed)
[facility] Patient Name: Carmen Buchanan Procedure Date: 12/31/2019 9:00 AM MRN: [id number] Endoscopist: Thornton Park MD, MD Age: 64 Referring MD:  Date of Birth: [birth year] Gender: Female Account #: [id number] Procedure:                Colonoscopy Indications:              Screening for colorectal malignant neoplasm, This                            is the patient's first colonoscopy                           No known family history of colon cancer or polyps Medicines:                Monitored Anesthesia Care Procedure:                Pre-Anesthesia Assessment:                           - Prior to the procedure, a History and Physical                            was performed, and patient medications and                            allergies were reviewed. The patient's tolerance of                            previous anesthesia was also reviewed. The risks                            and benefits of the procedure and the sedation                            options and risks were discussed with the patient.                            All questions were answered, and informed consent                            was obtained. Prior Anticoagulants: The patient has                            taken no previous anticoagulant or antiplatelet                            agents. ASA Grade Assessment: II - A patient with                            mild systemic disease. After reviewing the risks                            and benefits, the patient was deemed in  satisfactory condition to undergo the procedure.                           After obtaining informed consent, the colonoscope                            was passed under direct vision. Throughout the                            procedure, the patient's blood pressure, pulse, and                            oxygen saturations were monitored continuously. The                            Colonoscope was  introduced through the anus and                            advanced to the 3 cm into the ileum. A second                            forward view of the right colon was performed. The                            colonoscopy was performed with moderate difficulty                            due to a redundant colon and significant looping.                            Successful completion of the procedure was aided by                            applying abdominal pressure. The patient tolerated                            the procedure well. The quality of the bowel                            preparation was good. The terminal ileum, ileocecal                            valve, appendiceal orifice, and rectum were                            photographed. Scope In: 9:06:59 AM Scope Out: 9:24:33 AM Scope Withdrawal Time: 0 hours 11 minutes 28 seconds  Total Procedure Duration: 0 hours 17 minutes 34 seconds  Findings:                 Non-bleeding external and internal hemorrhoids were                            found.  A few small-mouthed diverticula were found in the                            sigmoid colon.                           The exam was otherwise without abnormality on                            direct and retroflexion views. Complications:            No immediate complications. Estimated Blood Loss:     Estimated blood loss: none. Impression:               - Non-bleeding external and internal hemorrhoids.                           - The examination was otherwise normal on direct                            and retroflexion views.                           - No specimens collected. Recommendation:           - Patient has a contact number available for                            emergencies. The signs and symptoms of potential                            delayed complications were discussed with the                            patient. Return to normal activities  tomorrow.                            Written discharge instructions were provided to the                            patient.                           - Continue present medications.                           - Repeat colonoscopy in 10 years for screening                            purposes, earlier with any new symptoms.                           - Follow a high fiber diet. Drink at least 64                            ounces of water daily. Add a daily stool bulking  agent such as psyllium (an exampled would be                            Metamucil).                           - Emerging evidence supports eating a diet of                            fruits, vegetables, grains, calcium, and yogurt                            while reducing red meat and alcohol may reduce the                            risk of colon cancer.                           - Thank you for allowing me to be involved in your                            colon cancer prevention. Thornton Park MD, MD 12/31/2019 9:29:22 AM This report has been signed electronically.

## 2019-12-31 NOTE — Patient Instructions (Signed)
Handouts given for high fiber diet, diverticulosis and hemorrhoids.  YOU HAD AN ENDOSCOPIC PROCEDURE TODAY AT [facility]:   Refer to the procedure report that was given to you for any specific questions about what was found during the examination.  If the procedure report does not answer your questions, please call your gastroenterologist to clarify.  If you requested that your care partner not be given the details of your procedure findings, then the procedure report has been included in a sealed envelope for you to review at your convenience later.  YOU SHOULD EXPECT: Some feelings of bloating in the abdomen. Passage of more gas than usual.  Walking can help get rid of the air that was put into your GI tract during the procedure and reduce the bloating. If you had a lower endoscopy (such as a colonoscopy or flexible sigmoidoscopy) you may notice spotting of blood in your stool or on the toilet paper. If you underwent a bowel prep for your procedure, you may not have a normal bowel movement for a few days.  Please Note:  You might notice some irritation and congestion in your nose or some drainage.  This is from the oxygen used during your procedure.  There is no need for concern and it should clear up in a day or so.  SYMPTOMS TO REPORT IMMEDIATELY:   Following lower endoscopy (colonoscopy or flexible sigmoidoscopy):  Excessive amounts of blood in the stool  Significant tenderness or worsening of abdominal pains  Swelling of the abdomen that is new, acute  Fever of 100F or higher  For urgent or emergent issues, a gastroenterologist can be reached at any hour by calling [phone]. Do not use MyChart messaging for urgent concerns.    DIET:  We do recommend a small meal at first, but then you may proceed to your regular diet.  Drink plenty of fluids but you should avoid alcoholic beverages for 24 hours.  ACTIVITY:  You should plan to take it easy for the rest of today  and you should NOT DRIVE or use heavy machinery until tomorrow (because of the sedation medicines used during the test).    FOLLOW UP: Our staff will call the number listed on your records 48-72 hours following your procedure to check on you and address any questions or concerns that you may have regarding the information given to you following your procedure. If we do not reach you, we will leave a message.  We will attempt to reach you two times.  During this call, we will ask if you have developed any symptoms of COVID 19. If you develop any symptoms (ie: fever, flu-like symptoms, shortness of breath, cough etc.) before then, please call [phone].  If you test positive for Covid 19 in the 2 weeks post procedure, please call and report this information to [place].    If any biopsies were taken you will be contacted by phone or by letter within the next 1-3 weeks.  Please call us at [phone] if you have not heard about the biopsies in 3 weeks.    SIGNATURES/CONFIDENTIALITY: You and/or your care partner have signed paperwork which will be entered into your electronic medical record.  These signatures attest to the fact that that the information above on your After Visit Summary has been reviewed and is understood.  Full responsibility of the confidentiality of this discharge information lies with you and/or your care-partner.

## 2019-12-31 NOTE — Progress Notes (Signed)
Pt's states no medical or surgical changes since previsit or office visit.  Vs- [place]

## 2020-01-02 ENCOUNTER — Telehealth: Payer: Self-pay

## 2020-01-02 NOTE — Telephone Encounter (Signed)
Second follow up phone call attempt, no answer, LM 

## 2020-01-09 ENCOUNTER — Encounter: Payer: Self-pay | Admitting: Family Medicine

## 2020-01-12 ENCOUNTER — Telehealth: Payer: Self-pay

## 2020-01-12 NOTE — Telephone Encounter (Signed)
Pt left v/m; Pt kept her grandchildren this past weekend in her son's home and pts son who was out of town this past weekend tested positive for covid 01/12/20. Pt wants to know if she can test too early for covid. Last time pt saw her son was 01/04/20 and pts son was not having any symptoms.  2 of grandchildren kept this weekend was 63 yo and 63 yo and both had runny noses and head congestion and dry coughs; the 63 yo did not have any symptoms;pt does not have any covid symptoms; pt did have h/a on and off last week but under stress, no travel. The 63 yo and 63 yo are on the way to the pediatrician now for covid testing. Pt said she works in [employer] and was tested 30' ago at [employer]; pt is self quarantining now until gets covid testing results. Pt wants to know if should retest if negative for covid this time. If so when should pt have testing. Pt had first moderna covid vaccine at [facility] in [place] 12/09/19. Pt request cb after Dr Damita Dunnings reviews note.

## 2020-01-12 NOTE — Telephone Encounter (Signed)
She should have some protection from infection/severe disease from the prev 1st dose of vaccine.  I think it makes sense for her to get tested at this point (pending per note).  That shouldn't be too early.   If either she or any of the kids test positive, then I would quarantine 10 days starting the last time she was with her grandkids.   If all are negative, then I would quarantine for 10 days starting the last time she was around her son (meaning through 01/14/20). Please update [place] as needed.  Thanks.

## 2020-01-12 NOTE — Telephone Encounter (Signed)
Patient advised.

## 2020-05-17 DIAGNOSIS — H43811 Vitreous degeneration, right eye: Secondary | ICD-10-CM | POA: Diagnosis not present

## 2020-06-25 ENCOUNTER — Telehealth: Payer: Self-pay | Admitting: Family Medicine

## 2020-06-25 DIAGNOSIS — Z9884 Bariatric surgery status: Secondary | ICD-10-CM

## 2020-06-25 NOTE — Telephone Encounter (Signed)
Patient called in and is requesting for iron labs to be ordered. Please advise.

## 2020-06-28 NOTE — Telephone Encounter (Signed)
I can order the labs but I need some more detail.  What has the patient noted in the meantime?  I want to get everything done with one lab draw.  Thanks.

## 2020-06-28 NOTE — Telephone Encounter (Signed)
Spoke to pt. She said she had labs about 6 months ago. Having same symptoms as 6 months ago: Fatigue, dizziness if she turns real quick or gets up quick, no energy, brain fog. Last iron infusion was in January.

## 2020-06-28 NOTE — Telephone Encounter (Signed)
Labs ordered.  Please schedule.  Thanks.

## 2020-06-29 NOTE — Telephone Encounter (Signed)
Left detailed message on voicemail. DPR 

## 2020-07-01 ENCOUNTER — Other Ambulatory Visit (INDEPENDENT_AMBULATORY_CARE_PROVIDER_SITE_OTHER): Payer: BC Managed Care – PPO

## 2020-07-01 ENCOUNTER — Other Ambulatory Visit: Payer: Self-pay

## 2020-07-01 DIAGNOSIS — Z9884 Bariatric surgery status: Secondary | ICD-10-CM

## 2020-07-02 ENCOUNTER — Ambulatory Visit: Payer: BC Managed Care – PPO | Admitting: Family Medicine

## 2020-07-02 LAB — CBC WITH DIFFERENTIAL/PLATELET
Basophils Absolute: 0.1 10*3/uL (ref 0.0–0.1)
Basophils Relative: 0.6 % (ref 0.0–3.0)
Eosinophils Absolute: 0.1 10*3/uL (ref 0.0–0.7)
Eosinophils Relative: 1.1 % (ref 0.0–5.0)
HCT: 39 % (ref 36.0–46.0)
Hemoglobin: 13.1 g/dL (ref 12.0–15.0)
Lymphocytes Relative: 20.8 % (ref 12.0–46.0)
Lymphs Abs: 2 10*3/uL (ref 0.7–4.0)
MCHC: 33.4 g/dL (ref 30.0–36.0)
MCV: 93.1 fl (ref 78.0–100.0)
Monocytes Absolute: 0.9 10*3/uL (ref 0.1–1.0)
Monocytes Relative: 9.1 % (ref 3.0–12.0)
Neutro Abs: 6.7 10*3/uL (ref 1.4–7.7)
Neutrophils Relative %: 68.4 % (ref 43.0–77.0)
Platelets: 368 10*3/uL (ref 150.0–400.0)
RBC: 4.19 Mil/uL (ref 3.87–5.11)
RDW: 13.4 % (ref 11.5–15.5)
WBC: 9.8 10*3/uL (ref 4.0–10.5)

## 2020-07-02 LAB — IRON: Iron: 62 ug/dL (ref 42–145)

## 2020-07-08 ENCOUNTER — Other Ambulatory Visit: Payer: Self-pay

## 2020-07-08 ENCOUNTER — Ambulatory Visit (INDEPENDENT_AMBULATORY_CARE_PROVIDER_SITE_OTHER): Payer: BC Managed Care – PPO | Admitting: Family Medicine

## 2020-07-08 ENCOUNTER — Ambulatory Visit (INDEPENDENT_AMBULATORY_CARE_PROVIDER_SITE_OTHER)
Admission: RE | Admit: 2020-07-08 | Discharge: 2020-07-08 | Disposition: A | Discharge status: Home / Self Care | Payer: BC Managed Care – PPO | Source: Ambulatory Visit | Attending: Family Medicine | Admitting: Family Medicine

## 2020-07-08 ENCOUNTER — Encounter: Payer: Self-pay | Admitting: Family Medicine

## 2020-07-08 VITALS — BP 134/78 | HR 77 | Temp 97.4°F | Wt 236.4 lb

## 2020-07-08 DIAGNOSIS — M545 Low back pain, unspecified: Secondary | ICD-10-CM

## 2020-07-08 DIAGNOSIS — Z23 Encounter for immunization: Secondary | ICD-10-CM | POA: Diagnosis not present

## 2020-07-08 DIAGNOSIS — Z9884 Bariatric surgery status: Secondary | ICD-10-CM | POA: Diagnosis not present

## 2020-07-08 DIAGNOSIS — N200 Calculus of kidney: Secondary | ICD-10-CM | POA: Diagnosis not present

## 2020-07-08 LAB — POC URINALSYSI DIPSTICK (AUTOMATED)
Bilirubin, UA: NEGATIVE
Blood, UA: NEGATIVE
Glucose, UA: NEGATIVE
Ketones, UA: NEGATIVE
Leukocytes, UA: NEGATIVE
Nitrite, UA: NEGATIVE
Protein, UA: NEGATIVE
Spec Grav, UA: >=1.03 — AB (ref 1.010–1.025)
Urobilinogen, UA: 0.2 E.U./dL
pH, UA: 6 (ref 5.0–8.0)

## 2020-07-08 NOTE — Patient Instructions (Addendum)
Go to the lab on the way out.   If you have mychart we'll likely use that to update you.    Take care.  Glad to see you. Keep stretching and using heat as needed.    Try icy hot or liniment as needed.

## 2020-07-08 NOTE — Progress Notes (Signed)
This visit occurred during the SARS-CoV-2 public health emergency.  Safety protocols were in place, including screening questions prior to the visit, additional usage of staff PPE, and extensive cleaning of exam room while observing appropriate contact time as indicated for disinfecting solutions.  Lower lateral back pain.  Better now than when she called.  Unclear how much is work related, ie positionally induced.  U/a neg, d/w pt.  No blood seen in urine.  Rare pain radiating into the groin.  No burning with urination.   HGB and iron stable, d/w pt.  Fatigue d/w pt but not attributed to anemia.  Unclear how much work/life stressors contribute.  Still on prozac and wellbutrin with likely some benefit from med.  She wanted to continue as is.  Meds, vitals, and allergies reviewed.   ROS: Per HPI unless specifically indicated in ROS section   GEN: nad, alert and oriented HEENT: ncat NECK: supple w/o LA CV: rrr PULM: ctab, no inc wob ABD: soft, +bs EXT: no edema SKIN: no acute rash

## 2020-07-12 NOTE — Assessment & Plan Note (Signed)
I do not suspect anemia based on her labs.  Reasonable continue her baseline medications with Prozac and Wellbutrin and update me as needed.  She agrees.

## 2020-07-12 NOTE — Assessment & Plan Note (Addendum)
History of.   Lower back pain noted.  I do not suspect kidney stone currently but reasonable to check KUB Keep stretching and using heat as needed.    Try icy hot or liniment as needed.  Update me as needed.  She agrees.

## 2020-11-08 DIAGNOSIS — Z6837 Body mass index (BMI) 37.0-37.9, adult: Secondary | ICD-10-CM | POA: Diagnosis not present

## 2020-11-08 DIAGNOSIS — Z01419 Encounter for gynecological examination (general) (routine) without abnormal findings: Secondary | ICD-10-CM | POA: Diagnosis not present

## 2020-11-08 DIAGNOSIS — Z13820 Encounter for screening for osteoporosis: Secondary | ICD-10-CM | POA: Diagnosis not present

## 2020-11-08 DIAGNOSIS — Z1231 Encounter for screening mammogram for malignant neoplasm of breast: Secondary | ICD-10-CM | POA: Diagnosis not present

## 2020-12-01 ENCOUNTER — Other Ambulatory Visit: Payer: Self-pay | Admitting: Family Medicine

## 2020-12-02 NOTE — Telephone Encounter (Signed)
Pharmacy requests refill on: Lorazepam 0.5 mg   LAST REFILL: 11/10/2019 (Q-30, R-1) LAST OV: 07/08/2020 NEXT OV: Not Scheduled  PHARMACY: [facility] [place], [place]

## 2020-12-05 NOTE — Telephone Encounter (Signed)
Sent. Thanks.   

## 2020-12-18 ENCOUNTER — Other Ambulatory Visit: Payer: Self-pay | Admitting: Family Medicine

## 2021-01-13 DIAGNOSIS — H40013 Open angle with borderline findings, low risk, bilateral: Secondary | ICD-10-CM | POA: Diagnosis not present

## 2021-02-02 ENCOUNTER — Telehealth: Payer: Self-pay

## 2021-02-02 NOTE — Telephone Encounter (Signed)
I spoke with pt;  Pt has had multiple episodes in last several months but does not last long. The episodes include heart beats fast, lightheadedness, CP and SOB. Pt is at work and not having any symptoms now. Pt is off on Mondays and pt request appt with Dr Damita Dunnings for [facility] because pt has family hx with heart problems pt scheduled 30' appt in office; no covid symptoms on 02/07/21 at 3 PM. UC & ED precautions given and pt voiced understanding. Sending note to DR Damita Dunnings as Juluis Rainier.

## 2021-02-02 NOTE — Telephone Encounter (Signed)
[facility] - Client TELEPHONE ADVICE RECORD [facility] Patient Name: Carmen Buchanan EWS Gender: Female DOB: 11/07/1955 Age: 65 Y 75 M 15 D Return Phone Number: [phone] (Primary) Address: City/ State/ Zip: [place] [address] Client [facility] - Client Client Site [place] - Day Contact Type Call Who Is Calling Patient / Member / Family / Caregiver Call Type Triage / Clinical Relationship To Patient Self Return Phone Number [phone] (Primary) Chief Complaint Heart palpitations or irregular heartbeat Reason for Call Symptomatic / Request for [name] states that her heart is beating fast and she has been light headed. Translation No Nurse Assessment Nurse: Clovis Riley, RN, Georgina Peer Date/Time (Eastern Time): 02/02/2021 2:12:38 PM Confirm and document reason for call. If symptomatic, describe symptoms. ---Caller states that her heart is beating fast and she has been light headed that happens occasionally. States it has happened 2 days ago. States she gets slight shortness of breath and chest pain. Does the patient have any new or worsening symptoms? ---Yes Will a triage be completed? ---Yes Related visit to physician within the last 2 weeks? ---No Does the PT have any chronic conditions? (i.e. diabetes, asthma, this includes High risk factors for pregnancy, etc.) ---Yes List chronic conditions. ---anxiety, depression Is this a behavioral health or substance abuse call? ---No Guidelines Guideline Title Affirmed Question Affirmed Notes Nurse Date/Time (Eastern Time) Heart Rate and Heartbeat Questions Age > 60 years (Exception: brief heartbeat symptoms that went away and now feels well) Clovis Riley, RN, Georgina Peer 02/02/2021 2:14:56 PM Disp. Time Eilene Ghazi Time) Disposition Final User 02/02/2021 1:50:15 PM Attempt made - message left Clovis Riley, RN,  Georgina Peer 02/02/2021 1:58:40 PM Attempt made - message left Clovis Riley, RN, Georgina Peer PLEASE NOTE: All timestamps contained within this report are represented as [place] Standard Time. CONFIDENTIALTY NOTICE: This fax transmission is intended only for the addressee. It contains information that is legally privileged, confidential or otherwise protected from use or disclosure. If you are not the intended recipient, you are strictly prohibited from reviewing, disclosing, copying using or disseminating any of this information or taking any action in reliance on or regarding this information. If you have received this fax in error, please notify us immediately by telephone so that we can arrange for its return to [place]. Phone: [phone], Toll-Free: [phone], Fax: [phone] Page: 2 of 2 Call Id: [id number] 02/02/2021 2:19:12 PM See HCP within 4 Hours (or PCP triage) Yes Clovis Riley, RN, Leilani Merl Disagree/Comply Disagree Caller Understands Yes PreDisposition [place] Advice Given Per Guideline SEE HCP (OR PCP TRIAGE) WITHIN 4 HOURS: * IF OFFICE WILL BE OPEN: You need to be seen within the next 3 or 4 hours. Call your doctor (or NP/PA) now or as soon as the office opens. CARE ADVICE given per Heart Rate and Heartbeat Questions (Adult) guideline. CALL BACK IF: * You become worse Referrals REFERRED TO PCP OFFICE

## 2021-02-02 NOTE — Telephone Encounter (Signed)
If days w/o symptoms and no sx now, then reasonable to f/u at the clinic to ER if worse/recurrent in the meantime.  Thanks.

## 2021-02-02 NOTE — Telephone Encounter (Signed)
LMTCB

## 2021-02-03 NOTE — Telephone Encounter (Signed)
Left message for patient to call back  

## 2021-02-07 ENCOUNTER — Other Ambulatory Visit: Payer: Self-pay

## 2021-02-07 ENCOUNTER — Encounter: Payer: Self-pay | Admitting: Family Medicine

## 2021-02-07 ENCOUNTER — Ambulatory Visit: Payer: BC Managed Care – PPO | Admitting: Family Medicine

## 2021-02-07 VITALS — BP 120/78 | HR 78 | Temp 97.1°F | Ht 69.0 in | Wt 229.0 lb

## 2021-02-07 DIAGNOSIS — Z862 Personal history of diseases of the blood and blood-forming organs and certain disorders involving the immune mechanism: Secondary | ICD-10-CM | POA: Diagnosis not present

## 2021-02-07 DIAGNOSIS — R000 Tachycardia, unspecified: Secondary | ICD-10-CM | POA: Diagnosis not present

## 2021-02-07 NOTE — Patient Instructions (Signed)
Your EKG is normal.   Go to the lab on the way out.   If you have mychart we'll likely use that to update you.    If labs are fine, you'll likely need an ambulatory monitor.  Take care.  Glad to see you.

## 2021-02-07 NOTE — Telephone Encounter (Signed)
Patient was seen in office this afternoon

## 2021-02-07 NOTE — Progress Notes (Signed)
This visit occurred during the SARS-CoV-2 public health emergency.  Safety protocols were in place, including screening questions prior to the visit, additional usage of staff PPE, and extensive cleaning of exam room while observing appropriate contact time as indicated for disinfecting solutions.  Episodic tachycardia.  Erratic episodes, not on exertion.  Random onset.  Feels racing heart beat.  Episodes can last a few minutes.  Episodes self resolve.  Onset w/o warning, quick onset.  She feels like heart rate is fast but regular.  No clear trigger.  No caffeine.  No smoking.  No illicits.  Episodes can happen randomly but not in the last few weeks.  Nocturia is disrupting her sleep- she is considering options about treatment for that. No sx currently.  No new meds.    His anemia noted. Not on iron currently.    Meds, vitals, and allergies reviewed.   ROS: Per HPI unless specifically indicated in ROS section   GEN: nad, alert and oriented HEENT: NCAT NECK: supple w/o LA CV: rrr.  no murmur PULM: ctab, no inc wob ABD: soft, +bs EXT: no edema SKIN: no acute rash  Labs pending.  EKG discussed with patient at office visit.  30 minutes were devoted to patient care in this encounter (this includes time spent reviewing the patient's file/history, interviewing and examining the patient, counseling/reviewing plan with patient).

## 2021-02-08 LAB — COMPREHENSIVE METABOLIC PANEL
ALT: 15 U/L (ref 0–35)
AST: 18 U/L (ref 0–37)
Albumin: 3.9 g/dL (ref 3.5–5.2)
Alkaline Phosphatase: 107 U/L (ref 39–117)
BUN: 27 mg/dL — ABNORMAL HIGH (ref 6–23)
CO2: 29 mEq/L (ref 19–32)
Calcium: 9 mg/dL (ref 8.4–10.5)
Chloride: 105 mEq/L (ref 96–112)
Creatinine, Ser: 1.66 mg/dL — ABNORMAL HIGH (ref 0.40–1.20)
GFR: 32.59 mL/min — ABNORMAL LOW (ref >60.00)
Glucose, Bld: 86 mg/dL (ref 70–99)
Potassium: 4.7 mEq/L (ref 3.5–5.1)
Sodium: 141 mEq/L (ref 135–145)
Total Bilirubin: 0.3 mg/dL (ref 0.2–1.2)
Total Protein: 6.4 g/dL (ref 6.0–8.3)

## 2021-02-08 LAB — IRON: Iron: 75 ug/dL (ref 42–145)

## 2021-02-08 LAB — CBC WITH DIFFERENTIAL/PLATELET
Basophils Absolute: 0.1 10*3/uL (ref 0.0–0.1)
Basophils Relative: 0.8 % (ref 0.0–3.0)
Eosinophils Absolute: 0.2 10*3/uL (ref 0.0–0.7)
Eosinophils Relative: 2.1 % (ref 0.0–5.0)
HCT: 35.9 % — ABNORMAL LOW (ref 36.0–46.0)
Hemoglobin: 12.1 g/dL (ref 12.0–15.0)
Lymphocytes Relative: 21 % (ref 12.0–46.0)
Lymphs Abs: 2 10*3/uL (ref 0.7–4.0)
MCHC: 33.7 g/dL (ref 30.0–36.0)
MCV: 89.9 fl (ref 78.0–100.0)
Monocytes Absolute: 0.7 10*3/uL (ref 0.1–1.0)
Monocytes Relative: 7.6 % (ref 3.0–12.0)
Neutro Abs: 6.6 10*3/uL (ref 1.4–7.7)
Neutrophils Relative %: 68.5 % (ref 43.0–77.0)
Platelets: 335 10*3/uL (ref 150.0–400.0)
RBC: 3.99 Mil/uL (ref 3.87–5.11)
RDW: 13.5 % (ref 11.5–15.5)
WBC: 9.6 10*3/uL (ref 4.0–10.5)

## 2021-02-08 LAB — TSH: TSH: 4.08 u[IU]/mL (ref 0.35–4.50)

## 2021-02-09 ENCOUNTER — Other Ambulatory Visit: Payer: Self-pay | Admitting: Family Medicine

## 2021-02-09 DIAGNOSIS — Z862 Personal history of diseases of the blood and blood-forming organs and certain disorders involving the immune mechanism: Secondary | ICD-10-CM | POA: Insufficient documentation

## 2021-02-09 DIAGNOSIS — R7989 Other specified abnormal findings of blood chemistry: Secondary | ICD-10-CM

## 2021-02-09 DIAGNOSIS — R000 Tachycardia, unspecified: Secondary | ICD-10-CM | POA: Insufficient documentation

## 2021-02-09 NOTE — Assessment & Plan Note (Signed)
See notes on follow-up labs.  Not on iron currently.

## 2021-02-09 NOTE — Assessment & Plan Note (Signed)
Unclear cause.  Could be paroxysmal A. fib versus SVT.  She notes that her heart rate can be fast episodically but it still feels like it is regular.  No chest pain or symptoms currently.  Still okay for outpatient follow-up.  Since she is not having frequent symptoms I do not think it makes sense to preemptively put her on a beta-blocker.  I think it makes sense to check her labs and if those are normal set up ambulatory monitoring.  EKG discussed with patient at office visit.  She agrees with plan.

## 2021-02-13 ENCOUNTER — Other Ambulatory Visit: Payer: Self-pay | Admitting: Family Medicine

## 2021-02-13 DIAGNOSIS — R000 Tachycardia, unspecified: Secondary | ICD-10-CM

## 2021-02-15 NOTE — Telephone Encounter (Signed)
Patient is returning a call. She would like you to leave a detailed message. EM

## 2021-02-16 NOTE — Telephone Encounter (Signed)
LMTCB

## 2021-02-16 NOTE — Telephone Encounter (Signed)
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 Wanted to know about the next steps of from the labs

## 2021-02-17 ENCOUNTER — Emergency Department (HOSPITAL_BASED_OUTPATIENT_CLINIC_OR_DEPARTMENT_OTHER): Payer: BC Managed Care – PPO

## 2021-02-17 ENCOUNTER — Emergency Department (HOSPITAL_BASED_OUTPATIENT_CLINIC_OR_DEPARTMENT_OTHER)
Admission: EM | Admit: 2021-02-17 | Discharge: 2021-02-17 | Disposition: A | Discharge status: Home / Self Care | Payer: BC Managed Care – PPO | Attending: Emergency Medicine | Admitting: Emergency Medicine

## 2021-02-17 ENCOUNTER — Encounter (HOSPITAL_BASED_OUTPATIENT_CLINIC_OR_DEPARTMENT_OTHER): Payer: Self-pay | Admitting: *Deleted

## 2021-02-17 ENCOUNTER — Other Ambulatory Visit: Payer: Self-pay

## 2021-02-17 DIAGNOSIS — W010XXA Fall on same level from slipping, tripping and stumbling without subsequent striking against object, initial encounter: Secondary | ICD-10-CM | POA: Insufficient documentation

## 2021-02-17 DIAGNOSIS — M25511 Pain in right shoulder: Secondary | ICD-10-CM | POA: Insufficient documentation

## 2021-02-17 DIAGNOSIS — Z79899 Other long term (current) drug therapy: Secondary | ICD-10-CM | POA: Diagnosis not present

## 2021-02-17 DIAGNOSIS — I10 Essential (primary) hypertension: Secondary | ICD-10-CM | POA: Insufficient documentation

## 2021-02-17 DIAGNOSIS — M19011 Primary osteoarthritis, right shoulder: Secondary | ICD-10-CM | POA: Diagnosis not present

## 2021-02-17 DIAGNOSIS — S43401A Unspecified sprain of right shoulder joint, initial encounter: Secondary | ICD-10-CM

## 2021-02-17 DIAGNOSIS — M25521 Pain in right elbow: Secondary | ICD-10-CM | POA: Insufficient documentation

## 2021-02-17 DIAGNOSIS — Z91040 Latex allergy status: Secondary | ICD-10-CM | POA: Insufficient documentation

## 2021-02-17 DIAGNOSIS — Z043 Encounter for examination and observation following other accident: Secondary | ICD-10-CM | POA: Diagnosis not present

## 2021-02-17 NOTE — ED Provider Notes (Signed)
[facility] EM[facility] DEPARTMENT Provider Note   CSN: [id number] Arrival date & time: 02/17/21  1755     History Chief Complaint  Patient presents with  . Fall    Carmen Buchanan is a 65 y.o. female hx of GERD, hypertension, here presenting with fall and right shoulder pain.  Patient works in the [employer].  She states that while she was at work several days ago, she accidentally tripped and fell onto outstretched hand on the right arm.  She states that she has right shoulder and elbow pain afterwards.  Denies any wrist or forearm or hand pain. She is been taking ibuprofen for the last several days but had her kidney function checked recently and it was worse than baseline so she was told she can only take Tylenol.  She denies any head injury or other injuries.  She states that she just want to get some x-rays and get checked out.  The history is provided by the patient.       Past Medical History:  Diagnosis Date  . Anemia    past hx since bariatric surgery 2012- Iron infusion last 09-2019  . Anxiety    much improved after weight loss started in late 2012  . Arthritis   . Complication of anesthesia    after hemorrhoid surgery-inability to sleep for 3 weeks  . GERD (gastroesophageal reflux disease)   . Hemorrhoid   . HTN (hypertension)    stopped meds after bariatric surgery  . IBS (irritable bowel syndrome)    intermittent  . Migraine, unspecified, without mention of intractable migraine without mention of status migrainosus   . Obesity   . Renal stones     Patient Active Problem List   Diagnosis Date Noted  . Tachycardia 02/09/2021  . History of anemia 02/09/2021  . Advance care planning 11/12/2019  . Routine general medical examination at a health care facility 04/30/2016  . Renal stone 06/07/2013  . Hand dermatitis 10/17/2012  . Hemorrhoid 11/08/2011  . S/P gastric bypass 07/28/2011  . IRRITABLE BOWEL SYNDROME 12/10/2008  . OBESITY 12/30/2007  .  Anxiety state 12/30/2007  . MIGRAINE HEADACHE 12/30/2007  . HYPERTENSION 12/30/2007    Past Surgical History:  Procedure Laterality Date  . ABLATION     uterus  . CESAREAN SECTION    . CYSTOSCOPY N/A 07/14/2013   Procedure: CYSTOSCOPY;  Surgeon: Alexis Frock, MD;  Location: [facility];  Service: Urology;  Laterality: N/A;  . GASTRIC BYPASS     sleeve procedure 06/2011 in [facility]  . Gastro Mini Bypass  2012  . [place]  2013  . LASER ABLATION CONDYLOMA CERVICAL / VULVAR  5/04   uncontrollable bleeding  . LUMBAR DISC SURGERY  7/01   L4-5; Dr. Rolin Barry  . NEPHROLITHOTOMY Right 07/14/2013   Procedure: NEPHROLITHOTOMY PERCUTANEOUS  Procedure: Right 1st stage Percutaneous Nephrostolithotomy with surgeon access and cystoscopy;  Surgeon: Alexis Frock, MD;  Location: [facility];  Service: Urology;  Laterality: Right;  . NEPHROLITHOTOMY Right 07/16/2013   Procedure: NEPHROLITHOTOMY PERCUTANEOUS SECOND LOOK, diagnostic ureteroscopy and stent placement on the right ;  Surgeon: Alexis Frock, MD;  Location: [facility];  Service: Urology;  Laterality: Right;  . TONSILLECTOMY AND ADENOIDECTOMY  1970     OB History   No obstetric history on file.     Family History  Problem Relation Age of Onset  . Coronary artery disease Father        MI; CABG  . Colon  polyps Father   . Aortic aneurysm Mother        on beta blocker  . Other Mother        Cardiovascualr disease  . Ulcers Mother   . Colon polyps Mother   . Heart disease Mother   . Colon polyps Other        Family history  . Colon cancer Neg Hx   . Breast cancer Neg Hx   . Esophageal cancer Neg Hx   . Rectal cancer Neg Hx   . Stomach cancer Neg Hx     Social History   Tobacco Use  . Smoking status: Never Smoker  . Smokeless tobacco: Never Used  Substance Use Topics  . Alcohol use: Not Currently  . Drug use: No    Home Medications Prior to Admission medications   Medication Sig Start Date End Date Taking? Authorizing  Provider  betamethasone dipropionate (DIPROLENE) 0.05 % cream Apply 1 application topically 2 (two) times daily as needed (rash).   Yes [provider]  buPROPion (WELLBUTRIN SR) 150 MG 12 hr tablet TAKE 1 TABLET BY MOUTH TWICE A DAY 12/20/20  Yes Tonia Ghent, MD  FLUoxetine (PROZAC) 40 MG capsule TAKE 2 CAPSULES BY MOUTH DAILY 12/20/20  Yes Tonia Ghent, MD  LORazepam (ATIVAN) 0.5 MG tablet TAKE 1 TABLET (0.5 MG TOTAL) BY MOUTH DAILY AS NEEDED. 12/05/20  Yes Tonia Ghent, MD  omeprazole (PRILOSEC) 20 MG capsule Take 1 capsule (20 mg total) by mouth 2 (two) times daily before a meal. 11/10/19  Yes Tonia Ghent, MD  polyethylene glycol (MIRALAX / GLYCOLAX) packet Take 17 g by mouth daily.   Yes [provider]    Allergies    Ciprofloxacin, Latex, Morphine, and Nsaids  Review of Systems   Review of Systems  Musculoskeletal:       R shoulder pain   All other systems reviewed and are negative.   Physical Exam Updated Vital Signs BP 129/75 (BP Location: Left Arm)   Pulse 66   Temp 98.4 F (36.9 C) (Oral)   Resp 18   Ht 5\' 9"  (1.753 m)   Wt 103.9 kg   SpO2 95%   BMI 33.83 kg/m   Physical Exam Vitals and nursing note reviewed.  Constitutional:      Appearance: Normal appearance.  HENT:     Head: Normocephalic and atraumatic.     Nose: Nose normal.     Mouth/Throat:     Mouth: Mucous membranes are moist.  Eyes:     Extraocular Movements: Extraocular movements intact.     Pupils: Pupils are equal, round, and reactive to light.  Cardiovascular:     Rate and Rhythm: Normal rate and regular rhythm.     Pulses: Normal pulses.     Heart sounds: Normal heart sounds.  Pulmonary:     Effort: Pulmonary effort is normal.     Breath sounds: Normal breath sounds.  Abdominal:     General: Abdomen is flat.     Palpations: Abdomen is soft.  Musculoskeletal:     Cervical back: Normal range of motion and neck supple.     Comments: Right shoulder decreased  range of motion.  However she does not appear to have obvious deformity.  Patient has mild tenderness of the right AC joint.  Patient also has a right elbow abrasion with mild tenderness over the elbow. No obvious forearm or humerus pain or deformity.  Patient has no obvious wrist  or hand deformity or tenderness.  There is no midline spinal tenderness.  Patient has no other extremity deformities or injuries  Skin:    General: Skin is warm.     Capillary Refill: Capillary refill takes less than 2 seconds.  Neurological:     General: No focal deficit present.     Mental Status: She is alert and oriented to person, place, and time.  Psychiatric:        Mood and Affect: Mood normal.        Behavior: Behavior normal.     ED Results / Procedures / Treatments   Labs (all labs ordered are listed, but only abnormal results are displayed) Labs Reviewed - No data to display  EKG None  Radiology DG Shoulder Right  Result Date: 02/17/2021 CLINICAL DATA:  Fall EXAM: RIGHT SHOULDER - 2+ VIEW COMPARISON:  None. FINDINGS: No fracture or malalignment. Moderate AC joint degenerative change. Slightly narrowed subacromial space suggesting rotator cuff disease IMPRESSION: No acute osseous abnormality Electronically Signed   By: Donavan Foil M.D.   On: 02/17/2021 19:11   DG Elbow Complete Right  Result Date: 02/17/2021 CLINICAL DATA:  Fall EXAM: RIGHT ELBOW - COMPLETE 3+ VIEW COMPARISON:  None. FINDINGS: There is no evidence of fracture, dislocation, or joint effusion. There is no evidence of arthropathy or other focal bone abnormality. Soft tissues are unremarkable. IMPRESSION: Negative. Electronically Signed   By: Donavan Foil M.D.   On: 02/17/2021 19:12    Procedures Procedures   Medications Ordered in ED Medications - No data to display  ED Course  I have reviewed the triage vital signs and the nursing notes.  Pertinent labs & imaging results that were available during my care of the patient  were reviewed by me and considered in my medical decision making (see chart for details).    MDM Rules/Calculators/A&P                         Carmen Buchanan is a 65 y.o. female here presenting with right shoulder and elbow pain after fall several days ago. I think likely sprain.  Will get x-rays to rule out fracture  7:30 PM X-rays did not show any fracture.  I think likely sprain.  Stable for discharge.  Told her that if she has persistent pain she may need to see Ortho for MRI.   Final Clinical Impression(s) / ED Diagnoses Final diagnoses:  None    Rx / DC Orders ED Discharge Orders    None       Drenda Freeze, MD 02/17/21 1931

## 2021-02-17 NOTE — Discharge Instructions (Addendum)
You likely sprained your shoulder.  Continue Tylenol as needed for pain.  See orthopedic doctor for follow-up if you have persistent pain  Return to ER if you have worse shoulder pain or elbow pain.

## 2021-02-17 NOTE — ED Triage Notes (Signed)
She tripped and fell. Injury to her right shoulder and  right hand. Both knees are sore. She is ambulatory.

## 2021-02-23 ENCOUNTER — Telehealth: Payer: Self-pay | Admitting: Family Medicine

## 2021-02-23 NOTE — Telephone Encounter (Signed)
Thanks, needs follow-up bmet.  The order is in.

## 2021-02-23 NOTE — Telephone Encounter (Signed)
Carmen Buchanan called in she stated that she received a message about getting repeat labs. I didn't see anything but it schedule her 6/3 @ 8:50

## 2021-02-25 ENCOUNTER — Other Ambulatory Visit (INDEPENDENT_AMBULATORY_CARE_PROVIDER_SITE_OTHER): Payer: BC Managed Care – PPO

## 2021-02-25 ENCOUNTER — Other Ambulatory Visit: Payer: Self-pay

## 2021-02-25 DIAGNOSIS — R7989 Other specified abnormal findings of blood chemistry: Secondary | ICD-10-CM

## 2021-02-28 LAB — BASIC METABOLIC PANEL
BUN: 29 mg/dL — ABNORMAL HIGH (ref 6–23)
CO2: 25 mEq/L (ref 19–32)
Calcium: 8.8 mg/dL (ref 8.4–10.5)
Chloride: 101 mEq/L (ref 96–112)
Creatinine, Ser: 1.51 mg/dL — ABNORMAL HIGH (ref 0.40–1.20)
GFR: 36.5 mL/min — ABNORMAL LOW (ref >60.00)
Glucose, Bld: 85 mg/dL (ref 70–99)
Potassium: 4.5 mEq/L (ref 3.5–5.1)
Sodium: 135 mEq/L (ref 135–145)

## 2021-03-02 ENCOUNTER — Encounter: Payer: Self-pay | Admitting: Family Medicine

## 2021-03-02 ENCOUNTER — Other Ambulatory Visit: Payer: Self-pay | Admitting: Family Medicine

## 2021-03-02 DIAGNOSIS — R7989 Other specified abnormal findings of blood chemistry: Secondary | ICD-10-CM

## 2021-03-18 ENCOUNTER — Encounter: Payer: Self-pay | Admitting: *Deleted

## 2021-04-07 ENCOUNTER — Ambulatory Visit
Admission: RE | Admit: 2021-04-07 | Discharge: 2021-04-07 | Disposition: A | Discharge status: Home / Self Care | Payer: BC Managed Care – PPO | Source: Ambulatory Visit | Attending: Family Medicine | Admitting: Family Medicine

## 2021-04-07 ENCOUNTER — Other Ambulatory Visit: Payer: Self-pay

## 2021-04-07 DIAGNOSIS — R7989 Other specified abnormal findings of blood chemistry: Secondary | ICD-10-CM

## 2021-04-15 ENCOUNTER — Other Ambulatory Visit: Payer: Self-pay

## 2021-04-15 ENCOUNTER — Other Ambulatory Visit (INDEPENDENT_AMBULATORY_CARE_PROVIDER_SITE_OTHER): Payer: BC Managed Care – PPO

## 2021-04-15 DIAGNOSIS — R7989 Other specified abnormal findings of blood chemistry: Secondary | ICD-10-CM | POA: Diagnosis not present

## 2021-04-15 LAB — BASIC METABOLIC PANEL
BUN: 25 mg/dL — ABNORMAL HIGH (ref 6–23)
CO2: 29 mEq/L (ref 19–32)
Calcium: 9.2 mg/dL (ref 8.4–10.5)
Chloride: 104 mEq/L (ref 96–112)
Creatinine, Ser: 1.55 mg/dL — ABNORMAL HIGH (ref 0.40–1.20)
GFR: 35.34 mL/min — ABNORMAL LOW (ref >60.00)
Glucose, Bld: 91 mg/dL (ref 70–99)
Potassium: 4.1 mEq/L (ref 3.5–5.1)
Sodium: 140 mEq/L (ref 135–145)

## 2021-04-18 ENCOUNTER — Other Ambulatory Visit: Payer: Self-pay

## 2021-04-18 ENCOUNTER — Other Ambulatory Visit: Payer: Self-pay | Admitting: Family Medicine

## 2021-04-18 ENCOUNTER — Other Ambulatory Visit (INDEPENDENT_AMBULATORY_CARE_PROVIDER_SITE_OTHER): Payer: BC Managed Care – PPO

## 2021-04-18 DIAGNOSIS — R7989 Other specified abnormal findings of blood chemistry: Secondary | ICD-10-CM

## 2021-04-18 LAB — URINALYSIS, ROUTINE W REFLEX MICROSCOPIC
Bilirubin Urine: NEGATIVE
Hgb urine dipstick: NEGATIVE
Ketones, ur: NEGATIVE
Leukocytes,Ua: NEGATIVE
Nitrite: NEGATIVE
Specific Gravity, Urine: 1.025 (ref 1.000–1.030)
Total Protein, Urine: NEGATIVE
Urine Glucose: NEGATIVE
Urobilinogen, UA: 0.2 (ref 0.0–1.0)
WBC, UA: NONE SEEN (ref 0–?)
pH: 6 (ref 5.0–8.0)

## 2021-04-19 ENCOUNTER — Encounter: Payer: Self-pay | Admitting: Family Medicine

## 2021-05-02 ENCOUNTER — Encounter: Payer: Self-pay | Admitting: Family Medicine

## 2021-05-02 NOTE — Telephone Encounter (Signed)
Was patient supposed to go to a Kidney doctor in [place]? Looks like she was sent to [facility] in [place].

## 2021-05-03 NOTE — Telephone Encounter (Signed)
This should be local.  I didn't mean for this to be out of town.  Please let me know if I need to put in a new referral.  Please talk to me about this.  Thanks.

## 2021-05-09 ENCOUNTER — Encounter: Payer: Self-pay | Admitting: Cardiovascular Disease

## 2021-05-09 ENCOUNTER — Ambulatory Visit: Payer: BC Managed Care – PPO | Admitting: Cardiovascular Disease

## 2021-05-09 ENCOUNTER — Ambulatory Visit (INDEPENDENT_AMBULATORY_CARE_PROVIDER_SITE_OTHER): Payer: BC Managed Care – PPO

## 2021-05-09 ENCOUNTER — Other Ambulatory Visit: Payer: Self-pay

## 2021-05-09 VITALS — BP 128/70 | HR 64 | Ht 68.0 in | Wt 230.4 lb

## 2021-05-09 DIAGNOSIS — E6609 Other obesity due to excess calories: Secondary | ICD-10-CM | POA: Diagnosis not present

## 2021-05-09 DIAGNOSIS — I479 Paroxysmal tachycardia, unspecified: Secondary | ICD-10-CM | POA: Diagnosis not present

## 2021-05-09 DIAGNOSIS — Z6834 Body mass index (BMI) 34.0-34.9, adult: Secondary | ICD-10-CM

## 2021-05-09 DIAGNOSIS — I10 Essential (primary) hypertension: Secondary | ICD-10-CM

## 2021-05-09 DIAGNOSIS — Z9884 Bariatric surgery status: Secondary | ICD-10-CM

## 2021-05-09 NOTE — Patient Instructions (Addendum)
Medication Instructions:  No changes  If you need a refill on your cardiac medications before your next appointment, please call your pharmacy.    Lab work: No new labs needed  Testing/Procedures: Zio monitor (heart monitor) 14 days   Follow-Up: At [facility], you and your health needs are our priority.  As part of our continuing mission to provide you with exceptional heart care, we have created designated Provider Care Teams.  These Care Teams include your primary Cardiologist (physician) and Advanced Practice Providers (APPs -  Physician Assistants and Nurse Practitioners) who all work together to provide you with the care you need, when you need it.  You will need a follow up appointment as needed  Providers on your designated Care Team:   Murray Hodgkins, NP Christell Faith, PA-C Marrianne Mood, PA-C Cadence [place], [place]  COVID-19 Vaccine Information can be found at: [email or web address] For questions related to vaccine distribution or appointments, please email [email or web address] or call [phone].   Heart monitor (Zio patch) for 2 weeks (14 days)   Your physician has recommended that you wear a Zio monitor. This monitor is a medical device that records the heart's electrical activity. Doctors most often use these monitors to diagnose arrhythmias. Arrhythmias are problems with the speed or rhythm of the heartbeat. The monitor is a small device applied to your chest. You can wear one while you do your normal daily activities. While wearing this monitor if you have any symptoms to push the button and record what you felt. Once you have worn this monitor for the period of time provider prescribed (Usually 14 days), you will return the monitor device in the postage paid box. Once it is returned they will download the data collected and provide [place] with a report which the provider will then review and we will call you with  those results. Important tips:  Avoid showering during the first 24 hours of wearing the monitor. Avoid excessive sweating to help maximize wear time. Do not submerge the device, no hot tubs, and no swimming pools. Keep any lotions or oils away from the patch. After 24 hours you may shower with the patch on. Take brief showers with your back facing the shower head.  Do not remove patch once it has been placed because that will interrupt data and decrease adhesive wear time. Push the button when you have any symptoms and write down what you were feeling. Once you have completed wearing your monitor, remove and place into box which has postage paid and place in your outgoing mailbox.  If for some reason you have misplaced your box then call our office and we can provide another box and/or mail it off for you.

## 2021-05-09 NOTE — Progress Notes (Signed)
Cardiology Office Note  Date:  05/09/2021   ID:  Buchanan, Carmen 03/05/1956, MRN [id number]  PCP:  Tonia Ghent, MD   Chief Complaint  Patient presents with   New Patient (Initial Visit)    Ref by Dr. Damita Dunnings for Tachycardia. Patient c/o fluttering in chest, rapid heartbeats, bloodshot eye and shortness of breath with over exertion. Medications reviewed by the patient verbally.     HPI:  [name] is a 65 year old woman with past medical history of Anemia Obesity Referral from Dr. Damita Dunnings for consultation of her paroxysmal tachycardia  Dating back to May 2022 when she saw Dr. Damita Dunnings, Reported having erratic episodes of tachycardia, random onset, feels heart is racing can last a few minutes Resolve on their own Presents without warning, no triggers  Recent stressors including Doing kitchen remodel Works with dental office Anxiety issue at baseline  Nonsmoker No diabetes  No regular exercise program  EKG personally reviewed by myself on todays visit NSR rate   Family history reviewed Father with CAD, MI mitral valve prolapse Mother with aortic valve fused, ascending aorta dilation   PMH:   has a past medical history of Anemia, Anxiety, Arthritis, Complication of anesthesia, GERD (gastroesophageal reflux disease), Hemorrhoid, HTN (hypertension), IBS (irritable bowel syndrome), Migraine, unspecified, without mention of intractable migraine without mention of status migrainosus, Obesity, and Renal stones.  PSH:    Past Surgical History:  Procedure Laterality Date   ABLATION     uterus   CESAREAN SECTION     CYSTOSCOPY N/A 07/14/2013   Procedure: CYSTOSCOPY;  Surgeon: Alexis Frock, MD;  Location: [facility];  Service: Urology;  Laterality: N/A;   GASTRIC BYPASS     sleeve procedure 06/2011 in [place]  2012   HEMORRHOID SURGERY  2013   LASER ABLATION CONDYLOMA CERVICAL / VULVAR  5/04   uncontrollable bleeding   LUMBAR [place] SURGERY   7/01   L4-5; Dr. Rolin Barry   NEPHROLITHOTOMY Right 07/14/2013   Procedure: NEPHROLITHOTOMY PERCUTANEOUS  Procedure: Right 1st stage Percutaneous Nephrostolithotomy with surgeon access and cystoscopy;  Surgeon: Alexis Frock, MD;  Location: [facility];  Service: Urology;  Laterality: Right;   NEPHROLITHOTOMY Right 07/16/2013   Procedure: NEPHROLITHOTOMY PERCUTANEOUS SECOND LOOK, diagnostic ureteroscopy and stent placement on the right ;  Surgeon: Alexis Frock, MD;  Location: [facility];  Service: Urology;  Laterality: Right;   TONSILLECTOMY AND ADENOIDECTOMY  1970    Current Outpatient Medications  Medication Sig Dispense Refill   betamethasone dipropionate (DIPROLENE) 0.05 % cream Apply 1 application topically 2 (two) times daily as needed (rash).     buPROPion (WELLBUTRIN SR) 150 MG 12 hr tablet TAKE 1 TABLET BY MOUTH TWICE A DAY 180 tablet 3   FLUoxetine (PROZAC) 40 MG capsule TAKE 2 CAPSULES BY MOUTH DAILY 180 capsule 3   LORazepam (ATIVAN) 0.5 MG tablet TAKE 1 TABLET (0.5 MG TOTAL) BY MOUTH DAILY AS NEEDED. 30 tablet 1   omeprazole (PRILOSEC) 20 MG capsule Take 1 capsule (20 mg total) by mouth 2 (two) times daily before a meal. 180 capsule 3   polyethylene glycol (MIRALAX / GLYCOLAX) packet Take 17 g by mouth daily.     No current facility-administered medications for this visit.    Allergies:   Ciprofloxacin, Latex, Morphine, and Nsaids   Social History:  The patient  reports that she has never smoked. She has never used smokeless tobacco. She reports that she does not currently use alcohol.  She reports that she does not use drugs.   Family History:   family history includes Aortic aneurysm in her mother; Colon polyps in her father, mother, and another family member; Coronary artery disease in her father; Heart disease in her mother; Other in her mother; Ulcers in her mother.    Review of Systems: Review of Systems  Constitutional: Negative.   HENT: Negative.    Respiratory: Negative.     Cardiovascular:  Positive for palpitations.  Gastrointestinal: Negative.   Musculoskeletal: Negative.   Neurological: Negative.   Psychiatric/Behavioral: Negative.    All other systems reviewed and are negative.   PHYSICAL EXAM: VS:  BP 128/70 (BP Location: Right Arm, Patient Position: Sitting, Cuff Size: Large)   Pulse 64   Ht '5\' 8"'$  (1.727 m)   Wt 230 lb 6 oz (104.5 kg)   SpO2 98%   BMI 35.03 kg/m  , BMI Body mass index is 35.03 kg/m. GEN: Well nourished, well developed, in no acute distress HEENT: normal Neck: no JVD, carotid bruits, or masses Cardiac: RRR; no murmurs, rubs, or gallops,no edema  Respiratory:  clear to auscultation bilaterally, normal work of breathing GI: soft, nontender, nondistended, + BS MS: no deformity or atrophy Skin: warm and dry, no rash Neuro:  Strength and sensation are intact Psych: euthymic mood, full affect  Recent Labs: 02/07/2021: ALT 15; Hemoglobin 12.1; Platelets 335.0; TSH 4.08 04/15/2021: BUN 25; Creatinine, Ser 1.55; Potassium 4.1; Sodium 140    Lipid Panel Lab Results  Component Value Date   CHOL 169 11/03/2019   HDL 62.90 11/03/2019   LDLCALC 71 11/03/2019   TRIG 174.0 (H) 11/03/2019      Wt Readings from Last 3 Encounters:  05/09/21 230 lb 6 oz (104.5 kg)  02/17/21 229 lb 0.9 oz (103.9 kg)  02/07/21 229 lb (103.9 kg)       ASSESSMENT AND PLAN:  Problem List Items Addressed This Visit     S/P gastric bypass   OBESITY   Other Visit Diagnoses     Paroxysmal tachycardia ([place])    -  Primary   Relevant Orders   EKG 12-Lead   LONG TERM MONITOR (3-14 DAYS)   Essential hypertension          Paroxysmal tachycardia Discussed various types of arrhythmia including atrial tachycardia, SVT, atrial fibrillation, atrial flutter Difficult to differentiate without additional data Recommended a ZIO monitor for further evaluation No changes to medications at this time Discussed medication such as beta-blockers which could  be started in the future either on as needed or regular basis  Obesity We have encouraged continued exercise, careful diet management in an effort to lose weight.  Essential hypertension Blood pressure is well controlled on today's visit. No changes made to the medications.    Total encounter time more than 60 minutes  Greater than 50% was spent in counseling and coordination of care with the patient  Ms. Rowles was evaluated in consultation with Dr. Damita Dunnings and will be referred back to his office for ongoing care of the issues detailed above    Signed, Esmond Plants, M.D., Ph.D. [place], [place]

## 2021-05-18 ENCOUNTER — Ambulatory Visit: Payer: Self-pay | Admitting: Internal Medicine

## 2021-05-19 DIAGNOSIS — I479 Paroxysmal tachycardia, unspecified: Secondary | ICD-10-CM | POA: Diagnosis not present

## 2021-06-01 ENCOUNTER — Telehealth: Payer: Self-pay

## 2021-06-01 NOTE — Telephone Encounter (Signed)
Was able to reach out to pt via phone and make contact to review their recent ZIO monitor results. Dr. Rockey Situ advised based on the current results   Event monitor  Normal sinus rhythm  2 short episodes of tachycardia, longest was 11 beats otherwise no significant arrhythmia  No other patient triggered events noted   Pt verbalized understanding, is thankful for the results call, all questions and concerns were address. Will call back for anything further, f/u as schedule.

## 2021-08-30 ENCOUNTER — Encounter: Payer: Self-pay | Admitting: Nurse Practitioner

## 2021-08-30 ENCOUNTER — Telehealth: Payer: BC Managed Care – PPO | Admitting: Nurse Practitioner

## 2021-08-30 ENCOUNTER — Other Ambulatory Visit: Payer: Self-pay

## 2021-08-30 VITALS — Temp 99.0°F

## 2021-08-30 DIAGNOSIS — U071 COVID-19: Secondary | ICD-10-CM | POA: Diagnosis not present

## 2021-08-30 DIAGNOSIS — R110 Nausea: Secondary | ICD-10-CM

## 2021-08-30 MED ORDER — MOLNUPIRAVIR EUA 200MG CAPSULE: 4.0000 | ORAL_CAPSULE | Freq: Two times a day (BID) | ORAL | 0 refills | Status: AC

## 2021-08-30 MED ORDER — ONDANSETRON 4 MG PO TBDP: 4.0000 mg | ORAL_TABLET | Freq: Two times a day (BID) | ORAL | 0 refills | Status: AC

## 2021-08-30 NOTE — Progress Notes (Signed)
Patient ID: Carmen Buchanan, female    DOB: August 17, 1956, 65 y.o.   MRN: [id number]  Virtual visit completed through [place], a video enabled telemedicine application. Due to national recommendations of social distancing due to COVID-19, a virtual visit is felt to be most appropriate for this patient at this time. Reviewed limitations, risks, security and privacy concerns of performing a virtual visit and the availability of in person appointments. I also reviewed that there may be a patient responsible charge related to this service. The patient agreed to proceed.   Patient location: home Provider location: Carmen Buchanan at [facility], office Persons participating in this virtual visit: patient, provider   If any vitals were documented, they were collected by patient at home unless specified below.    Temp 99 F (37.2 C) Comment: per patient   CC: Covid 19 Subjective:   HPI: Carmen Buchanan is a 65 y.o. female presenting on 08/30/2021 for Covid Positive (On 08/30/21, sx started on 08/28/21-head congestion, sore throat, bad headache, earache, no appetite, nausea, fever-101 last night. Has taking Mucinnex/sinus max. 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  Relevant past medical, surgical, family and social history reviewed and updated as indicated. Interim medical history since our last visit reviewed. Allergies and medications reviewed and updated. Outpatient Medications Prior to Visit  Medication Sig Dispense Refill   buPROPion (WELLBUTRIN SR) 150 MG 12 hr tablet TAKE 1 TABLET BY MOUTH TWICE A DAY 180 tablet 3   FLUoxetine (PROZAC) 40 MG capsule TAKE 2 CAPSULES BY MOUTH DAILY 180 capsule 3   LORazepam (ATIVAN) 0.5 MG tablet TAKE 1 TABLET (0.5  MG TOTAL) BY MOUTH DAILY AS NEEDED. 30 tablet 1   omeprazole (PRILOSEC) 20 MG capsule Take 1 capsule (20 mg total) by mouth 2 (two) times daily before a meal. 180 capsule 3   polyethylene glycol (MIRALAX / GLYCOLAX) packet Take 17 g by mouth daily.     betamethasone dipropionate (DIPROLENE) 0.05 % cream Apply 1 application topically 2 (two) times daily as needed (rash).     No facility-administered medications prior to visit.     Per HPI unless specifically indicated in ROS section below Review of Systems  Constitutional:  Positive for appetite change, fatigue and fever. Negative for chills.  HENT:  Positive for congestion, ear pain (full and achy), postnasal drip, sinus pressure, sinus pain and sore throat.   Respiratory:  Positive for cough and shortness of breath (at rest worse with exertion).   Cardiovascular:  Negative for chest pain.  Gastrointestinal:  Positive for nausea. Negative for abdominal pain, diarrhea and vomiting.  Musculoskeletal:  Negative for arthralgias and myalgias.  Neurological:  Positive for headaches.  Objective:  Temp 99 F (37.2 C) Comment: per patient  Wt Readings from Last 3 Encounters:  05/09/21 230 lb 6 oz (104.5 kg)  02/17/21 229 lb 0.9 oz (103.9 kg)  02/07/21 229 lb (103.9 kg)       Physical exam: Gen: alert, NAD, not ill appearing Pulm: speaks in complete sentences without increased work of breathing Psych: normal mood, normal thought content      Results for orders placed or performed in visit on 04/18/21  Urinalysis, Routine w  reflex microscopic  Result Value Ref Range   Color, Urine YELLOW Yellow;Lt. Yellow;Straw;Dark Yellow;Amber;Green;Red;Brown   APPearance CLEAR Clear;Turbid;Slightly Cloudy;Cloudy   Specific Gravity, Urine 1.025 1.000 - 1.030   pH 6.0 5.0 - 8.0   Total Protein, Urine NEGATIVE Negative   Urine Glucose NEGATIVE Negative   Ketones, ur NEGATIVE Negative   Bilirubin Urine NEGATIVE Negative   Hgb urine dipstick NEGATIVE  Negative   Urobilinogen, UA 0.2 0.0 - 1.0   Leukocytes,Ua NEGATIVE Negative   Nitrite NEGATIVE Negative   WBC, UA none seen 0-2/hpf   RBC / HPF 0-2/hpf 0-2/hpf   Squamous Epithelial / LPF Rare(0-4/hpf) Rare(0-4/hpf)   Assessment & Plan:   Problem List Items Addressed This Visit       Other   COVID-19 - Primary    Patient tested positive for COVID-19.  Did discuss antiviral treatments inclusive of them being EUA only.  After joint discussion patient and I decided to use antiviral medication.  Did discuss common side effects of medication with patient.  Also discussed signs and symptoms when to seek urgent or emergent health care.  Did discuss quarantine guidelines in regards to the CDC recommendation.  Patient acknowledged  She may continue using over-the-counter treatments to help with symptom management.      Relevant Medications   molnupiravir EUA (LAGEVRIO) 200 mg CAPS capsule   Nausea    Patient is having nausea with her illness.  We will start her on ondansetron 4 mg twice daily as needed nausea vomiting.  Patient's last QTC was 427 back in August.  Continue to monitor      Relevant Medications   ondansetron (ZOFRAN-ODT) 4 MG disintegrating tablet     No orders of the defined types were placed in this encounter.  No orders of the defined types were placed in this encounter.   I discussed the assessment and treatment plan with the patient. The patient was provided an opportunity to ask questions and all were answered. The patient agreed with the plan and demonstrated an understanding of the instructions. The patient was advised to call back or seek an in-person evaluation if the symptoms worsen or if the condition fails to improve as anticipated.  Follow up plan: No follow-ups on file.  Romilda Garret, NP

## 2021-08-30 NOTE — Assessment & Plan Note (Addendum)
Patient tested positive for COVID-19.  Did discuss antiviral treatments inclusive of them being EUA only.  After joint discussion patient and I decided to use antiviral medication.  Did discuss common side effects of medication with patient.  Also discussed signs and symptoms when to seek urgent or emergent health care.  Did discuss quarantine guidelines in regards to the CDC recommendation.  Patient acknowledged  She may continue using over-the-counter treatments to help with symptom management.

## 2021-08-30 NOTE — Assessment & Plan Note (Signed)
Patient is having nausea with her illness.  We will start her on ondansetron 4 mg twice daily as needed nausea vomiting.  Patient's last QTC was 427 back in August.  Continue to monitor

## 2021-09-16 IMAGING — DX DG ABDOMEN 1V
2 series · 2 of 2 positions shown · non-contrast
Comparison: None.

CLINICAL DATA: Low back pain.  History of renal stones.

EXAM:
ABDOMEN - 1 VIEW

[abdomen kub (1 of 2)]
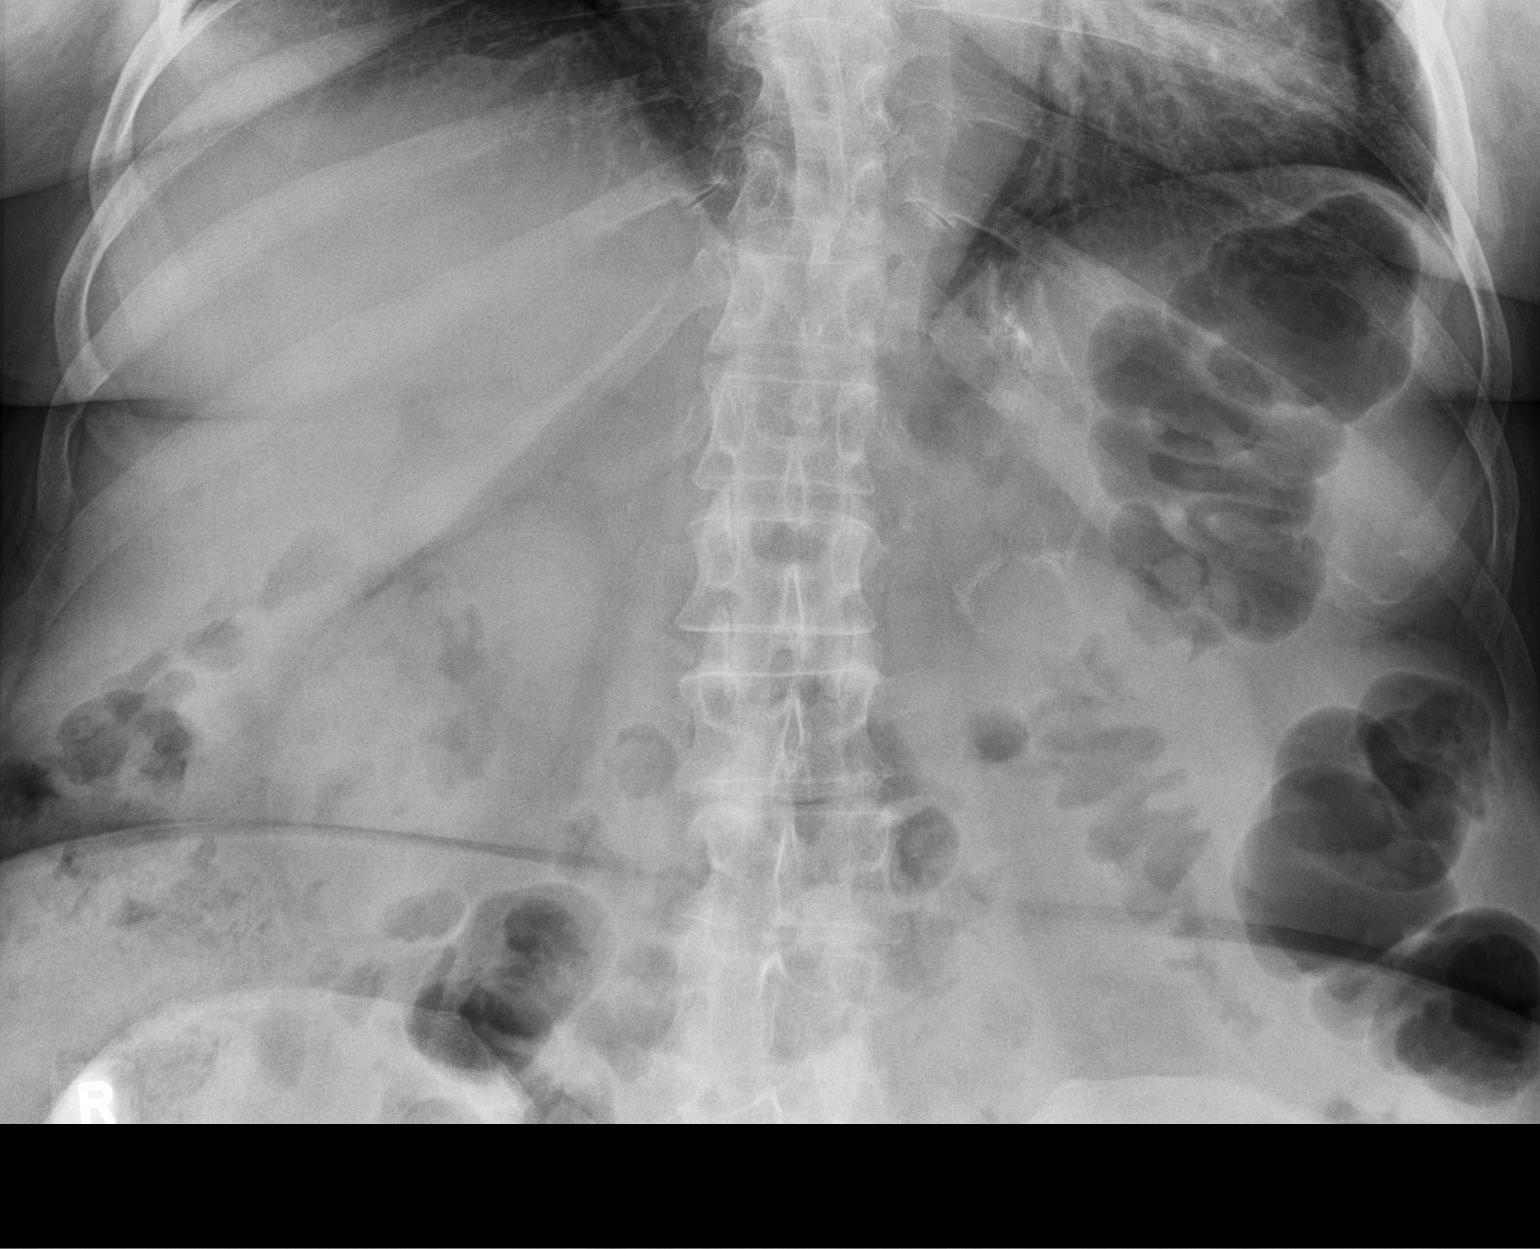

[abdomen kub (2 of 2)]
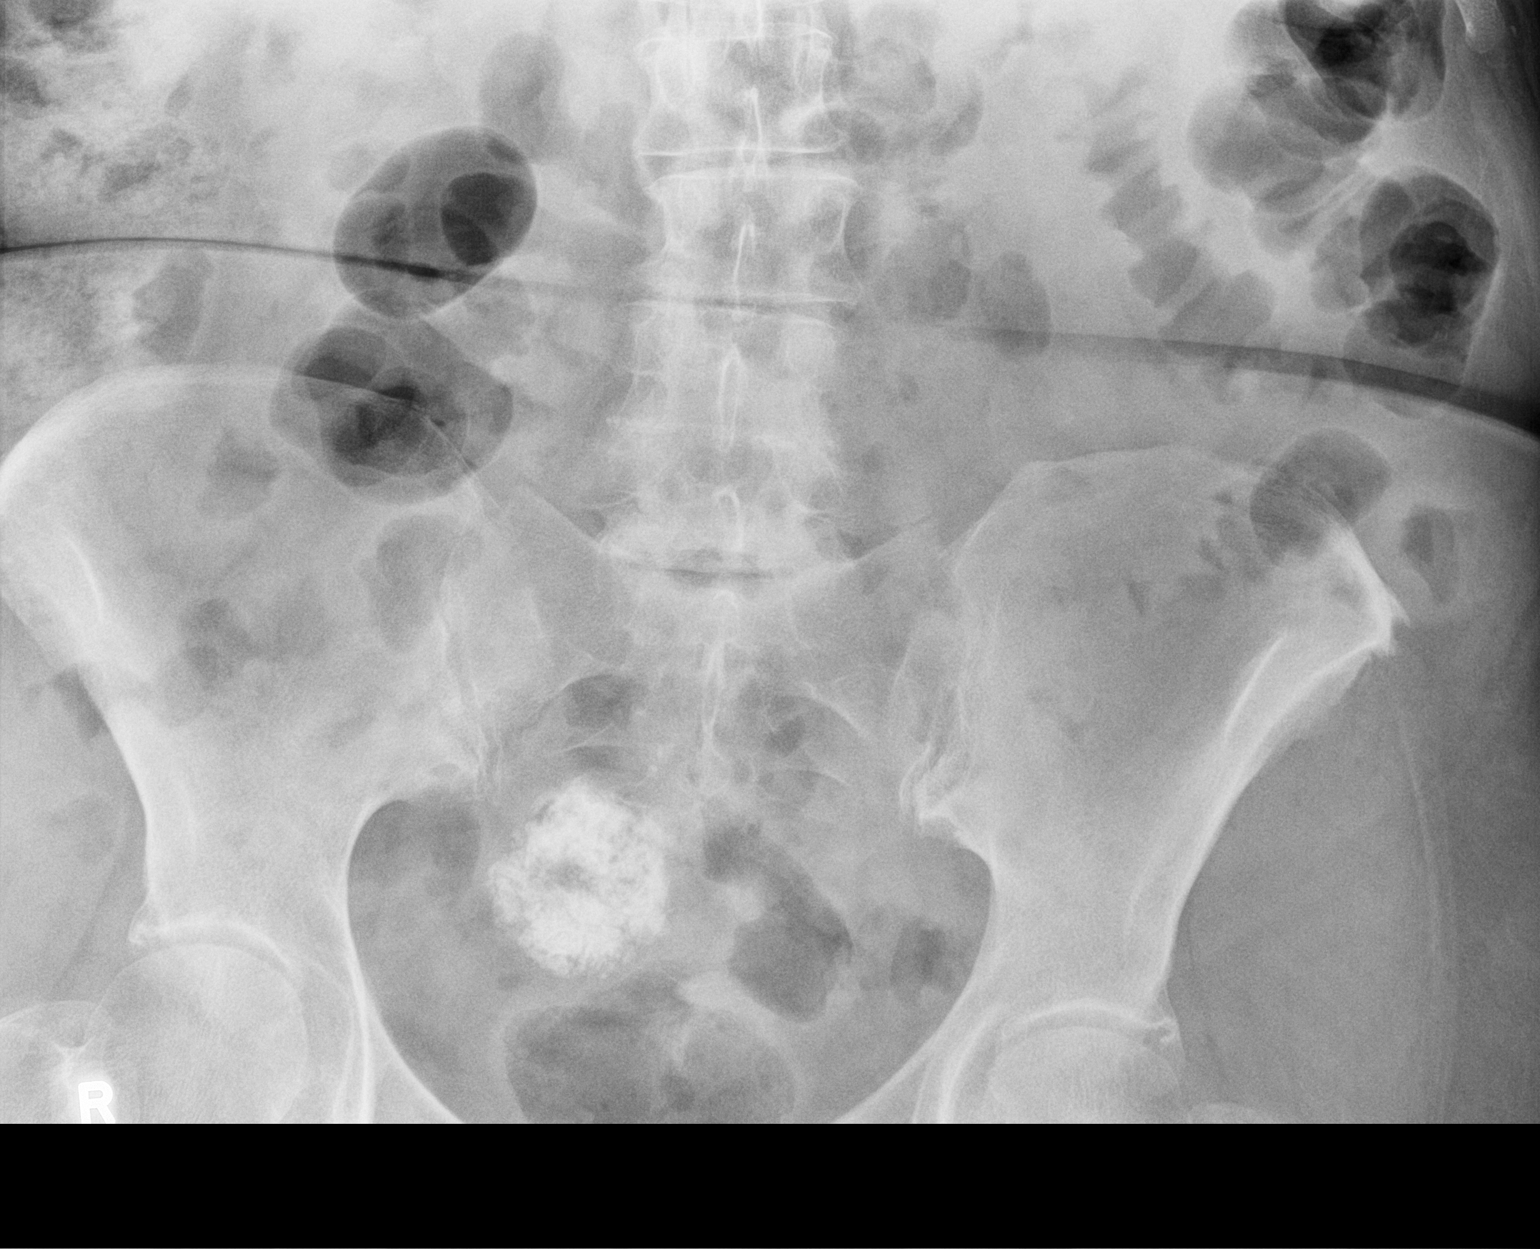

[2 of 2 positions shown; findings below may reference images not displayed]

FINDINGS: Nonobstructive bowel gas pattern. No organomegaly, free air. Rounded
calcification in the pelvis compatible with calcified fibroid. No
suspicious calcifications over the kidneys or expected course of the
ureters.
IMPRESSION: No acute findings.

## 2021-10-29 ENCOUNTER — Other Ambulatory Visit: Payer: Self-pay | Admitting: Family Medicine

## 2021-11-08 ENCOUNTER — Other Ambulatory Visit: Payer: Self-pay | Admitting: Family Medicine

## 2021-12-13 ENCOUNTER — Telehealth: Payer: 59 | Admitting: Physician Assistant

## 2021-12-13 DIAGNOSIS — A084 Viral intestinal infection, unspecified: Secondary | ICD-10-CM | POA: Diagnosis not present

## 2021-12-13 MED ORDER — DICYCLOMINE HCL 10 MG PO CAPS: 10.0000 mg | ORAL_CAPSULE | Freq: Three times a day (TID) | ORAL | 0 refills | Status: DC

## 2021-12-13 NOTE — Progress Notes (Signed)
?Virtual Visit Consent  ? ?Carmen Buchanan, you are scheduled for a virtual visit with a [facility] provider today.   ?  ?Just as with appointments in the office, your consent must be obtained to participate.  Your consent will be active for this visit and any virtual visit you may have with one of our providers in the next 365 days.   ?  ?If you have a MyChart account, a copy of this consent can be sent to you electronically.  All virtual visits are billed to your insurance company just like a traditional visit in the office.   ? ?As this is a virtual visit, video technology does not allow for your provider to perform a traditional examination.  This may limit your provider's ability to fully assess your condition.  If your provider identifies any concerns that need to be evaluated in person or the need to arrange testing (such as labs, EKG, etc.), we will make arrangements to do so.   ?  ?Although advances in technology are sophisticated, we cannot ensure that it will always work on either your end or our end.  If the connection with a video visit is poor, the visit may have to be switched to a telephone visit.  With either a video or telephone visit, we are not always able to ensure that we have a secure connection.    ? ?I need to obtain your verbal consent now.   Are you willing to proceed with your visit today?  ?  ?Marillyn A Guarino has provided verbal consent on 12/13/2021 for a virtual visit (video or telephone). ?  ?Mar Daring, PA-C  ? ?Date: 12/13/2021 7:43 AM ? ? ?Virtual Visit via Video Note  ? ?Carmen Buchanan, connected with  JACQUELYN SHADRICK  ([id number], 05/27/1956) on 12/13/21 at  7:30 AM EDT by a video-enabled telemedicine application and verified that I am speaking with the correct person using two identifiers. ? ?Location: ?Patient: Virtual Visit Location Patient: Home ?Provider: Virtual Visit Location Provider: Home Office ?  ?I discussed the limitations of evaluation and management by  telemedicine and the availability of in person appointments. The patient expressed understanding and agreed to proceed.   ? ?History of Present Illness: ?Carmen Buchanan is a 66 y.o. who identifies as a female who was assigned female at birth, and is being seen today for nausea and abdominal cramping. ? ?HPI: Emesis  ?This is a new problem. The current episode started yesterday. The problem has been unchanged. There has been no fever. Associated symptoms include abdominal pain (and cramping), chills (mild), diarrhea and headaches. Pertinent negatives include no coughing, fever, myalgias, URI or weight loss. Associated symptoms comments: nausea. Risk factors include ill contacts. She has tried increased fluids and sleep for the symptoms. The treatment provided no relief.   ? ? ?Problems:  ?Patient Active Problem List  ? Diagnosis Date Noted  ? COVID-19 08/30/2021  ? Nausea 08/30/2021  ? Tachycardia 02/09/2021  ? History of anemia 02/09/2021  ? Advance care planning 11/12/2019  ? Routine general medical examination at a health care facility 04/30/2016  ? Renal stone 06/07/2013  ? Hand dermatitis 10/17/2012  ? Hemorrhoid 11/08/2011  ? S/P gastric bypass 07/28/2011  ? IRRITABLE BOWEL SYNDROME 12/10/2008  ? OBESITY 12/30/2007  ? Anxiety state 12/30/2007  ? MIGRAINE HEADACHE 12/30/2007  ? HYPERTENSION 12/30/2007  ?  ?Allergies:  ?Allergies  ?Allergen Reactions  ? Ciprofloxacin   ?  REACTION: nausea  ?  Latex   ? Morphine   ?  REACTION: HA and NAV, but tolerates hydrocodone  ? Nsaids   ?  Due to gastric bypass  ? ?Medications:  ?Current Outpatient Medications:  ?  dicyclomine (BENTYL) 10 MG capsule, Take 1 capsule (10 mg total) by mouth 4 (four) times daily -  before meals and at bedtime., Disp: 40 capsule, Rfl: 0 ?  buPROPion (WELLBUTRIN SR) 150 MG 12 hr tablet, TAKE 1 TABLET BY MOUTH TWICE A DAY, Disp: 180 tablet, Rfl: 3 ?  FLUoxetine (PROZAC) 40 MG capsule, TAKE 2 CAPSULES BY MOUTH DAILY, Disp: 180 capsule, Rfl: 3 ?   LORazepam (ATIVAN) 0.5 MG tablet, TAKE 1 TABLET (0.5 MG TOTAL) BY MOUTH DAILY AS NEEDED., Disp: 30 tablet, Rfl: 1 ?  omeprazole (PRILOSEC) 20 MG capsule, TAKE 1 CAPSULE BY MOUTH TWICE A DAY BEFORE A MEAL, Disp: 180 capsule, Rfl: 3 ?  polyethylene glycol (MIRALAX / GLYCOLAX) packet, Take 17 g by mouth daily., Disp: , Rfl:  ? ?Observations/Objective: ?Patient is well-developed, well-nourished in no acute distress.  ?Resting comfortably at home.  ?Head is normocephalic, atraumatic.  ?No labored breathing.  ?Speech is clear and coherent with logical content.  ?Patient is alert and oriented at baseline.  ? ? ?Assessment and Plan: ?1. Viral gastroenteritis ?- dicyclomine (BENTYL) 10 MG capsule; Take 1 capsule (10 mg total) by mouth 4 (four) times daily -  before meals and at bedtime.  Dispense: 40 capsule; Refill: 0 ? ?- Suspect viral GI infection ?- Clear liquids today, then bland diet tomorrow, then increase as tolerated ?- Push fluids, electrolyte drinks ?- Continue Zofran at home ?- Bentyl added for severe cramping (has h/o IBS and gastric bypass so infection has exacerbated) ?- Seek in person evaluation if not improving or if symptoms worsen ? ?Follow Up Instructions: ?I discussed the assessment and treatment plan with the patient. The patient was provided an opportunity to ask questions and all were answered. The patient agreed with the plan and demonstrated an understanding of the instructions.  A copy of instructions were sent to the patient via MyChart unless otherwise noted below.  ? ? ?The patient was advised to call back or seek an in-person evaluation if the symptoms worsen or if the condition fails to improve as anticipated. ? ?Time:  ?I spent 12 minutes with the patient via telehealth technology discussing the above problems/concerns.   ? ?Mar Daring, PA-C ?

## 2021-12-13 NOTE — Patient Instructions (Signed)
?Carmen Buchanan, thank you for joining Mar Daring, PA-C for today's virtual visit.  While this provider is not your primary care provider (PCP), if your PCP is located in our provider database this encounter information will be shared with them immediately following your visit. ? ?Consent: ?(Patient) Carmen Buchanan provided verbal consent for this virtual visit at the beginning of the encounter. ? ?Current Medications: ? ?Current Outpatient Medications:  ?  dicyclomine (BENTYL) 10 MG capsule, Take 1 capsule (10 mg total) by mouth 4 (four) times daily -  before meals and at bedtime., Disp: 40 capsule, Rfl: 0 ?  buPROPion (WELLBUTRIN SR) 150 MG 12 hr tablet, TAKE 1 TABLET BY MOUTH TWICE A DAY, Disp: 180 tablet, Rfl: 3 ?  FLUoxetine (PROZAC) 40 MG capsule, TAKE 2 CAPSULES BY MOUTH DAILY, Disp: 180 capsule, Rfl: 3 ?  LORazepam (ATIVAN) 0.5 MG tablet, TAKE 1 TABLET (0.5 MG TOTAL) BY MOUTH DAILY AS NEEDED., Disp: 30 tablet, Rfl: 1 ?  omeprazole (PRILOSEC) 20 MG capsule, TAKE 1 CAPSULE BY MOUTH TWICE A DAY BEFORE A MEAL, Disp: 180 capsule, Rfl: 3 ?  polyethylene glycol (MIRALAX / GLYCOLAX) packet, Take 17 g by mouth daily., Disp: , Rfl:   ? ?Medications ordered in this encounter:  ?Meds ordered this encounter  ?Medications  ? dicyclomine (BENTYL) 10 MG capsule  ?  Sig: Take 1 capsule (10 mg total) by mouth 4 (four) times daily -  before meals and at bedtime.  ?  Dispense:  40 capsule  ?  Refill:  0  ?  Order Specific Question:   Supervising Provider  ?  Answer:   Noemi Chapel [[id number]]  ?  ? ?*If you need refills on other medications prior to your next appointment, please contact your pharmacy* ? ?Follow-Up: ?Call back or seek an in-person evaluation if the symptoms worsen or if the condition fails to improve as anticipated. ? ?Other Instructions ? ?Viral Gastroenteritis, Adult ?Viral gastroenteritis is also known as the stomach flu. This condition may affect your stomach, small intestine, and large intestine. It  can cause sudden watery diarrhea, fever, and vomiting. This condition is caused by many different viruses. These viruses can be passed from person to person very easily (are contagious). ?Diarrhea and vomiting can make you feel weak and cause you to become dehydrated. You may not be able to keep fluids down. Dehydration can make you tired and thirsty, cause you to have a dry mouth, and decrease how often you urinate. It is important to replace the fluids that you lose from diarrhea and vomiting. ?What are the causes? ?Gastroenteritis is caused by many viruses, including rotavirus and norovirus. Norovirus is the most common cause in adults. You can get sick after being exposed to the viruses from other people. You can also get sick by: ?Eating food, drinking water, or touching a surface contaminated with one of these viruses. ?Sharing utensils or other personal items with an infected person. ?What increases the risk? ?You are more likely to develop this condition if you: ?Have a weak body defense system (immune system). ?Live with one or more children who are younger than 60 years old. ?Live in a nursing home. ?Travel on cruise ships. ?What are the signs or symptoms? ?Symptoms of this condition start suddenly 1-3 days after exposure to a virus. Symptoms may last for a few days or for as long as a week. Common symptoms include watery diarrhea and vomiting. Other symptoms include: ?Fever. ?Headache. ?Fatigue. ?Pain in  the abdomen. ?Chills. ?Weakness. ?Nausea. ?Muscle aches. ?Loss of appetite. ?How is this diagnosed? ?This condition is diagnosed with a medical history and physical exam. You may also have a stool test to check for viruses or other infections. ?How is this treated? ?This condition typically goes away on its own. The focus of treatment is to prevent dehydration and restore lost fluids (rehydration). This condition may be treated with: ?An oral rehydration solution (ORS) to replace important salts and  minerals (electrolytes) in your body. Take this if told by your health care provider. This is a drink that is sold at pharmacies and retail stores. ?Medicines to help with your symptoms. ?Probiotic supplements to reduce symptoms of diarrhea. ?Fluids given through an IV, if dehydration is severe. ?Older adults and people with other diseases or a weak immune system are at higher risk for dehydration. ?Follow these instructions at home: ?Eating and drinking ? ?Take an ORS as told by your health care provider. ?Drink clear fluids in small amounts as you are able. Clear fluids include: ?Water. ?Ice chips. ?Diluted fruit juice. ?Low-calorie sports drinks. ?Drink enough fluid to keep your urine pale yellow. ?Eat small amounts of healthy foods every 3-4 hours as you are able. This may include whole grains, fruits, vegetables, lean meats, and yogurt. ?Avoid fluids that contain a lot of sugar or caffeine, such as energy drinks, sports drinks, and soda. ?Avoid spicy or fatty foods. ?Avoid alcohol. ?General instructions ?Wash your hands often, especially after having diarrhea or vomiting. If soap and water are not available, use hand sanitizer. ?Make sure that all people in your household wash their hands well and often. ?Take over-the-counter and prescription medicines only as told by your health care provider. ?Rest at home while you recover. ?Watch your condition for any changes. ?Take a warm bath to relieve any burning or pain from frequent diarrhea episodes. ?Keep all follow-up visits as told by your health care provider. This is important. ?Contact a health care provider if you: ?Cannot keep fluids down. ?Have symptoms that get worse. ?Have new symptoms. ?Feel light-headed or dizzy. ?Have muscle cramps. ?Get help right away if you: ?Have chest pain. ?Feel extremely weak or you faint. ?See blood in your vomit. ?Have vomit that looks like coffee grounds. ?Have bloody or black stools or stools that look like tar. ?Have a  severe headache, a stiff neck, or both. ?Have a rash. ?Have severe pain, cramping, or bloating in your abdomen. ?Have trouble breathing or you are breathing very quickly. ?Have a fast heartbeat. ?Have skin that feels cold and clammy. ?Feel confused. ?Have pain when you urinate. ?Have signs of dehydration, such as: ?Dark urine, very little urine, or no urine. ?Cracked lips. ?Dry mouth. ?Sunken eyes. ?Sleepiness. ?Weakness. ?Summary ?Viral gastroenteritis is also known as the stomach flu. It can cause sudden watery diarrhea, fever, and vomiting. ?This condition can be passed from person to person very easily (is contagious). ?Take an ORS if told by your health care provider. This is a drink that is sold at pharmacies and retail stores. ?Wash your hands often, especially after having diarrhea or vomiting. If soap and water are not available, use hand sanitizer. ?This information is not intended to replace advice given to you by your health care provider. Make sure you discuss any questions you have with your health care provider. ?Document Revised: 02/28/2019 Document Reviewed: 07/17/2018 ?Elsevier Patient Education ? 2022 [employer]. ? ? ? ?If you have been instructed to have an in-person evaluation today  at a local Urgent Care facility, please use the link below. It will take you to a list of all of our available [facility], including address, phone number and hours of operation. Please do not delay care.  ?[facility] ? ?If you or a family member do not have a primary care provider, use the link below to schedule a visit and establish care. When you choose a [facility] primary care physician or advanced practice provider, you gain a long-term partner in health. ?Find a Primary Care Provider ? ?Learn more about [facility]'s in-office and virtual care options: ?[facility] ?

## 2021-12-14 ENCOUNTER — Telehealth: Payer: 59 | Admitting: Family

## 2021-12-14 ENCOUNTER — Encounter: Payer: Self-pay | Admitting: Family

## 2021-12-14 DIAGNOSIS — A084 Viral intestinal infection, unspecified: Secondary | ICD-10-CM | POA: Diagnosis not present

## 2021-12-14 NOTE — Progress Notes (Signed)
?Virtual Visit Consent  ? ?Carmen Buchanan, you are scheduled for a virtual visit with a [facility] provider today.   ?  ?Just as with appointments in the office, your consent must be obtained to participate.  Your consent will be active for this visit and any virtual visit you may have with one of our providers in the next 365 days.   ?  ?If you have a MyChart account, a copy of this consent can be sent to you electronically.  All virtual visits are billed to your insurance company just like a traditional visit in the office.   ? ?As this is a virtual visit, video technology does not allow for your provider to perform a traditional examination.  This may limit your provider's ability to fully assess your condition.  If your provider identifies any concerns that need to be evaluated in person or the need to arrange testing (such as labs, EKG, etc.), we will make arrangements to do so.   ?  ?Although advances in technology are sophisticated, we cannot ensure that it will always work on either your end or our end.  If the connection with a video visit is poor, the visit may have to be switched to a telephone visit.  With either a video or telephone visit, we are not always able to ensure that we have a secure connection.    ? ?I need to obtain your verbal consent now.   Are you willing to proceed with your visit today?  ?  ?Carmen Buchanan has provided verbal consent on 12/14/2021 for a virtual visit (video or telephone). ?  ?Carmen Dun, FNP  ? ?Date: 12/14/2021 9:00 AM ? ? ?Virtual Visit via Video Note  ? ?Carmen Buchanan, connected with  Carmen Buchanan  ([id number], 1956-07-04) on 12/14/21 at  8:45 AM EDT by a video-enabled telemedicine application and verified that I am speaking with the correct person using two identifiers. ? ?Location: ?Patient: Virtual Visit Location Patient: Home ?Provider: Virtual Visit Location Provider: Home Office ?  ?I discussed the limitations of evaluation and management by telemedicine  and the availability of in person appointments. The patient expressed understanding and agreed to proceed.   ? ?History of Present Illness: ?Carmen Buchanan is a 66 y.o. who identifies as a female who was assigned female at birth, and is being seen today for diarrhea. She had a video visit and diagnosed with viral gastroenteritis yesterday. Told to continue her Zofran and given bentyl for stomach cramps.  ? ?She reports she has continued to have diarrhea. She is unsure if she could take the zofran and Bentyl together. She has hemorrhoids and is worried about taking Imodium.  ? ? ?HPI: Diarrhea  ?This is a new problem. The current episode started in the past 7 days. The problem occurs 5 to 10 times per day. The problem has been waxing and waning. The stool consistency is described as Watery. Associated symptoms include abdominal pain, bloating, headaches and increased flatus. Pertinent negatives include no chills, coughing, fever or vomiting. She has tried change of diet and increased fluids for the symptoms. The treatment provided mild relief.   ?Problems:  ?Patient Active Problem List  ? Diagnosis Date Noted  ? COVID-19 08/30/2021  ? Nausea 08/30/2021  ? Tachycardia 02/09/2021  ? History of anemia 02/09/2021  ? Advance care planning 11/12/2019  ? Routine general medical examination at a health care facility 04/30/2016  ? Renal stone 06/07/2013  ? Hand  dermatitis 10/17/2012  ? Hemorrhoid 11/08/2011  ? S/P gastric bypass 07/28/2011  ? IRRITABLE BOWEL SYNDROME 12/10/2008  ? OBESITY 12/30/2007  ? Anxiety state 12/30/2007  ? MIGRAINE HEADACHE 12/30/2007  ? HYPERTENSION 12/30/2007  ?  ?Allergies:  ?Allergies  ?Allergen Reactions  ? Ciprofloxacin   ?  REACTION: nausea  ? Latex   ? Morphine   ?  REACTION: HA and NAV, but tolerates hydrocodone  ? Nsaids   ?  Due to gastric bypass  ? ?Medications:  ?Current Outpatient Medications:  ?  buPROPion (WELLBUTRIN SR) 150 MG 12 hr tablet, TAKE 1 TABLET BY MOUTH TWICE A DAY, Disp: 180  tablet, Rfl: 3 ?  dicyclomine (BENTYL) 10 MG capsule, Take 1 capsule (10 mg total) by mouth 4 (four) times daily -  before meals and at bedtime., Disp: 40 capsule, Rfl: 0 ?  FLUoxetine (PROZAC) 40 MG capsule, TAKE 2 CAPSULES BY MOUTH DAILY, Disp: 180 capsule, Rfl: 3 ?  LORazepam (ATIVAN) 0.5 MG tablet, TAKE 1 TABLET (0.5 MG TOTAL) BY MOUTH DAILY AS NEEDED., Disp: 30 tablet, Rfl: 1 ?  omeprazole (PRILOSEC) 20 MG capsule, TAKE 1 CAPSULE BY MOUTH TWICE A DAY BEFORE A MEAL, Disp: 180 capsule, Rfl: 3 ?  polyethylene glycol (MIRALAX / GLYCOLAX) packet, Take 17 g by mouth daily., Disp: , Rfl:  ? ?Observations/Objective: ?Patient is well-developed, well-nourished in no acute distress.  ?Resting comfortably  at home.  ?Head is normocephalic, atraumatic.  ?No labored breathing.  ?Speech is clear and coherent with logical content.  ?Patient is alert and oriented at baseline.  ? ? ?Assessment and Plan: ?1. Viral gastroenteritis ? ?Reassurance given. She can take zofran and bentyl together.  ?Force fluids ?Encouraged Imodium  ?BRAT diet ?If symptoms worsen or do not improve over next 1-2 days will need to be seen in person ? ?Follow Up Instructions: ?I discussed the assessment and treatment plan with the patient. The patient was provided an opportunity to ask questions and all were answered. The patient agreed with the plan and demonstrated an understanding of the instructions.  A copy of instructions were sent to the patient via MyChart unless otherwise noted below.  ? ? ? ?The patient was advised to call back or seek an in-person evaluation if the symptoms worsen or if the condition fails to improve as anticipated. ? ?Time:  ?I spent 12 minutes with the patient via telehealth technology discussing the above problems/concerns.   ? ?Carmen Dun, FNP ? ?

## 2022-01-28 ENCOUNTER — Other Ambulatory Visit: Payer: Self-pay | Admitting: Family Medicine

## 2022-02-07 ENCOUNTER — Other Ambulatory Visit: Payer: Self-pay | Admitting: Family Medicine

## 2022-02-08 ENCOUNTER — Telehealth: Payer: Self-pay

## 2022-02-08 NOTE — Telephone Encounter (Signed)
PA has been started in [email or web address] ?

## 2022-02-08 NOTE — Telephone Encounter (Signed)
Refill request for LORAZEPAM 0.5 MG TABLET ? ?LOV - 08/30/21 VV with Romilda Garret, NP ?Next OV - not scheduled ?Last refill - 12/05/20 #30/1 ? ?

## 2022-02-08 NOTE — Telephone Encounter (Signed)
PA has been started in [email or web address] for Omeprazole 20 mg caps. KEY: [id number]; awaiting determination.  ?

## 2022-02-09 NOTE — Telephone Encounter (Signed)
PA was approved from 02/08/22 to 02/06/2023.

## 2022-02-10 NOTE — Telephone Encounter (Signed)
Received approval for Omeprazole 20 MG capsule delayed release.  Please see below.  We are pleased to inform you that your request for coverage has been approved from 02/08/2022 through 02/09/2023.

## 2022-02-28 ENCOUNTER — Encounter (HOSPITAL_BASED_OUTPATIENT_CLINIC_OR_DEPARTMENT_OTHER): Payer: Self-pay

## 2022-02-28 ENCOUNTER — Other Ambulatory Visit: Payer: Self-pay

## 2022-02-28 ENCOUNTER — Emergency Department (HOSPITAL_BASED_OUTPATIENT_CLINIC_OR_DEPARTMENT_OTHER): Payer: 59

## 2022-02-28 ENCOUNTER — Emergency Department (HOSPITAL_BASED_OUTPATIENT_CLINIC_OR_DEPARTMENT_OTHER)
Admission: EM | Admit: 2022-02-28 | Discharge: 2022-02-28 | Disposition: A | Discharge status: Home / Self Care | Payer: 59 | Attending: Emergency Medicine | Admitting: Emergency Medicine

## 2022-02-28 DIAGNOSIS — N183 Chronic kidney disease, stage 3 unspecified: Secondary | ICD-10-CM | POA: Insufficient documentation

## 2022-02-28 DIAGNOSIS — R110 Nausea: Secondary | ICD-10-CM | POA: Insufficient documentation

## 2022-02-28 DIAGNOSIS — R1013 Epigastric pain: Secondary | ICD-10-CM | POA: Insufficient documentation

## 2022-02-28 DIAGNOSIS — R109 Unspecified abdominal pain: Secondary | ICD-10-CM

## 2022-02-28 DIAGNOSIS — Z91040 Latex allergy status: Secondary | ICD-10-CM | POA: Diagnosis not present

## 2022-02-28 LAB — COMPREHENSIVE METABOLIC PANEL WITH GFR
ALT: 16 U/L (ref 0–44)
AST: 21 U/L (ref 15–41)
Albumin: 3.5 g/dL (ref 3.5–5.0)
Alkaline Phosphatase: 104 U/L (ref 38–126)
Anion gap: 6 (ref 5–15)
BUN: 32 mg/dL — ABNORMAL HIGH (ref 8–23)
CO2: 24 mmol/L (ref 22–32)
Calcium: 8.9 mg/dL (ref 8.9–10.3)
Chloride: 105 mmol/L (ref 98–111)
Creatinine, Ser: 1.75 mg/dL — ABNORMAL HIGH (ref 0.44–1.00)
GFR, Estimated: 32 mL/min — ABNORMAL LOW (ref >60)
Glucose, Bld: 99 mg/dL (ref 70–99)
Potassium: 4.4 mmol/L (ref 3.5–5.1)
Sodium: 135 mmol/L (ref 135–145)
Total Bilirubin: 0.6 mg/dL (ref 0.3–1.2)
Total Protein: 7.3 g/dL (ref 6.5–8.1)

## 2022-02-28 LAB — URINALYSIS, MICROSCOPIC (REFLEX)

## 2022-02-28 LAB — CBC WITH DIFFERENTIAL/PLATELET
Abs Immature Granulocytes: 0.04 10*3/uL (ref 0.00–0.07)
Basophils Absolute: 0 10*3/uL (ref 0.0–0.1)
Basophils Relative: 0 %
Eosinophils Absolute: 0.1 10*3/uL (ref 0.0–0.5)
Eosinophils Relative: 1 %
HCT: 39.1 % (ref 36.0–46.0)
Hemoglobin: 12.6 g/dL (ref 12.0–15.0)
Immature Granulocytes: 0 %
Lymphocytes Relative: 16 %
Lymphs Abs: 1.5 10*3/uL (ref 0.7–4.0)
MCH: 28.7 pg (ref 26.0–34.0)
MCHC: 32.2 g/dL (ref 30.0–36.0)
MCV: 89.1 fL (ref 80.0–100.0)
Monocytes Absolute: 0.6 10*3/uL (ref 0.1–1.0)
Monocytes Relative: 7 %
Neutro Abs: 7.2 10*3/uL (ref 1.7–7.7)
Neutrophils Relative %: 76 %
Platelets: 355 10*3/uL (ref 150–400)
RBC: 4.39 MIL/uL (ref 3.87–5.11)
RDW: 13.9 % (ref 11.5–15.5)
WBC: 9.5 10*3/uL (ref 4.0–10.5)
nRBC: 0 % (ref 0.0–0.2)

## 2022-02-28 LAB — LIPASE, BLOOD: Lipase: 33 U/L (ref 11–51)

## 2022-02-28 LAB — URINALYSIS, ROUTINE W REFLEX MICROSCOPIC
Bilirubin Urine: NEGATIVE
Glucose, UA: NEGATIVE mg/dL
Ketones, ur: NEGATIVE mg/dL
Leukocytes,Ua: NEGATIVE
Nitrite: NEGATIVE
Protein, ur: NEGATIVE mg/dL
Specific Gravity, Urine: 1.02 (ref 1.005–1.030)
pH: 5.5 (ref 5.0–8.0)

## 2022-02-28 LAB — TROPONIN I (HIGH SENSITIVITY): Troponin I (High Sensitivity): 2 ng/L (ref <18)

## 2022-02-28 MED ORDER — SODIUM CHLORIDE 0.9 % IV BOLUS
1000.0000 mL | Freq: Once | INTRAVENOUS | Status: AC
  Administered 2022-02-28: 1000 mL via INTRAVENOUS

## 2022-02-28 MED ORDER — ALUM & MAG HYDROXIDE-SIMETH 200-200-20 MG/5ML PO SUSP
30.0000 mL | Freq: Once | ORAL | Status: AC
  Administered 2022-02-28: 30 mL via ORAL
  Filled 2022-02-28: qty 30

## 2022-02-28 MED ORDER — PANTOPRAZOLE SODIUM 20 MG PO TBEC: 40.0000 mg | DELAYED_RELEASE_TABLET | Freq: Every day | ORAL | 0 refills | Status: DC

## 2022-02-28 MED ORDER — ONDANSETRON HCL 4 MG/2ML IJ SOLN
4.0000 mg | Freq: Once | INTRAMUSCULAR | Status: AC
  Administered 2022-02-28: 4 mg via INTRAVENOUS
  Filled 2022-02-28: qty 2

## 2022-02-28 MED ORDER — FENTANYL CITRATE PF 50 MCG/ML IJ SOSY
50.0000 ug | PREFILLED_SYRINGE | Freq: Once | INTRAMUSCULAR | Status: AC
  Administered 2022-02-28: 50 ug via INTRAVENOUS
  Filled 2022-02-28: qty 1

## 2022-02-28 MED ORDER — LIDOCAINE VISCOUS HCL 2 % MT SOLN
15.0000 mL | Freq: Once | OROMUCOSAL | Status: AC
  Administered 2022-02-28: 15 mL via ORAL
  Filled 2022-02-28: qty 15

## 2022-02-28 NOTE — ED Provider Notes (Signed)
[facility] Provider Note   CSN: [id number] Arrival date & time: 02/28/22  1005     History  Chief Complaint  Patient presents with   Flank Pain    Carmen Buchanan is a 66 y.o. female with a past medical history of stage III CKD (creatinine of 1.7 on blood work last week), kidney stones presenting to the ED with a chief complaint of abdominal pain and flank pain.  Symptoms have been going on since yesterday.  Reports pain in bilateral kidney area as well as the abdomen.  She states that this does feel similar to her history of kidney stones in the past.  She reports associated nausea.  Denies any changes to bowel movements as she takes MiraLAX on a daily basis.  Denies any dysuria, hematuria, vaginal discharge, abnormal vaginal bleeding, fever but does report chills.  No chest pain or shortness of breath.  Prior abdominal surgeries include gastric bypass surgery and nephrolithotomy.   Flank Pain Associated symptoms include abdominal pain. Pertinent negatives include no chest pain and no shortness of breath.      Home Medications Prior to Admission medications   Medication Sig Start Date End Date Taking? Authorizing Provider  pantoprazole (PROTONIX) 20 MG tablet Take 2 tablets (40 mg total) by mouth daily for 14 days. 02/28/22 03/14/22 Yes Schlossman, Junie Panning, MD  buPROPion [facility] SR) 150 MG 12 hr tablet TAKE 1 TABLET BY MOUTH TWICE A DAY 01/30/22   Tonia Ghent, MD  dicyclomine (BENTYL) 10 MG capsule Take 1 capsule (10 mg total) by mouth 4 (four) times daily -  before meals and at bedtime. 12/13/21   Mar Daring, PA-C  FLUoxetine (PROZAC) 40 MG capsule TAKE 2 CAPSULES BY MOUTH EVERY DAY 01/30/22   Tonia Ghent, MD  LORazepam (ATIVAN) 0.5 MG tablet TAKE 1 TABLET BY MOUTH DAILY AS NEEDED. 02/08/22   Tonia Ghent, MD  omeprazole (PRILOSEC) 20 MG capsule TAKE 1 CAPSULE BY MOUTH TWICE A DAY BEFORE A MEAL 11/09/21   Tonia Ghent, MD   polyethylene glycol (MIRALAX / Floria Raveling) packet Take 17 g by mouth daily.    [provider]      Allergies    Ciprofloxacin, Latex, Morphine, and Nsaids    Review of Systems   Review of Systems  Constitutional:  Negative for appetite change, chills and fever.  HENT:  Negative for ear pain, rhinorrhea, sneezing and sore throat.   Eyes:  Negative for photophobia and visual disturbance.  Respiratory:  Negative for cough, chest tightness, shortness of breath and wheezing.   Cardiovascular:  Negative for chest pain and palpitations.  Gastrointestinal:  Positive for abdominal pain and nausea. Negative for blood in stool, constipation, diarrhea and vomiting.  Genitourinary:  Positive for flank pain. Negative for dysuria, hematuria and urgency.  Musculoskeletal:  Negative for myalgias.  Skin:  Negative for rash.  Neurological:  Negative for dizziness, weakness and light-headedness.   Physical Exam Updated Vital Signs BP 132/70   Pulse 63   Temp 98.2 F (36.8 C) (Oral)   Resp 16   Ht '5\' 8"'$  (1.727 m)   Wt 106.6 kg   SpO2 97%   BMI 35.73 kg/m  Physical Exam Vitals and nursing note reviewed.  Constitutional:      General: She is not in acute distress.    Appearance: She is well-developed.  HENT:     Head: Normocephalic and atraumatic.     Nose: Nose normal.  Eyes:     General: No scleral icterus.       Left eye: No discharge.     Conjunctiva/sclera: Conjunctivae normal.  Cardiovascular:     Rate and Rhythm: Normal rate and regular rhythm.     Heart sounds: Normal heart sounds. No murmur heard.   No friction rub. No gallop.  Pulmonary:     Effort: Pulmonary effort is normal. No respiratory distress.     Breath sounds: Normal breath sounds.  Abdominal:     General: Bowel sounds are normal. There is no distension.     Palpations: Abdomen is soft.     Tenderness: There is abdominal tenderness (generalized). There is right CVA tenderness and left CVA tenderness. There  is no guarding.  Musculoskeletal:        General: Normal range of motion.     Cervical back: Normal range of motion and neck supple.  Skin:    General: Skin is warm and dry.     Findings: No rash.  Neurological:     Mental Status: She is alert.     Motor: No abnormal muscle tone.     Coordination: Coordination normal.    ED Results / Procedures / Treatments   Labs (all labs ordered are listed, but only abnormal results are displayed) Labs Reviewed  URINALYSIS, ROUTINE W REFLEX MICROSCOPIC - Abnormal; Notable for the following components:      Result Value   Hgb urine dipstick TRACE (*)    All other components within normal limits  COMPREHENSIVE METABOLIC PANEL - Abnormal; Notable for the following components:   BUN 32 (*)    Creatinine, Ser 1.75 (*)    GFR, Estimated 32 (*)    All other components within normal limits  URINALYSIS, MICROSCOPIC (REFLEX) - Abnormal; Notable for the following components:   Bacteria, UA RARE (*)    All other components within normal limits  URINE CULTURE  CBC WITH DIFFERENTIAL/PLATELET  LIPASE, BLOOD  TROPONIN I (HIGH SENSITIVITY)    EKG None  Radiology CT Renal Stone Study  Result Date: 02/28/2022 CLINICAL DATA:  Flank pain, kidney stone suspected.  Stage III CKD. EXAM: CT ABDOMEN AND PELVIS WITHOUT CONTRAST TECHNIQUE: Multidetector CT imaging of the abdomen and pelvis was performed following the standard protocol without IV contrast. RADIATION DOSE REDUCTION: This exam was performed according to the departmental dose-optimization program which includes automated exposure control, adjustment of the mA and/or kV according to patient size and/or use of iterative reconstruction technique. COMPARISON:  Sonogram dated April 07, 2021 FINDINGS: Lower chest: No acute abnormality. Hepatobiliary: No focal liver abnormality is seen. No gallstones, gallbladder wall thickening, or biliary dilatation. Pancreas: Mild fatty infiltration. No pancreatic ductal  dilatation or surrounding inflammatory changes. Spleen: Normal in size without focal abnormality. Adrenals/Urinary Tract: 2 cm left adrenal nodule likely representing adrenal adenoma, not significantly changed. No follow-up is recommended. Right adrenal is unremarkable. Kidneys are normal, without renal calculi, focal lesion, or hydronephrosis. Bladder is unremarkable. Stomach/Bowel: Status post gastric bypass surgery. Bowel loops are normal in caliber. Appendix appears normal. No evidence of bowel wall thickening, distention, or inflammatory changes. Vascular/Lymphatic: No significant vascular findings are present. No enlarged abdominal or pelvic lymph nodes. Reproductive: Calcified degenerated fibroid measuring up to 4.5 cm, unchanged. No adnexal mass. Other: No abdominal wall hernia or abnormality. No abdominopelvic ascites. Musculoskeletal: Multilevel degenerate disc disease with osseous fusion of L4 and L5 vertebral bodies. Acute osseous abnormality. IMPRESSION: 1. No evidence of nephrolithiasis or hydronephrosis. Urinary bladder  is unremarkable. 2. Left adrenal nodule measuring up to 2 cm, most consistent with adrenal adenoma, not significantly changed, and does not require follow-up. 3. Calcified fibroid in the uterus measuring up to 4.5 cm, unchanged. 4. Moderate multilevel degenerate disc disease of the lumbar spine. No acute osseous abnormality. Electronically Signed   By: Keane Police D.O.   On: 02/28/2022 12:00    Procedures Procedures    Medications Ordered in ED Medications  ondansetron (ZOFRAN) injection 4 mg (4 mg Intravenous Given 02/28/22 1104)  sodium chloride 0.9 % bolus 1,000 mL (0 mLs Intravenous Stopped 02/28/22 1235)  fentaNYL (SUBLIMAZE) injection 50 mcg (50 mcg Intravenous Given 02/28/22 1104)  alum & mag hydroxide-simeth (MAALOX/MYLANTA) 200-200-20 MG/5ML suspension 30 mL (30 mLs Oral Given 02/28/22 1316)    And  lidocaine (XYLOCAINE) 2 % viscous mouth solution 15 mL (15 mLs Oral  Given 02/28/22 1316)    ED Course/ Medical Decision Making/ A&P Clinical Course as of 02/28/22 1406  Tue Feb 28, 2022  1205 Creatinine(!): 1.75 Baseline. [HK]    Clinical Course User Index [HK] Delia Heady, PA-C                           Medical Decision Making Amount and/or Complexity of Data Reviewed Labs: ordered. Decision-making details documented in ED Course. Radiology: ordered.  Risk OTC drugs. Prescription drug management.   66 year old female with past medical history of kidney stones, stage III CKD with a creatinine of 1.7 presenting to the ED with a chief complaint of bilateral flank pain and abdominal pain.  Symptoms began yesterday and worsened today.  Reports associated nausea.  No urinary symptoms or changes to bowel movements.  No vomiting.  She has a temperature of 99 here and reports chills.  On exam abdomen is generally tender mostly in the middle of the abdomen as well as bilateral CVA tenderness.  No rebound or guarding noted.  Denies any chest pain, shortness of breath, cough or other URI symptoms.  Will obtain labs, urinalysis, urine culture and reassess.  Urinalysis with some rare bacteria but otherwise unremarkable.  CMP with creatinine of 1.75 which is similar to her prior value last week and is likely her baseline consistent with her CKD.  CBC and lipase unremarkable.  Urine is sent for culture.  CT renal stone study shows no evidence of kidney stone, hydro nephrosis or other abnormalities with the kidneys.  She does have an adrenal adenoma which is unchanged from prior, degenerative disc disease in the lumbar spine and a calcified fibroid in the uterus which is unchanged as well.  Patient reports some improvement in her symptoms with medication given.  Will obtain EKG to rule out cardiac cause of her symptoms as this could be atypical.  EKG without any evidence of STEMI or ischemic changes.  Troponin is negative x1.  I doubt that her symptoms are due to ACS.   Suspect reflux as she recently stopped taking her reflux medication versus musculoskeletal pain.  She remains hemodynamically stable here.  We will have her follow-up with PCP and return for any worsening symptoms. Patient discussed with and seen by the attending, Dr. Billy Fischer  Patient is hemodynamically stable, in NAD, and able to ambulate in the ED. Evaluation does not show pathology that would require ongoing emergent intervention or inpatient treatment. I explained the diagnosis to the patient. Pain has been managed and has no complaints prior to discharge. Patient is comfortable with  above plan and is stable for discharge at this time. All questions were answered prior to disposition. Strict return precautions for returning to the ED were discussed. Encouraged follow up with PCP.   An After Visit Summary was printed and given to the patient.   Portions of this note were generated with [employer]. Dictation errors may occur despite best attempts at proofreading.        Final Clinical Impression(s) / ED Diagnoses Final diagnoses:  Epigastric pain  Flank pain    Rx / DC Orders ED Discharge Orders          Ordered    pantoprazole (PROTONIX) 20 MG tablet  Daily        02/28/22 1400              Delia Heady, PA-C 02/28/22 1406    Gareth Morgan, MD 02/28/22 2213

## 2022-02-28 NOTE — ED Triage Notes (Signed)
C/o bilateral abdominal/flank pain. Hx of kidney stones and stage 3 CKD. Nausea

## 2022-03-01 LAB — URINE CULTURE: Culture: 40000 — AB

## 2022-03-02 ENCOUNTER — Telehealth: Payer: Self-pay

## 2022-03-02 NOTE — Telephone Encounter (Signed)
Post ED Visit - Positive Culture Follow-up: Unsuccessful Patient Follow-up  Culture assessed and recommendations reviewed by:  '[x]'$  Sherlon Handing, Pharm.D. '[]'$  Heide Guile, Pharm.D., BCPS AQ-ID '[]'$  Parks Neptune, Pharm.D., BCPS '[]'$  Alycia Rossetti, Pharm.D., BCPS '[]'$  [place], Pharm.D., BCPS, AAHIVP '[]'$  Legrand Como, Pharm.D., BCPS, AAHIVP '[]'$  Wynell Balloon, PharmD '[]'$  Vincenza Hews, PharmD, BCPS  Positive urine culture  '[x]'$  Patient discharged without antimicrobial prescription and treatment is now indicated '[]'$  Organism is resistant to prescribed ED discharge antimicrobial '[]'$  Patient with positive blood cultures   Plan: call pt, if still having abd/flank pain recommend f/u with PCP for repeat urine culture per ED provider Sherrell Puller, PA-C  Unable to contact patient after multiple attempts, letter will be sent to address on file  Glennon Hamilton 03/02/2022, 10:26 AM

## 2022-03-02 NOTE — Progress Notes (Signed)
ED Antimicrobial Stewardship Positive Culture Follow Up   Carmen Buchanan is an 66 y.o. female who presented to [facility] on 02/28/2022 with a chief complaint of  Chief Complaint  Patient presents with   Flank Pain    Recent Results (from the past 720 hour(s))  Urine Culture     Status: Abnormal   Collection Time: 02/28/22 10:29 AM   Specimen: Urine, Clean Catch  Result Value Ref Range Status   Specimen Description   Final    URINE, CLEAN CATCH Performed at [facility], [address] [place], [place] [address]    Special Requests   Final    NONE Performed at [facility], [address] [place], [place] [address]    Culture (A)  Final    40,000 COLONIES/mL STREPTOCOCCUS GALLOLYTICUS Standardized susceptibility testing for this organism is not available. Performed at [facility], [place] [address] [place], [place] [address]    Report Status 03/01/2022 FINAL  Final     '[x]'$  Patient discharged originally without antimicrobial agent   Plan: PPRN will call patient and if still having flank/abd pain or any urinary tract symptoms, will instruct pt to f/u with PCP for repeat urine culture  ED Provider: Sherrell Puller, PA-C   Carmen Buchanan 03/02/2022, 9:00 AM Clinical Pharmacist Monday - Friday phone -  [phone] Saturday - Sunday phone - [phone]

## 2022-03-06 ENCOUNTER — Encounter: Payer: Self-pay | Admitting: Family Medicine

## 2022-03-06 ENCOUNTER — Ambulatory Visit (INDEPENDENT_AMBULATORY_CARE_PROVIDER_SITE_OTHER): Payer: 59 | Admitting: Family Medicine

## 2022-03-06 VITALS — BP 124/80 | HR 70 | Temp 98.4°F | Ht 68.0 in | Wt 234.0 lb

## 2022-03-06 DIAGNOSIS — R109 Unspecified abdominal pain: Secondary | ICD-10-CM | POA: Diagnosis not present

## 2022-03-06 MED ORDER — SUCRALFATE 1 G PO TABS: 1.0000 g | ORAL_TABLET | Freq: Three times a day (TID) | ORAL | 0 refills | Status: DC

## 2022-03-06 NOTE — Patient Instructions (Addendum)
Go to the lab on the way out.   If you have mychart we'll likely use that to update you.    Take carafate for next few days and let me if that helps or not.  Keep taking omeprazole twice a day for now.   Take care.  Glad to see you.  If not better, then we need to consider GI eval vs ultrasound.

## 2022-03-06 NOTE — Progress Notes (Signed)
UC eval on 02/28/22.  Started with sx on 02/27/22.  Central abd pain that radiated out to the periphery of the abdomen/around the abdomen.  Tylenol helped a little.  Felt a little better AM 02/28/22, then had more pain as the day went on.  Had to leave work.  UC eval d/w pt.  Not having abd pain now but "my stomach doesn't feel right."  She is having some B lower back pain, laterally.    No vomiting except for 1 episode after eating quickly last week, did have some nausea last week.  No diarrhea.  No bloody or black stools.  Still having normal BMs with miralax use.    Patient doesn't note consistent sx change with eating.    FH gastric ulcers noted, mother and father.  She thought that TUMS helped some.  Prev UCX d/w pt.  No burning with urination.    She couldn't get PPI filled for BID use.  Insurance wouldn't cover BID dosing.  She has been off omeprazole for about 10 days.    Recent imaging discussed with patient.  IMPRESSION: 1. No evidence of nephrolithiasis or hydronephrosis. Urinary bladder is unremarkable.   2. Left adrenal nodule measuring up to 2 cm, most consistent with adrenal adenoma, not significantly changed, and does not require follow-up.   3. Calcified fibroid in the uterus measuring up to 4.5 cm, unchanged.   4. Moderate multilevel degenerate disc disease of the lumbar spine. No acute osseous abnormality.  Meds, vitals, and allergies reviewed.   ROS: Per HPI unless specifically indicated in ROS section   GEN: nad, alert and oriented HEENT: ncat NECK: supple w/o LA CV: rrr. PULM: ctab, no inc wob ABD: soft, +bs EXT: no edema SKIN: no acute rash Back is nontender to palpation in midline lower and paraspinal muscles at time of exam.  30 minutes were devoted to patient care in this encounter (this includes time spent reviewing the patient's file/history, interviewing and examining the patient, counseling/reviewing plan with patient).

## 2022-03-07 LAB — URINALYSIS, ROUTINE W REFLEX MICROSCOPIC
Bilirubin Urine: NEGATIVE
Hgb urine dipstick: NEGATIVE
Ketones, ur: NEGATIVE
Leukocytes,Ua: NEGATIVE
Nitrite: NEGATIVE
RBC / HPF: NONE SEEN (ref 0–?)
Specific Gravity, Urine: 1.02 (ref 1.000–1.030)
Total Protein, Urine: NEGATIVE
Urine Glucose: NEGATIVE
Urobilinogen, UA: 0.2 (ref 0.0–1.0)
pH: 6 (ref 5.0–8.0)

## 2022-03-08 DIAGNOSIS — R109 Unspecified abdominal pain: Secondary | ICD-10-CM | POA: Insufficient documentation

## 2022-03-08 LAB — URINE CULTURE
MICRO NUMBER:: 13513241
SPECIMEN QUALITY:: ADEQUATE

## 2022-03-08 NOTE — Assessment & Plan Note (Signed)
No pain at time of exam.  Still okay for outpatient follow-up. Discussed options, some of her symptoms could be GERD related.  Reasonable to take carafate for next few days and she can let me if that helps or not.  Reasonable to keep taking omeprazole twice a day for now.   See notes on labs.   If not better, then we need to consider GI eval vs ultrasound.

## 2022-04-03 ENCOUNTER — Encounter: Payer: Self-pay | Admitting: Family Medicine

## 2022-04-24 ENCOUNTER — Ambulatory Visit (INDEPENDENT_AMBULATORY_CARE_PROVIDER_SITE_OTHER): Payer: 59 | Admitting: Family Medicine

## 2022-04-24 ENCOUNTER — Encounter: Payer: Self-pay | Admitting: Family Medicine

## 2022-04-24 DIAGNOSIS — R109 Unspecified abdominal pain: Secondary | ICD-10-CM | POA: Diagnosis not present

## 2022-04-24 DIAGNOSIS — R7989 Other specified abnormal findings of blood chemistry: Secondary | ICD-10-CM | POA: Diagnosis not present

## 2022-04-24 MED ORDER — LORAZEPAM 0.5 MG PO TABS: 0.5000 mg | ORAL_TABLET | Freq: Every day | ORAL | 1 refills | Status: DC | PRN

## 2022-04-24 NOTE — Progress Notes (Unsigned)
D/w pt about CKD with renal follow up.  D/w pt about avoiding NSAIDS and limiting salt and sugar.  She'll have renal follow up.   She had back pain with imaging noted: Moderate multilevel degenerate disc disease of the lumbar spine.   GI sx d/w pt.  She tried adding on carafate.  She improved with use.  No sig pain in the meantime.  We talked about stressors in the meantime.  We agreed to hold off with GI eval and RUQ u/s since she was better in the meantime.    No blood in stool, not vomiting blood.    Meds, vitals, and allergies reviewed.   ROS: Per HPI unless specifically indicated in ROS section   GEN: nad, alert and oriented HEENT: ncat NECK: supple w/o LA CV: rrr PULM: ctab, no inc wob ABD: soft, +bs EXT: no edema SKIN: no acute rash

## 2022-04-24 NOTE — Patient Instructions (Signed)
Don't change your meds for now.  If you notice other changes then let me know.  Take care.  Glad to see you.

## 2022-04-26 DIAGNOSIS — N189 Chronic kidney disease, unspecified: Secondary | ICD-10-CM | POA: Insufficient documentation

## 2022-04-26 DIAGNOSIS — R7989 Other specified abnormal findings of blood chemistry: Secondary | ICD-10-CM | POA: Insufficient documentation

## 2022-04-26 NOTE — Assessment & Plan Note (Signed)
Advised to avoid NSAIDs.  She has renal follow-up pending.

## 2022-04-26 NOTE — Assessment & Plan Note (Signed)
Previous abdominal pain resolved.  She improved with addition of Carafate.  We agreed to hold off on GI evaluation and right upper quadrant ultrasounds since she is better in the meantime.  She will update me as needed.

## 2022-04-30 ENCOUNTER — Other Ambulatory Visit: Payer: Self-pay | Admitting: Family Medicine

## 2022-05-01 NOTE — Telephone Encounter (Signed)
Refill request Bupropion LR 01/30/22 #180 Fluoxetine LR 01/30/22 #180 Last office visit 04/24/22

## 2022-05-15 DIAGNOSIS — H40013 Open angle with borderline findings, low risk, bilateral: Secondary | ICD-10-CM | POA: Diagnosis not present

## 2022-05-15 DIAGNOSIS — Z0100 Encounter for examination of eyes and vision without abnormal findings: Secondary | ICD-10-CM | POA: Diagnosis not present

## 2022-06-26 DIAGNOSIS — N1832 Chronic kidney disease, stage 3b: Secondary | ICD-10-CM | POA: Diagnosis not present

## 2022-06-26 DIAGNOSIS — K589 Irritable bowel syndrome without diarrhea: Secondary | ICD-10-CM | POA: Diagnosis not present

## 2022-06-26 DIAGNOSIS — Z9884 Bariatric surgery status: Secondary | ICD-10-CM | POA: Diagnosis not present

## 2022-06-26 DIAGNOSIS — F411 Generalized anxiety disorder: Secondary | ICD-10-CM | POA: Diagnosis not present

## 2022-06-26 DIAGNOSIS — M154 Erosive (osteo)arthritis: Secondary | ICD-10-CM | POA: Diagnosis not present

## 2022-06-26 DIAGNOSIS — K219 Gastro-esophageal reflux disease without esophagitis: Secondary | ICD-10-CM | POA: Diagnosis not present

## 2022-06-26 DIAGNOSIS — I10 Essential (primary) hypertension: Secondary | ICD-10-CM | POA: Diagnosis not present

## 2022-06-26 DIAGNOSIS — E668 Other obesity: Secondary | ICD-10-CM | POA: Diagnosis not present

## 2022-07-06 DIAGNOSIS — Z9884 Bariatric surgery status: Secondary | ICD-10-CM | POA: Diagnosis not present

## 2022-07-06 DIAGNOSIS — N200 Calculus of kidney: Secondary | ICD-10-CM | POA: Diagnosis not present

## 2022-07-06 DIAGNOSIS — K219 Gastro-esophageal reflux disease without esophagitis: Secondary | ICD-10-CM | POA: Diagnosis not present

## 2022-07-06 DIAGNOSIS — N1832 Chronic kidney disease, stage 3b: Secondary | ICD-10-CM | POA: Diagnosis not present

## 2022-07-06 DIAGNOSIS — I10 Essential (primary) hypertension: Secondary | ICD-10-CM | POA: Diagnosis not present

## 2022-07-06 DIAGNOSIS — M154 Erosive (osteo)arthritis: Secondary | ICD-10-CM | POA: Diagnosis not present

## 2022-07-06 DIAGNOSIS — E668 Other obesity: Secondary | ICD-10-CM | POA: Diagnosis not present

## 2022-07-06 DIAGNOSIS — R8290 Unspecified abnormal findings in urine: Secondary | ICD-10-CM | POA: Diagnosis not present

## 2022-07-06 DIAGNOSIS — F411 Generalized anxiety disorder: Secondary | ICD-10-CM | POA: Diagnosis not present

## 2022-07-06 DIAGNOSIS — E211 Secondary hyperparathyroidism, not elsewhere classified: Secondary | ICD-10-CM | POA: Diagnosis not present

## 2022-07-17 DIAGNOSIS — Z6836 Body mass index (BMI) 36.0-36.9, adult: Secondary | ICD-10-CM | POA: Diagnosis not present

## 2022-07-17 DIAGNOSIS — Z1231 Encounter for screening mammogram for malignant neoplasm of breast: Secondary | ICD-10-CM | POA: Diagnosis not present

## 2022-07-17 DIAGNOSIS — Z124 Encounter for screening for malignant neoplasm of cervix: Secondary | ICD-10-CM | POA: Diagnosis not present

## 2022-07-26 ENCOUNTER — Encounter: Payer: Self-pay | Admitting: Family Medicine

## 2022-07-27 MED ORDER — OMEPRAZOLE 20 MG PO CPDR: 20.0000 mg | DELAYED_RELEASE_CAPSULE | Freq: Two times a day (BID) | ORAL | 3 refills | Status: DC

## 2022-08-07 ENCOUNTER — Ambulatory Visit (INDEPENDENT_AMBULATORY_CARE_PROVIDER_SITE_OTHER): Payer: Medicare HMO | Admitting: Family Medicine

## 2022-08-07 ENCOUNTER — Encounter: Payer: Self-pay | Admitting: Family Medicine

## 2022-08-07 VITALS — BP 120/70 | HR 68 | Temp 97.7°F | Ht 68.0 in | Wt 228.0 lb

## 2022-08-07 DIAGNOSIS — F411 Generalized anxiety disorder: Secondary | ICD-10-CM | POA: Diagnosis not present

## 2022-08-07 DIAGNOSIS — Z23 Encounter for immunization: Secondary | ICD-10-CM

## 2022-08-07 DIAGNOSIS — R7989 Other specified abnormal findings of blood chemistry: Secondary | ICD-10-CM

## 2022-08-07 MED ORDER — LORAZEPAM 0.5 MG PO TABS: 0.5000 mg | ORAL_TABLET | Freq: Every day | ORAL | 1 refills | Status: AC | PRN

## 2022-08-07 NOTE — Progress Notes (Unsigned)
CKD d/w pt.  Cr d/w pt.  Discussed diet, prev labs.  She has increased her water intake.  She is going to get new renal doc, as Dr.  Lanora Manis is retiring per patient report.  She is not taking nsaids.  NSAID cautions discussed with patient.  GERD controlled with PPI.  She felt better on med.   D/w pt about PNA vaccine. See AVS.    She is potentially going to cut back on her work schedule.  D/w pt.  Mood d/w pt.  Still on SSRI and wellbutrin.  Taking BZD prn.  No ADE on meds.  She feels less need for social interaction but still does well with patients at work.  We talked about monitoring that symptom. She has a list of goals to accomplish when she is working less.   Meds, vitals, and allergies reviewed.  ROS: Per HPI unless specifically indicated in ROS section   GEN: nad, alert and oriented, speech and judgment intact. HEENT: ncat NECK: supple w/o LA CV: rrr.   PULM: ctab, no inc wob ABD: soft, +bs SKIN: Well-perfused.  See after visit summary.  35 minutes were devoted to patient care in this encounter (this includes time spent reviewing the patient's file/history, interviewing and examining the patient, counseling/reviewing plan with patient).

## 2022-08-07 NOTE — Patient Instructions (Addendum)
I would get a PNA 20 vaccine when possible.   Take care.  Glad to see you. Schedule a medicare visit ~April 2024.

## 2022-08-09 NOTE — Assessment & Plan Note (Signed)
Discussed avoiding NSAIDs, staying well-hydrated, and continue work on diet and exercise.  She is going to have routine recheck with the renal clinic.

## 2022-08-09 NOTE — Assessment & Plan Note (Signed)
Would continue fluoxetine and Wellbutrin as is.  She has used lorazepam rarely.  She is going to cut back on her work schedule and that may be beneficial for her.  She has a list of goals to accomplish when she is working less.  She can update me as needed.  Okay for outpatient follow-up.

## 2022-10-26 DIAGNOSIS — N1832 Chronic kidney disease, stage 3b: Secondary | ICD-10-CM | POA: Diagnosis not present

## 2022-10-26 DIAGNOSIS — D497 Neoplasm of unspecified behavior of endocrine glands and other parts of nervous system: Secondary | ICD-10-CM | POA: Diagnosis not present

## 2022-10-26 DIAGNOSIS — R8290 Unspecified abnormal findings in urine: Secondary | ICD-10-CM | POA: Diagnosis not present

## 2022-10-26 DIAGNOSIS — K589 Irritable bowel syndrome without diarrhea: Secondary | ICD-10-CM | POA: Diagnosis not present

## 2022-10-26 DIAGNOSIS — K219 Gastro-esophageal reflux disease without esophagitis: Secondary | ICD-10-CM | POA: Diagnosis not present

## 2022-10-26 DIAGNOSIS — N200 Calculus of kidney: Secondary | ICD-10-CM | POA: Diagnosis not present

## 2022-10-26 DIAGNOSIS — I10 Essential (primary) hypertension: Secondary | ICD-10-CM | POA: Diagnosis not present

## 2022-10-26 DIAGNOSIS — E668 Other obesity: Secondary | ICD-10-CM | POA: Diagnosis not present

## 2022-10-26 DIAGNOSIS — F411 Generalized anxiety disorder: Secondary | ICD-10-CM | POA: Diagnosis not present

## 2022-10-28 ENCOUNTER — Other Ambulatory Visit: Payer: Self-pay | Admitting: Family Medicine

## 2022-11-06 DIAGNOSIS — E668 Other obesity: Secondary | ICD-10-CM | POA: Diagnosis not present

## 2022-11-06 DIAGNOSIS — N200 Calculus of kidney: Secondary | ICD-10-CM | POA: Diagnosis not present

## 2022-11-06 DIAGNOSIS — I1310 Hypertensive heart and chronic kidney disease without heart failure, with stage 1 through stage 4 chronic kidney disease, or unspecified chronic kidney disease: Secondary | ICD-10-CM | POA: Diagnosis not present

## 2022-11-06 DIAGNOSIS — E2089 Other specified hypoparathyroidism: Secondary | ICD-10-CM | POA: Diagnosis not present

## 2022-11-06 DIAGNOSIS — I10 Essential (primary) hypertension: Secondary | ICD-10-CM | POA: Diagnosis not present

## 2022-11-07 DIAGNOSIS — I1310 Hypertensive heart and chronic kidney disease without heart failure, with stage 1 through stage 4 chronic kidney disease, or unspecified chronic kidney disease: Secondary | ICD-10-CM | POA: Insufficient documentation

## 2022-11-07 DIAGNOSIS — E2089 Other specified hypoparathyroidism: Secondary | ICD-10-CM | POA: Insufficient documentation

## 2022-11-11 ENCOUNTER — Encounter: Payer: Self-pay | Admitting: Family Medicine

## 2022-11-11 ENCOUNTER — Ambulatory Visit
Admission: EM | Admit: 2022-11-11 | Discharge: 2022-11-11 | Disposition: A | Discharge status: Home / Self Care | Payer: Medicare HMO

## 2022-11-11 DIAGNOSIS — U071 COVID-19: Secondary | ICD-10-CM

## 2022-11-11 HISTORY — DX: Chronic kidney disease, stage 3 unspecified: N18.30

## 2022-11-11 MED ORDER — MOLNUPIRAVIR EUA 200MG CAPSULE: 4.0000 | ORAL_CAPSULE | Freq: Two times a day (BID) | ORAL | 0 refills | Status: AC

## 2022-11-11 NOTE — Discharge Instructions (Signed)
Follow up here or with your primary care provider if your symptoms are worsening or not improving.     

## 2022-11-11 NOTE — ED Provider Notes (Signed)
Carmen Buchanan    CSN: [id number] Arrival date & time: 11/11/22  0944      History   Chief Complaint Chief Complaint  Patient presents with   Nasal Congestion   Headache    HPI TORREN Buchanan is a 67 y.o. female.    Headache   Patient presents to urgent care for nasal congestion and headache since Thursday.  She endorses positive COVID test at home this morning.  Past Medical History:  Diagnosis Date   Anemia    past hx since bariatric surgery 2012- Iron infusion last 09-2019   Anxiety    much improved after weight loss started in late 2012   Arthritis    Complication of anesthesia    after hemorrhoid surgery-inability to sleep for 3 weeks   GERD (gastroesophageal reflux disease)    Hemorrhoid    HTN (hypertension)    stopped meds after bariatric surgery   IBS (irritable bowel syndrome)    intermittent   Migraine, unspecified, without mention of intractable migraine without mention of status migrainosus    Obesity    Renal stones     Patient Active Problem List   Diagnosis Date Noted   Creatinine elevation 04/26/2022   Abdominal pain 03/08/2022   Nausea 08/30/2021   Tachycardia 02/09/2021   History of anemia 02/09/2021   Advance care planning 11/12/2019   Routine general medical examination at a health care facility 04/30/2016   Renal stone 06/07/2013   Hand dermatitis 10/17/2012   Hemorrhoid 11/08/2011   S/P gastric bypass 07/28/2011   IRRITABLE BOWEL SYNDROME 12/10/2008   OBESITY 12/30/2007   Anxiety state 12/30/2007   MIGRAINE HEADACHE 12/30/2007    Past Surgical History:  Procedure Laterality Date   ABLATION     uterus   CESAREAN SECTION     CYSTOSCOPY N/A 07/14/2013   Procedure: CYSTOSCOPY;  Surgeon: Alexis Frock, MD;  Location: [facility];  Service: Urology;  Laterality: N/A;   GASTRIC BYPASS     sleeve procedure 06/2011 in [place]  2012   HEMORRHOID SURGERY  2013   LASER ABLATION CONDYLOMA CERVICAL / VULVAR   5/04   uncontrollable bleeding   LUMBAR [place] SURGERY  7/01   L4-5; Dr. Rolin Barry   NEPHROLITHOTOMY Right 07/14/2013   Procedure: NEPHROLITHOTOMY PERCUTANEOUS  Procedure: Right 1st stage Percutaneous Nephrostolithotomy with surgeon access and cystoscopy;  Surgeon: Alexis Frock, MD;  Location: [facility];  Service: Urology;  Laterality: Right;   NEPHROLITHOTOMY Right 07/16/2013   Procedure: NEPHROLITHOTOMY PERCUTANEOUS SECOND LOOK, diagnostic ureteroscopy and stent placement on the right ;  Surgeon: Alexis Frock, MD;  Location: [facility];  Service: Urology;  Laterality: Right;   TONSILLECTOMY AND ADENOIDECTOMY  1970    OB History   No obstetric history on file.      Home Medications    Prior to Admission medications   Medication Sig Start Date End Date Taking? Authorizing Provider  buPROPion [employer] SR) 150 MG 12 hr tablet TAKE 1 TABLET BY MOUTH TWICE A DAY 10/30/22   Tonia Ghent, MD  FLUoxetine (PROZAC) 40 MG capsule TAKE 2 CAPSULES BY MOUTH EVERY DAY 10/30/22   Tonia Ghent, MD  lisinopril (ZESTRIL) 5 MG tablet Take 5 mg by mouth daily.    [provider]  LORazepam (ATIVAN) 0.5 MG tablet Take 1 tablet (0.5 mg total) by mouth daily as needed. 08/07/22   Tonia Ghent, MD  omeprazole (PRILOSEC) 20 MG capsule Take 1  capsule (20 mg total) by mouth in the morning and at bedtime. 07/27/22   Tonia Ghent, MD  polyethylene glycol [facility] / Floria Raveling) packet Take 17 g by mouth daily.    [provider]    Family History Family History  Problem Relation Age of Onset   Coronary artery disease Father        MI; CABG   Colon polyps Father    Aortic aneurysm Mother        on beta blocker   Other Mother        Cardiovascualr disease   Ulcers Mother    Colon polyps Mother    Heart disease Mother    Colon polyps Other        Family history   Colon cancer Neg Hx    Breast cancer Neg Hx    Esophageal cancer Neg Hx    Rectal cancer Neg Hx    Stomach cancer Neg  Hx     Social History Social History   Tobacco Use   Smoking status: Never   Smokeless tobacco: Never  Vaping Use   Vaping Use: Never used  Substance Use Topics   Alcohol use: Not Currently   Drug use: No     Allergies   Ciprofloxacin, Latex, Morphine, and Nsaids   Review of Systems Review of Systems  Neurological:  Positive for headaches.     Physical Exam Triage Vital Signs ED Triage Vitals [11/11/22 1057]  Enc Vitals Group     BP (!) 145/82     Pulse Rate 65     Resp 18     Temp 98.5 F (36.9 C)     Temp Source Oral     SpO2 95 %     Weight      Height      Head Circumference      Peak Flow      Pain Score      Pain Loc      Pain Edu?      Excl. in [place]?    No data found.  Updated Vital Signs BP (!) 145/82 (BP Location: Left Arm)   Pulse 65   Temp 98.5 F (36.9 C) (Oral)   Resp 18   SpO2 95%   Visual Acuity Right Eye Distance:   Left Eye Distance:   Bilateral Distance:    Right Eye Near:   Left Eye Near:    Bilateral Near:     Physical Exam Vitals reviewed.  Constitutional:      Appearance: She is well-developed. She is not ill-appearing.  Neurological:     Mental Status: She is alert and oriented to person, place, and time.  Psychiatric:        Mood and Affect: Mood normal.        Behavior: Behavior normal.      UC Treatments / Results  Labs (all labs ordered are listed, but only abnormal results are displayed) Labs Reviewed - No data to display  EKG   Radiology No results found.  Procedures Procedures (including critical care time)  Medications Ordered in UC Medications - No data to display  Initial Impression / Assessment and Plan / UC Course  I have reviewed the triage vital signs and the nursing notes.  Pertinent labs & imaging results that were available during my care of the patient were reviewed by me and considered in my medical decision making (see chart for details).   Patient is afebrile here  without  recent antipyretics. Satting well on room air. Overall is well appearing, well hydrated, without respiratory distress. Pulmonary exam is unremarkable.  Lungs CTAB without wheezing, rhonchi, rales.  Positive COVID test at home. Apparently mild symptoms. Will prescribe antiviral at her request. She is eligible for low-dose Paxlovid with GFR > 30, however concern for GFR=34 and will rather use previously prescribed and tolerated molnupiravir instead.  Final Clinical Impressions(s) / UC Diagnoses   Final diagnoses:  None   Discharge Instructions   None    ED Prescriptions   None    PDMP not reviewed this encounter.   Rose Phi, [place] 11/11/22 1116

## 2022-11-11 NOTE — ED Triage Notes (Signed)
Patient presents to [facility] for nasal congestion and HA since Thursday. Had a positive covid test this morning. Taking tylenol sinus.

## 2022-11-13 NOTE — Telephone Encounter (Signed)
[place] Night - Client TELEPHONE ADVICE RECORD Access[facility]urse Patient Name: Carmen Buchanan EWS Gender: Female DOB: 12/07/1955 Age: 67 Y 9 M 22 D Return Phone Number: [phone] (Primary) Address: City/ State/ Zip: [place]  [address] Client [place] Night - Client Client Site [place] - Night Provider Renford Dills - MD Contact Type Call Who Is Calling Patient / Member / Family / Caregiver Call Type Triage / Clinical Relationship To Patient Self Return Phone Number [phone] (Primary) Chief Complaint Headache Reason for Call Symptomatic / Request for [place] states had a positive COVID19 test during the night; wants meds; Congestion, runny nose, bad headache, cough; Translation No Nurse Assessment Nurse: Marcello Moores, RN, Noah Delaine Date/Time (Eastern Time): 11/11/2022 8:42:32 AM Confirm and document reason for call. If symptomatic, describe symptoms. ---Caller states had a positive COVID19 test during the night; wants meds; Congestion, runny nose, bad headache, cough. Denies fever. She would rate her headache as an 8 or 9/10. Denies vomiting but has been nauseated. Does the patient have any new or worsening symptoms? ---Yes Will a triage be completed? ---Yes Related visit to physician within the last 2 weeks? ---No Does the PT have any chronic conditions? (i.e. diabetes, asthma, this includes High risk factors for pregnancy, etc.) ---Yes List chronic conditions. ---CKD 3B Is this a behavioral health or substance abuse call? ---No Guidelines Guideline Title Affirmed Question Affirmed Notes Nurse Date/Time ([place] Time) COVID-19 - Diagnosed or Suspected MILD difficulty breathing (e.g., minimal/no SOB at rest, SOB with walking, pulse <100) Marcello Moores, RN, Noah Delaine 11/11/2022 8:44:20 AM PLEASE NOTE: All timestamps contained within this report are  represented as [place] Standard Time. CONFIDENTIALTY NOTICE: This fax transmission is intended only for the addressee. It contains information that is legally privileged, confidential or otherwise protected from use or disclosure. If you are not the intended recipient, you are strictly prohibited from reviewing, disclosing, copying using or disseminating any of this information or taking any action in reliance on or regarding this information. If you have received this fax in error, please notify us immediately by telephone so that we can arrange for its return to [place]. Phone: [phone], Toll-Free: [phone], Fax: [phone] Page: 2 of 2 Call Id: [id number] [place]. Time Eilene Ghazi Time) Disposition Final User 11/11/2022 8:29:33 AM Attempt made - message left Lorre Munroe 11/11/2022 8:57:56 AM See HCP within 4 Hours (or PCP triage) Yes Marcello Moores, RN, Noah Delaine Final Disposition 11/11/2022 8:57:56 AM See HCP within 4 Hours (or PCP triage) Yes Marcello Moores, RN, Lenice Llamas Disagree/Comply Comply Caller Understands Yes PreDisposition Did not know what to do Care Advice Given Per Guideline SEE HCP (OR PCP TRIAGE) WITHIN 4 HOURS: * IF OFFICE WILL BE CLOSED AND NO PCP (PRIMARY CARE PROVIDER) SECOND-LEVEL TRIAGE: You need to be seen within the next 3 or 4 hours. A nearby Urgent Care Center [facility]) is often a good source of care. Another choice is to go to the ED. Go sooner if you become worse. GENERAL CARE ADVICE FOR COVID-19 SYMPTOMS: * Cough: Use cough drops. * Feeling dehydrated: Drink extra liquids. If the air in your home is dry, use a humidifier. CALL BACK IF: * You become worse CARE ADVICE given per COVID-19 - DIAGNOSED OR SUSPECTED (Adult) guideline. Referrals GO TO FACILITY UNDECIDED [facility] [place] [facility]

## 2022-11-13 NOTE — Telephone Encounter (Signed)
Per chart review tab pt was seen [facility] on 11/11/22 also see my chart notes in this note also. Sending note to DR Damita Dunnings who is out of office today, Dr Darnell Level who is in office and [place] pool.

## 2022-11-17 ENCOUNTER — Telehealth: Payer: Medicare HMO | Admitting: Physician Assistant

## 2022-11-17 DIAGNOSIS — J019 Acute sinusitis, unspecified: Secondary | ICD-10-CM

## 2022-11-17 DIAGNOSIS — B9689 Other specified bacterial agents as the cause of diseases classified elsewhere: Secondary | ICD-10-CM

## 2022-11-17 MED ORDER — DOXYCYCLINE HYCLATE 100 MG PO TABS: 100.0000 mg | ORAL_TABLET | Freq: Two times a day (BID) | ORAL | 0 refills | Status: DC

## 2022-11-17 NOTE — Progress Notes (Signed)
Virtual Visit Consent   Carmen Buchanan, you are scheduled for a virtual visit with a [facility] provider today. Just as with appointments in the office, your consent must be obtained to participate. Your consent will be active for this visit and any virtual visit you may have with one of our providers in the next 365 days. If you have a MyChart account, a copy of this consent can be sent to you electronically.  As this is a virtual visit, video technology does not allow for your provider to perform a traditional examination. This may limit your provider's ability to fully assess your condition. If your provider identifies any concerns that need to be evaluated in person or the need to arrange testing (such as labs, EKG, etc.), we will make arrangements to do so. Although advances in technology are sophisticated, we cannot ensure that it will always work on either your end or our end. If the connection with a video visit is poor, the visit may have to be switched to a telephone visit. With either a video or telephone visit, we are not always able to ensure that we have a secure connection.  By engaging in this virtual visit, you consent to the provision of healthcare and authorize for your insurance to be billed (if applicable) for the services provided during this visit. Depending on your insurance coverage, you may receive a charge related to this service.  I need to obtain your verbal consent now. Are you willing to proceed with your visit today? Carmen Buchanan has provided verbal consent on 11/17/2022 for a virtual visit (video or telephone). Leeanne Rio, [place]  Date: 11/17/2022 9:04 AM  Virtual Visit via Video Note   I, Leeanne Rio, connected with  Carmen Buchanan  ([id number], May 07, 1964) on 11/17/22 at 10:00 AM EST by a video-enabled telemedicine application and verified that I am speaking with the correct person using two identifiers.  Location: Patient: Virtual Visit Location  Patient: Home Provider: Virtual Visit Location Provider: Home Office   I discussed the limitations of evaluation and management by telemedicine and the availability of in person appointments. The patient expressed understanding and agreed to proceed.    History of Present Illness: Carmen Buchanan is a 67 y.o. who identifies as a female who was assigned female at birth, and is being seen today for concern of a sinusitis secondary to recent COVID-64. Diagnosed on 2/17, completed course of antiviral (Molnupiravir). Notes initial constitutional symptoms have resolved. Still with substantial nasal and head congestion, with thicker nasal discharge that is now turned green from clear. Denies fever. Some sinus pain and tooth pain. Notes some ear pressure and congestion but without pain.   OTC -- Nothing  HPI: HPI  Problems:  Patient Active Problem List   Diagnosis Date Noted   Creatinine elevation 04/26/2022   Abdominal pain 03/08/2022   Nausea 08/30/2021   Tachycardia 02/09/2021   History of anemia 02/09/2021   Advance care planning 11/12/2019   Routine general medical examination at a health care facility 04/30/2016   Renal stone 06/07/2013   Hand dermatitis 10/17/2012   Hemorrhoid 11/08/2011   S/P gastric bypass 07/28/2011   IRRITABLE BOWEL SYNDROME 12/10/2008   OBESITY 12/30/2007   Anxiety state 12/30/2007   MIGRAINE HEADACHE 12/30/2007    Allergies:  Allergies  Allergen Reactions   Ciprofloxacin     REACTION: nausea   Latex    Morphine     REACTION: HA and NAV, but  tolerates hydrocodone   Nsaids     Due to gastric bypass   Medications:  Current Outpatient Medications:    doxycycline (VIBRA-TABS) 100 MG tablet, Take 1 tablet (100 mg total) by mouth 2 (two) times daily., Disp: 20 tablet, Rfl: 0   buPROPion (WELLBUTRIN SR) 150 MG 12 hr tablet, TAKE 1 TABLET BY MOUTH TWICE A DAY, Disp: 180 tablet, Rfl: 1   FLUoxetine (PROZAC) 40 MG capsule, TAKE 2 CAPSULES BY MOUTH EVERY DAY,  Disp: 180 capsule, Rfl: 1   lisinopril (ZESTRIL) 5 MG tablet, Take 5 mg by mouth daily., Disp: , Rfl:    LORazepam (ATIVAN) 0.5 MG tablet, Take 1 tablet (0.5 mg total) by mouth daily as needed., Disp: 30 tablet, Rfl: 1   omeprazole (PRILOSEC) 20 MG capsule, Take 1 capsule (20 mg total) by mouth in the morning and at bedtime., Disp: 180 capsule, Rfl: 3   polyethylene glycol (MIRALAX / GLYCOLAX) packet, Take 17 g by mouth daily., Disp: , Rfl:   Observations/Objective: Patient is well-developed, well-nourished in no acute distress.  Resting comfortably at home.  Head is normocephalic, atraumatic.  No labored breathing.  Speech is clear and coherent with logical content.  Patient is alert and oriented at baseline.   Assessment and Plan: 1. Acute bacterial sinusitis - doxycycline (VIBRA-TABS) 100 MG tablet; Take 1 tablet (100 mg total) by mouth 2 (two) times daily.  Dispense: 20 tablet; Refill: 0  Secondary sinusitis after COVID. Concern for secondary bacterial infection. Rx Doxycycline.  Increase fluids.  Rest.  Saline nasal spray.  Probiotic.  Mucinex as directed.  Humidifier in bedroom. Coricidin HBP OTC.  Call or return to clinic if symptoms are not improving.   Follow Up Instructions: I discussed the assessment and treatment plan with the patient. The patient was provided an opportunity to ask questions and all were answered. The patient agreed with the plan and demonstrated an understanding of the instructions.  A copy of instructions were sent to the patient via MyChart unless otherwise noted below.   The patient was advised to call back or seek an in-person evaluation if the symptoms worsen or if the condition fails to improve as anticipated.  Time:  I spent 10 minutes with the patient via telehealth technology discussing the above problems/concerns.    Leeanne Rio, PA-C

## 2022-11-17 NOTE — Patient Instructions (Signed)
Carmen Buchanan, thank you for joining Leeanne Rio, PA-C for today's virtual visit.  While this provider is not your primary care provider (PCP), if your PCP is located in our provider database this encounter information will be shared with them immediately following your visit.   [facility] account gives you access to today's visit and all your visits, tests, and labs performed at [facility] " click here if you don't have a [facility] account or go to [email or web address]  Consent: (Patient) Carmen Buchanan provided verbal consent for this virtual visit at the beginning of the encounter.  Current Medications:  Current Outpatient Medications:    doxycycline (VIBRA-TABS) 100 MG tablet, Take 1 tablet (100 mg total) by mouth 2 (two) times daily., Disp: 20 tablet, Rfl: 0   buPROPion (WELLBUTRIN SR) 150 MG 12 hr tablet, TAKE 1 TABLET BY MOUTH TWICE A DAY, Disp: 180 tablet, Rfl: 1   FLUoxetine (PROZAC) 40 MG capsule, TAKE 2 CAPSULES BY MOUTH EVERY DAY, Disp: 180 capsule, Rfl: 1   lisinopril (ZESTRIL) 5 MG tablet, Take 5 mg by mouth daily., Disp: , Rfl:    LORazepam (ATIVAN) 0.5 MG tablet, Take 1 tablet (0.5 mg total) by mouth daily as needed., Disp: 30 tablet, Rfl: 1   omeprazole (PRILOSEC) 20 MG capsule, Take 1 capsule (20 mg total) by mouth in the morning and at bedtime., Disp: 180 capsule, Rfl: 3   polyethylene glycol (MIRALAX / GLYCOLAX) packet, Take 17 g by mouth daily., Disp: , Rfl:    Medications ordered in this encounter:  Meds ordered this encounter  Medications   doxycycline (VIBRA-TABS) 100 MG tablet    Sig: Take 1 tablet (100 mg total) by mouth 2 (two) times daily.    Dispense:  20 tablet    Refill:  0    Order Specific Question:   Supervising Provider    Answer:   Chase Picket [id number]     *If you need refills on other medications prior to your next appointment, please contact your pharmacy*  Follow-Up: Call back or seek an  in-person evaluation if the symptoms worsen or if the condition fails to improve as anticipated.  [place] [phone]  Other Instructions Please take antibiotic as directed.  Increase fluid intake.  Use Saline nasal spray.  Take a daily multivitamin. You can use OTC Coricidin HBP.  Place a humidifier in the bedroom.  Please call or return clinic if symptoms are not improving.  Sinusitis Sinusitis is redness, soreness, and swelling (inflammation) of the paranasal sinuses. Paranasal sinuses are air pockets within the bones of your face (beneath the eyes, the middle of the forehead, or above the eyes). In healthy paranasal sinuses, mucus is able to drain out, and air is able to circulate through them by way of your nose. However, when your paranasal sinuses are inflamed, mucus and air can become trapped. This can allow bacteria and other germs to grow and cause infection. Sinusitis can develop quickly and last only a short time (acute) or continue over a long period (chronic). Sinusitis that lasts for more than 12 weeks is considered chronic.  CAUSES  Causes of sinusitis include: Allergies. Structural abnormalities, such as displacement of the cartilage that separates your nostrils (deviated septum), which can decrease the air flow through your nose and sinuses and affect sinus drainage. Functional abnormalities, such as when the small hairs (cilia) that line your sinuses and help remove mucus do not work  properly or are not present. SYMPTOMS  Symptoms of acute and chronic sinusitis are the same. The primary symptoms are pain and pressure around the affected sinuses. Other symptoms include: Upper toothache. Earache. Headache. Bad breath. Decreased sense of smell and taste. A cough, which worsens when you are lying flat. Fatigue. Fever. Thick drainage from your nose, which often is green and may contain pus (purulent). Swelling and warmth over the affected  sinuses. DIAGNOSIS  Your caregiver will perform a physical exam. During the exam, your caregiver may: Look in your nose for signs of abnormal growths in your nostrils (nasal polyps). Tap over the affected sinus to check for signs of infection. View the inside of your sinuses (endoscopy) with a special imaging device with a light attached (endoscope), which is inserted into your sinuses. If your caregiver suspects that you have chronic sinusitis, one or more of the following tests may be recommended: Allergy tests. Nasal culture A sample of mucus is taken from your nose and sent to a lab and screened for bacteria. Nasal cytology A sample of mucus is taken from your nose and examined by your caregiver to determine if your sinusitis is related to an allergy. TREATMENT  Most cases of acute sinusitis are related to a viral infection and will resolve on their own within 10 days. Sometimes medicines are prescribed to help relieve symptoms (pain medicine, decongestants, nasal steroid sprays, or saline sprays).  However, for sinusitis related to a bacterial infection, your caregiver will prescribe antibiotic medicines. These are medicines that will help kill the bacteria causing the infection.  Rarely, sinusitis is caused by a fungal infection. In theses cases, your caregiver will prescribe antifungal medicine. For some cases of chronic sinusitis, surgery is needed. Generally, these are cases in which sinusitis recurs more than 3 times per year, despite other treatments. HOME CARE INSTRUCTIONS  Drink plenty of water. Water helps thin the mucus so your sinuses can drain more easily. Use a humidifier. Inhale steam 3 to 4 times a day (for example, sit in the bathroom with the shower running). Apply a warm, moist washcloth to your face 3 to 4 times a day, or as directed by your caregiver. Use saline nasal sprays to help moisten and clean your sinuses. Take over-the-counter or prescription medicines for pain,  discomfort, or fever only as directed by your caregiver. SEEK IMMEDIATE MEDICAL CARE IF: You have increasing pain or severe headaches. You have nausea, vomiting, or drowsiness. You have swelling around your face. You have vision problems. You have a stiff neck. You have difficulty breathing. MAKE SURE YOU:  Understand these instructions. Will watch your condition. Will get help right away if you are not doing well or get worse. Document Released: 09/11/2005 Document Revised: 12/04/2011 Document Reviewed: 09/26/2011 [facility] Patient Information 2014 [place], [place].    If you have been instructed to have an in-person evaluation today at a local Urgent Care facility, please use the link below. It will take you to a list of all of our available [facility], including address, phone number and hours of operation. Please do not delay care.  [facility]  If you or a family member do not have a primary care provider, use the link below to schedule a visit and establish care. When you choose a [facility] primary care physician or advanced practice provider, you gain a long-term partner in health. Find a Primary Care Provider  Learn more about [facility]'s in-office and virtual care  options: [facility]

## 2022-11-21 ENCOUNTER — Encounter (HOSPITAL_BASED_OUTPATIENT_CLINIC_OR_DEPARTMENT_OTHER): Payer: Self-pay

## 2022-11-21 ENCOUNTER — Emergency Department (HOSPITAL_BASED_OUTPATIENT_CLINIC_OR_DEPARTMENT_OTHER): Payer: Medicare HMO

## 2022-11-21 ENCOUNTER — Emergency Department (HOSPITAL_BASED_OUTPATIENT_CLINIC_OR_DEPARTMENT_OTHER)
Admission: EM | Admit: 2022-11-21 | Discharge: 2022-11-21 | Disposition: A | Discharge status: Home / Self Care | Payer: Medicare HMO | Attending: Emergency Medicine | Admitting: Emergency Medicine

## 2022-11-21 DIAGNOSIS — J019 Acute sinusitis, unspecified: Secondary | ICD-10-CM | POA: Diagnosis not present

## 2022-11-21 DIAGNOSIS — N179 Acute kidney failure, unspecified: Secondary | ICD-10-CM

## 2022-11-21 DIAGNOSIS — R531 Weakness: Secondary | ICD-10-CM | POA: Diagnosis not present

## 2022-11-21 DIAGNOSIS — U071 COVID-19: Secondary | ICD-10-CM | POA: Insufficient documentation

## 2022-11-21 DIAGNOSIS — J0191 Acute recurrent sinusitis, unspecified: Secondary | ICD-10-CM

## 2022-11-21 DIAGNOSIS — I10 Essential (primary) hypertension: Secondary | ICD-10-CM | POA: Diagnosis not present

## 2022-11-21 DIAGNOSIS — Z91040 Latex allergy status: Secondary | ICD-10-CM | POA: Insufficient documentation

## 2022-11-21 DIAGNOSIS — R059 Cough, unspecified: Secondary | ICD-10-CM | POA: Diagnosis present

## 2022-11-21 LAB — URINALYSIS, ROUTINE W REFLEX MICROSCOPIC
Bilirubin Urine: NEGATIVE
Glucose, UA: NEGATIVE mg/dL
Hgb urine dipstick: NEGATIVE
Ketones, ur: NEGATIVE mg/dL
Leukocytes,Ua: NEGATIVE
Nitrite: NEGATIVE
Protein, ur: NEGATIVE mg/dL
Specific Gravity, Urine: 1.025 (ref 1.005–1.030)
pH: 5.5 (ref 5.0–8.0)

## 2022-11-21 LAB — BASIC METABOLIC PANEL
Anion gap: 5 (ref 5–15)
BUN: 43 mg/dL — ABNORMAL HIGH (ref 8–23)
CO2: 20 mmol/L — ABNORMAL LOW (ref 22–32)
Calcium: 8.5 mg/dL — ABNORMAL LOW (ref 8.9–10.3)
Chloride: 108 mmol/L (ref 98–111)
Creatinine, Ser: 2.13 mg/dL — ABNORMAL HIGH (ref 0.44–1.00)
GFR, Estimated: 25 mL/min — ABNORMAL LOW (ref >60)
Glucose, Bld: 86 mg/dL (ref 70–99)
Potassium: 4.3 mmol/L (ref 3.5–5.1)
Sodium: 133 mmol/L — ABNORMAL LOW (ref 135–145)

## 2022-11-21 LAB — CBC WITH DIFFERENTIAL/PLATELET
Abs Immature Granulocytes: 0.12 10*3/uL — ABNORMAL HIGH (ref 0.00–0.07)
Basophils Absolute: 0.1 10*3/uL (ref 0.0–0.1)
Basophils Relative: 1 %
Eosinophils Absolute: 0.1 10*3/uL (ref 0.0–0.5)
Eosinophils Relative: 1 %
HCT: 37.2 % (ref 36.0–46.0)
Hemoglobin: 11.7 g/dL — ABNORMAL LOW (ref 12.0–15.0)
Immature Granulocytes: 1 %
Lymphocytes Relative: 19 %
Lymphs Abs: 1.9 10*3/uL (ref 0.7–4.0)
MCH: 27.1 pg (ref 26.0–34.0)
MCHC: 31.5 g/dL (ref 30.0–36.0)
MCV: 86.1 fL (ref 80.0–100.0)
Monocytes Absolute: 0.9 10*3/uL (ref 0.1–1.0)
Monocytes Relative: 9 %
Neutro Abs: 7.2 10*3/uL (ref 1.7–7.7)
Neutrophils Relative %: 69 %
Platelets: 371 10*3/uL (ref 150–400)
RBC: 4.32 MIL/uL (ref 3.87–5.11)
RDW: 14.1 % (ref 11.5–15.5)
WBC: 10.4 10*3/uL (ref 4.0–10.5)
nRBC: 0 % (ref 0.0–0.2)

## 2022-11-21 LAB — RESP PANEL BY RT-PCR (RSV, FLU A&B, COVID)  RVPGX2
Influenza A by PCR: NEGATIVE
Influenza B by PCR: NEGATIVE
Resp Syncytial Virus by PCR: NEGATIVE
SARS Coronavirus 2 by RT PCR: POSITIVE — AB

## 2022-11-21 MED ORDER — SODIUM CHLORIDE 0.9 % IV BOLUS
500.0000 mL | Freq: Once | INTRAVENOUS | Status: AC
  Administered 2022-11-21: 500 mL via INTRAVENOUS

## 2022-11-21 MED ORDER — AMOXICILLIN-POT CLAVULANATE 875-125 MG PO TABS: 1.0000 | ORAL_TABLET | Freq: Two times a day (BID) | ORAL | 0 refills | Status: DC

## 2022-11-21 NOTE — ED Triage Notes (Signed)
Pt dx with covid on 11/11/22. Pt seen by PCP Friday and was started on doxy on Friday for sinus infection. Pt not getting any better. Continues with congestion, mild SOB, Sinus pressure. Also reports left side pain

## 2022-11-21 NOTE — ED Provider Notes (Signed)
[facility] Provider Note   CSN: [id number] Arrival date & time: 11/21/22  1415     History {Add pertinent medical, surgical, social history, OB history to HPI:1} Chief Complaint  Patient presents with   Cough   Facial Pain    Carmen Buchanan is a 67 y.o. female.  She said she was diagnosed with COVID about 10 days ago.  Less than a week ago she has had sinus congestio facial pain.  She had a video visit with her doctor and he prescribed her doxycycline.  She does not feel any better.  Continues to have facial pain and sinus congestion feeling generally weak.  Nonproductive cough. No fever no abdominal pain vomiting diarrhea or urinary symptoms.  She also notices a pain in her left flank.  She tried to get an appointment with her doctor but he could not see her until Thursday.  The history is provided by the patient.  URI Presenting symptoms: congestion, cough, facial pain, fatigue and rhinorrhea   Presenting symptoms: no fever   Congestion:    Location:  Nasal Progression:  Unchanged Associated symptoms: sinus pain   Risk factors: recent illness        Home Medications Prior to Admission medications   Medication Sig Start Date End Date Taking? Authorizing Provider  doxycycline (VIBRA-TABS) 100 MG tablet Take 1 tablet (100 mg total) by mouth 2 (two) times daily. 11/17/22  Yes Brunetta Jeans, PA-C  buPROPion [facility] SR) 150 MG 12 hr tablet TAKE 1 TABLET BY MOUTH TWICE A DAY 10/30/22   Tonia Ghent, MD  FLUoxetine (PROZAC) 40 MG capsule TAKE 2 CAPSULES BY MOUTH EVERY DAY 10/30/22   Tonia Ghent, MD  lisinopril (ZESTRIL) 5 MG tablet Take 5 mg by mouth daily.    [provider]  LORazepam (ATIVAN) 0.5 MG tablet Take 1 tablet (0.5 mg total) by mouth daily as needed. 08/07/22   Tonia Ghent, MD  omeprazole (PRILOSEC) 20 MG capsule Take 1 capsule (20 mg total) by mouth in the morning and at bedtime. 07/27/22   Tonia Ghent, MD  polyethylene glycol [facility] / Floria Raveling) packet Take 17 g by mouth daily.    [provider]      Allergies    Ciprofloxacin, Latex, Morphine, and Nsaids    Review of Systems   Review of Systems  Constitutional:  Positive for fatigue. Negative for fever.  HENT:  Positive for congestion, rhinorrhea and sinus pain.   Eyes:  Negative for visual disturbance.  Respiratory:  Positive for cough.   Cardiovascular:  Negative for chest pain.  Gastrointestinal:  Negative for abdominal pain.  Genitourinary:  Negative for dysuria.  Skin:  Negative for rash.    Physical Exam Updated Vital Signs BP (!) 117/58 (BP Location: Left Arm)   Pulse 76   Temp 97.7 F (36.5 C)   Resp 20   Ht '5\' 10"'$  (1.778 m)   Wt 106.6 kg   SpO2 100%   BMI 33.72 kg/m  Physical Exam Vitals and nursing note reviewed.  Constitutional:      General: She is not in acute distress.    Appearance: Normal appearance. She is well-developed.  HENT:     Head: Normocephalic and atraumatic.  Eyes:     Conjunctiva/sclera: Conjunctivae normal.  Cardiovascular:     Rate and Rhythm: Normal rate and regular rhythm.     Heart sounds: No murmur heard. Pulmonary:  Effort: Pulmonary effort is normal. No respiratory distress.     Breath sounds: Normal breath sounds.  Abdominal:     Palpations: Abdomen is soft.     Tenderness: There is no abdominal tenderness. There is no guarding or rebound.  Musculoskeletal:        General: No deformity.     Cervical back: Neck supple.     Right lower leg: No edema.     Left lower leg: No edema.  Skin:    General: Skin is warm and dry.     Capillary Refill: Capillary refill takes less than 2 seconds.  Neurological:     General: No focal deficit present.     Mental Status: She is alert.  Psychiatric:        Mood and Affect: Mood normal.     ED Results / Procedures / Treatments   Labs (all labs ordered are listed, but only abnormal results are  displayed) Labs Reviewed - No data to display  EKG None  Radiology No results found.  Procedures Procedures  {Document cardiac monitor, telemetry assessment procedure when appropriate:1}  Medications Ordered in ED Medications  sodium chloride 0.9 % bolus 500 mL (has no administration in time range)    ED Course/ Medical Decision Making/ A&P   {   Click here for ABCD2, HEART and other calculatorsREFRESH Note before signing :1}                          Medical Decision Making Amount and/or Complexity of Data Reviewed Labs: ordered. Radiology: ordered.   This patient complains of ***; this involves an extensive number of treatment Options and is a complaint that carries with it a high risk of complications and morbidity. The differential includes ***  I ordered, reviewed and interpreted labs, which included *** I ordered medication *** and reviewed PMP when indicated. I ordered imaging studies which included *** and I independently    visualized and interpreted imaging which showed *** Additional history obtained from *** Previous records obtained and reviewed *** I consulted *** and discussed lab and imaging findings and discussed disposition.  Cardiac monitoring reviewed, *** Social determinants considered, *** Critical Interventions: ***  After the interventions stated above, I reevaluated the patient and found *** Admission and further testing considered, ***   {Document critical care time when appropriate:1} {Document review of labs and clinical decision tools ie heart score, Chads2Vasc2 etc:1}  {Document your independent review of radiology images, and any outside records:1} {Document your discussion with family members, caretakers, and with consultants:1} {Document social determinants of health affecting pt's care:1} {Document your decision making why or why not admission, treatments were needed:1} Final Clinical Impression(s) / ED Diagnoses Final diagnoses:   None    Rx / DC Orders ED Discharge Orders     None

## 2022-11-21 NOTE — ED Notes (Signed)
X-ray at bedside

## 2022-11-21 NOTE — Discharge Instructions (Addendum)
You are seen in the emergency department for continued sinus symptoms along with some general fatigue.  Your lab work showed your renal function to a slightly worsened and you were given some IV fluids.  Your COVID and flu test were pending at time of discharge and you can follow this up in [place].  We are starting you on some Augmentin for your sinus symptoms.  Please check in with your kidney specialist and your primary care doctor for close follow-up.  Return to the emergency department if any worsening or concerning symptoms.

## 2022-11-22 NOTE — Telephone Encounter (Signed)
Pt called in stated she tested positive for covid and want to know if she should start on medication. Would like a call back to discuss # [phone]

## 2022-11-23 ENCOUNTER — Encounter: Payer: Self-pay | Admitting: Family Medicine

## 2022-11-23 ENCOUNTER — Ambulatory Visit (INDEPENDENT_AMBULATORY_CARE_PROVIDER_SITE_OTHER): Payer: Medicare HMO | Admitting: Family Medicine

## 2022-11-23 VITALS — BP 118/60 | HR 72 | Temp 98.0°F | Ht 70.0 in | Wt 234.0 lb

## 2022-11-23 DIAGNOSIS — U071 COVID-19: Secondary | ICD-10-CM

## 2022-11-23 NOTE — Progress Notes (Signed)
F/u re: covid and Cr elevation.   She tested positive for covid 3 weeks ago.  Still feels diffusely weak and tired.  She can feel lightheaded.  No syncope.    She was prev on lisinopril, ie recently started.  She isn't on BP meds at this point, as of today.  She has tried to go back to work in the meantime.   Possible cardiomegaly on AP CXR.  Discussed that this could be a false positive given that it was an AP projection  Has been on augmentin for 2 days after urgent care evaluation.  Meds, vitals, and allergies reviewed.   ROS: Per HPI unless specifically indicated in ROS section   GEN: nad, alert and oriented HEENT: ncat NECK: supple w/o LA CV: rrr.  PULM: ctab, no inc wob ABD: soft, +bs EXT: no edema SKIN: Well-perfused.  Discussed possible creatinine elevation related to acute illness. 30 minutes were devoted to patient care in this encounter (this includes time spent reviewing the patient's file/history, interviewing and examining the patient, counseling/reviewing plan with patient).

## 2022-11-23 NOTE — Patient Instructions (Signed)
Consider echo after you feel better.    Stay off lisinopril for now.  Rest and fluids.  Finish augmentin.  Take care.  Glad to see you.

## 2022-11-26 NOTE — Assessment & Plan Note (Signed)
Relatively recent diagnosis of COVID, with presumed bacterial sinus infection as a secondary issue.  Recently with lisinopril start (now held with renal follow-up pending) in setting of chronic kidney disease (discussed with patient that lisinopril start makes sense for its protective effect) but with concurrent creatinine elevation that could have been exacerbated by recent illness. Discussed options. Stay off lisinopril for now.  Rest and fluids.  Finish augmentin.  Update me as needed. I will await renal clinic note/follow-up.  Discussed possible cardiomegaly.  Consider echo after feeling better.

## 2022-11-30 DIAGNOSIS — D539 Nutritional anemia, unspecified: Secondary | ICD-10-CM | POA: Diagnosis not present

## 2022-11-30 DIAGNOSIS — N200 Calculus of kidney: Secondary | ICD-10-CM | POA: Diagnosis not present

## 2022-11-30 DIAGNOSIS — I10 Essential (primary) hypertension: Secondary | ICD-10-CM | POA: Diagnosis not present

## 2022-11-30 DIAGNOSIS — I1310 Hypertensive heart and chronic kidney disease without heart failure, with stage 1 through stage 4 chronic kidney disease, or unspecified chronic kidney disease: Secondary | ICD-10-CM | POA: Diagnosis not present

## 2022-11-30 DIAGNOSIS — E2089 Other specified hypoparathyroidism: Secondary | ICD-10-CM | POA: Diagnosis not present

## 2022-11-30 DIAGNOSIS — E668 Other obesity: Secondary | ICD-10-CM | POA: Diagnosis not present

## 2022-12-03 ENCOUNTER — Telehealth: Payer: Self-pay | Admitting: Family Medicine

## 2022-12-03 DIAGNOSIS — I517 Cardiomegaly: Secondary | ICD-10-CM

## 2022-12-03 NOTE — Telephone Encounter (Signed)
Please check with patient and see if she feels well enough to go for an echo to evaluate for possible cardiomegaly that was seen on previous chest x-ray.  Either way let me know.  I have the order pended on this note in the meantime.  Thanks.

## 2022-12-04 NOTE — Telephone Encounter (Signed)
Pt called returning [place] call. Told pt Duncan's response. Pt stated she can do the echo. Call back # [phone]

## 2022-12-04 NOTE — Telephone Encounter (Signed)
Unable to reach patient. Left voicemail to return call to our office.   

## 2022-12-05 NOTE — Telephone Encounter (Signed)
Notified patient that order has been placed. Advised to call back if she doesn't get a call about scheduling.

## 2022-12-05 NOTE — Telephone Encounter (Signed)
Ordered. Thanks.  Have her update [place] if she doesn't get a call about scheduling.  Thanks.

## 2022-12-18 DIAGNOSIS — I1310 Hypertensive heart and chronic kidney disease without heart failure, with stage 1 through stage 4 chronic kidney disease, or unspecified chronic kidney disease: Secondary | ICD-10-CM | POA: Diagnosis not present

## 2022-12-18 DIAGNOSIS — N200 Calculus of kidney: Secondary | ICD-10-CM | POA: Diagnosis not present

## 2022-12-18 DIAGNOSIS — E668 Other obesity: Secondary | ICD-10-CM | POA: Diagnosis not present

## 2022-12-18 DIAGNOSIS — I10 Essential (primary) hypertension: Secondary | ICD-10-CM | POA: Diagnosis not present

## 2022-12-18 DIAGNOSIS — N2581 Secondary hyperparathyroidism of renal origin: Secondary | ICD-10-CM | POA: Diagnosis not present

## 2023-01-05 ENCOUNTER — Ambulatory Visit
Admission: RE | Admit: 2023-01-05 | Discharge: 2023-01-05 | Disposition: A | Discharge status: Home / Self Care | Payer: Medicare HMO | Source: Ambulatory Visit | Attending: Family Medicine | Admitting: Family Medicine

## 2023-01-05 DIAGNOSIS — N189 Chronic kidney disease, unspecified: Secondary | ICD-10-CM | POA: Diagnosis not present

## 2023-01-05 DIAGNOSIS — I129 Hypertensive chronic kidney disease with stage 1 through stage 4 chronic kidney disease, or unspecified chronic kidney disease: Secondary | ICD-10-CM | POA: Diagnosis not present

## 2023-01-05 DIAGNOSIS — I351 Nonrheumatic aortic (valve) insufficiency: Secondary | ICD-10-CM | POA: Diagnosis not present

## 2023-01-05 DIAGNOSIS — I517 Cardiomegaly: Secondary | ICD-10-CM | POA: Diagnosis not present

## 2023-01-05 LAB — ECHOCARDIOGRAM COMPLETE
AR max vel: 3.34 cm2
AV Area VTI: 3.57 cm2
AV Area mean vel: 3.25 cm2
AV Mean grad: 3 mmHg
AV Peak grad: 5.3 mmHg
Ao pk vel: 1.15 m/s
Area-P 1/2: 3.07 cm2
MV VTI: 2.23 cm2
S' Lateral: 3.9 cm

## 2023-01-05 NOTE — Progress Notes (Signed)
*  PRELIMINARY RESULTS* Echocardiogram 2D Echocardiogram has been performed.  Carmen Buchanan 01/05/2023, 11:04 AM

## 2023-01-22 ENCOUNTER — Telehealth: Payer: Self-pay | Admitting: Family Medicine

## 2023-01-22 NOTE — Telephone Encounter (Signed)
Contacted Shirlee Limerick to schedule their annual wellness visit. Appointment made for 01/24/2023.  [facility] Care Guide [facility] AWV TEAM Direct Dial: [phone]

## 2023-01-24 ENCOUNTER — Ambulatory Visit (INDEPENDENT_AMBULATORY_CARE_PROVIDER_SITE_OTHER): Payer: Medicare HMO

## 2023-01-24 VITALS — Ht 69.0 in | Wt 235.0 lb

## 2023-01-24 DIAGNOSIS — Z0000 Encounter for general adult medical examination without abnormal findings: Secondary | ICD-10-CM

## 2023-01-24 NOTE — Patient Instructions (Signed)
Carmen Buchanan , Thank you for taking time to come for your Medicare Wellness Visit. I appreciate your ongoing commitment to your health goals. Please review the following plan we discussed and let me know if I can assist you in the future.   These are the goals we discussed:  Goals   None     This is a list of the screening recommended for you and due dates:  Health Maintenance  Topic Date Due   Medicare Annual Wellness Visit  Never done   Zoster (Shingles) Vaccine (1 of 2) Never done   Pap Smear  02/18/2018   Mammogram  09/07/2021   Pneumonia Vaccine (1 of 1 - PCV) Never done   COVID-19 Vaccine (4 - 2023-24 season) 05/26/2022   Flu Shot  04/26/2023   DTaP/Tdap/Td vaccine (2 - Td or Tdap) 11/09/2029   Colon Cancer Screening  12/30/2029   DEXA scan (bone density measurement)  Completed   Hepatitis C Screening: USPSTF Recommendation to screen - Ages 80-79 yo.  Completed   HIV Screening  Completed   HPV Vaccine  Aged Out    Advanced directives: None  Conditions/risks identified: Aim for 30 minutes of exercise or brisk walking, 6-8 glasses of water, and 5 servings of fruits and vegetables each day.   Next appointment: Follow up in one year for your annual wellness visit 01/28/24 @ 9:30   Preventive Care 65 Years and Older, Female Preventive care refers to lifestyle choices and visits with your health care provider that can promote health and wellness. What does preventive care include? A yearly physical exam. This is also called an annual well check. Dental exams once or twice a year. Routine eye exams. Ask your health care provider how often you should have your eyes checked. Personal lifestyle choices, including: Daily care of your teeth and gums. Regular physical activity. Eating a healthy diet. Avoiding tobacco and drug use. Limiting alcohol use. Practicing safe sex. Taking low-dose aspirin every day. Taking vitamin and mineral supplements as recommended by your health  care provider. What happens during an annual well check? The services and screenings done by your health care provider during your annual well check will depend on your age, overall health, lifestyle risk factors, and family history of disease. Counseling  Your health care provider may ask you questions about your: Alcohol use. Tobacco use. Drug use. Emotional well-being. Home and relationship well-being. Sexual activity. Eating habits. History of falls. Memory and ability to understand (cognition). Work and work Astronomer. Reproductive health. Screening  You may have the following tests or measurements: Height, weight, and BMI. Blood pressure. Lipid and cholesterol levels. These may be checked every 5 years, or more frequently if you are over 41 years old. Skin check. Lung cancer screening. You may have this screening every year starting at age 53 if you have a 30-pack-year history of smoking and currently smoke or have quit within the past 15 years. Fecal occult blood test (FOBT) of the stool. You may have this test every year starting at age 53. Flexible sigmoidoscopy or colonoscopy. You may have a sigmoidoscopy every 5 years or a colonoscopy every 10 years starting at age 76. Hepatitis C blood test. Hepatitis B blood test. Sexually transmitted disease (STD) testing. Diabetes screening. This is done by checking your blood sugar (glucose) after you have not eaten for a while (fasting). You may have this done every 1-3 years. Bone density scan. This is done to screen for osteoporosis. You may have  this done starting at age 58. Mammogram. This may be done every 1-2 years. Talk to your health care provider about how often you should have regular mammograms. Talk with your health care provider about your test results, treatment options, and if necessary, the need for more tests. Vaccines  Your health care provider may recommend certain vaccines, such as: Influenza vaccine. This is  recommended every year. Tetanus, diphtheria, and acellular pertussis (Tdap, Td) vaccine. You may need a Td booster every 10 years. Zoster vaccine. You may need this after age 61. Pneumococcal 13-valent conjugate (PCV13) vaccine. One dose is recommended after age 23. Pneumococcal polysaccharide (PPSV23) vaccine. One dose is recommended after age 51. Talk to your health care provider about which screenings and vaccines you need and how often you need them. This information is not intended to replace advice given to you by your health care provider. Make sure you discuss any questions you have with your health care provider. Document Released: 10/08/2015 Document Revised: 05/31/2016 Document Reviewed: 07/13/2015 Elsevier Interactive Patient Education  2017 [place] Prevention in the Home Falls can cause injuries. They can happen to people of all ages. There are many things you can do to make your home safe and to help prevent falls. What can I do on the outside of my home? Regularly fix the edges of walkways and driveways and fix any cracks. Remove anything that might make you trip as you walk through a door, such as a raised step or threshold. Trim any bushes or trees on the path to your home. Use bright outdoor lighting. Clear any walking paths of anything that might make someone trip, such as rocks or tools. Regularly check to see if handrails are loose or broken. Make sure that both sides of any steps have handrails. Any raised decks and porches should have guardrails on the edges. Have any leaves, snow, or ice cleared regularly. Use sand or salt on walking paths during winter. Clean up any spills in your garage right away. This includes oil or grease spills. What can I do in the bathroom? Use night lights. Install grab bars by the toilet and in the tub and shower. Do not use towel bars as grab bars. Use non-skid mats or decals in the tub or shower. If you need to sit down in  the shower, use a plastic, non-slip stool. Keep the floor dry. Clean up any water that spills on the floor as soon as it happens. Remove soap buildup in the tub or shower regularly. Attach bath mats securely with double-sided non-slip rug tape. Do not have throw rugs and other things on the floor that can make you trip. What can I do in the bedroom? Use night lights. Make sure that you have a light by your bed that is easy to reach. Do not use any sheets or blankets that are too big for your bed. They should not hang down onto the floor. Have a firm chair that has side arms. You can use this for support while you get dressed. Do not have throw rugs and other things on the floor that can make you trip. What can I do in the kitchen? Clean up any spills right away. Avoid walking on wet floors. Keep items that you use a lot in easy-to-reach places. If you need to reach something above you, use a strong step stool that has a grab bar. Keep electrical cords out of the way. Do not use floor polish or  wax that makes floors slippery. If you must use wax, use non-skid floor wax. Do not have throw rugs and other things on the floor that can make you trip. What can I do with my stairs? Do not leave any items on the stairs. Make sure that there are handrails on both sides of the stairs and use them. Fix handrails that are broken or loose. Make sure that handrails are as long as the stairways. Check any carpeting to make sure that it is firmly attached to the stairs. Fix any carpet that is loose or worn. Avoid having throw rugs at the top or bottom of the stairs. If you do have throw rugs, attach them to the floor with carpet tape. Make sure that you have a light switch at the top of the stairs and the bottom of the stairs. If you do not have them, ask someone to add them for you. What else can I do to help prevent falls? Wear shoes that: Do not have high heels. Have rubber bottoms. Are comfortable  and fit you well. Are closed at the toe. Do not wear sandals. If you use a stepladder: Make sure that it is fully opened. Do not climb a closed stepladder. Make sure that both sides of the stepladder are locked into place. Ask someone to hold it for you, if possible. Clearly mark and make sure that you can see: Any grab bars or handrails. First and last steps. Where the edge of each step is. Use tools that help you move around (mobility aids) if they are needed. These include: Canes. Walkers. Scooters. Crutches. Turn on the lights when you go into a dark area. Replace any light bulbs as soon as they burn out. Set up your furniture so you have a clear path. Avoid moving your furniture around. If any of your floors are uneven, fix them. If there are any pets around you, be aware of where they are. Review your medicines with your doctor. Some medicines can make you feel dizzy. This can increase your chance of falling. Ask your doctor what other things that you can do to help prevent falls. This information is not intended to replace advice given to you by your health care provider. Make sure you discuss any questions you have with your health care provider. Document Released: 07/08/2009 Document Revised: 02/17/2016 Document Reviewed: 10/16/2014 Elsevier Interactive Patient Education  2017 [employer].

## 2023-01-24 NOTE — Progress Notes (Signed)
I connected with  Shirlee Limerick on 01/24/23 by a audio enabled telemedicine application and verified that I am speaking with the correct person using two identifiers.  Patient Location: Home  Provider Location: Office/Clinic  I discussed the limitations of evaluation and management by telemedicine. The patient expressed understanding and agreed to proceed.  Subjective:   LAILANA SHIRA is a 67 y.o. female who presents for Medicare Annual (Subsequent) preventive examination.  Review of Systems     Cardiac Risk Factors include: advanced age (>5men, >40 women);hypertension;sedentary lifestyle     Objective:    Today's Vitals   01/24/23 0904  Weight: 235 lb (106.6 kg)  Height: 5\' 9"  (1.753 m)  PainSc: 3    Body mass index is 34.7 kg/m.     01/24/2023    9:13 AM 11/21/2022    2:26 PM 02/28/2022   10:21 AM 02/17/2021    6:00 PM 07/16/2013    8:00 AM 07/14/2013    3:54 PM 07/14/2013   10:15 AM  Advanced Directives  Does Patient Have a Medical Advance Directive? No No No No Patient does not have advance directive Patient does not have advance directive Patient does not have advance directive;Patient would not like information  Would patient like information on creating a medical advance directive? Yes (ED - Information included in AVS)  No - Patient declined No - Patient declined     Pre-existing out of facility DNR order (yellow form or pink MOST form)       No    Current Medications (verified) Outpatient Encounter Medications as of 01/24/2023  Medication Sig   buPROPion (WELLBUTRIN SR) 150 MG 12 hr tablet TAKE 1 TABLET BY MOUTH TWICE A DAY   FLUoxetine (PROZAC) 40 MG capsule TAKE 2 CAPSULES BY MOUTH EVERY DAY   lisinopril (ZESTRIL) 5 MG tablet    LORazepam (ATIVAN) 0.5 MG tablet Take 1 tablet (0.5 mg total) by mouth daily as needed.   omeprazole (PRILOSEC) 20 MG capsule Take 1 capsule (20 mg total) by mouth in the morning and at bedtime.   polyethylene glycol (MIRALAX /  GLYCOLAX) packet Take 17 g by mouth daily.   amoxicillin-clavulanate (AUGMENTIN) 875-125 MG tablet Take 1 tablet by mouth every 12 (twelve) hours.   No facility-administered encounter medications on file as of 01/24/2023.    Allergies (verified) Ciprofloxacin, Latex, Morphine, and Nsaids   History: Past Medical History:  Diagnosis Date   Anemia    past hx since bariatric surgery 2012- Iron infusion last 09-2019   Anxiety    much improved after weight loss started in late 2012   Arthritis    CKD (chronic kidney disease) stage 3, GFR 30-59 ml/min (HCC)    Complication of anesthesia    after hemorrhoid surgery-inability to sleep for 3 weeks   GERD (gastroesophageal reflux disease)    Hemorrhoid    HTN (hypertension)    stopped meds after bariatric surgery   IBS (irritable bowel syndrome)    intermittent   Migraine, unspecified, without mention of intractable migraine without mention of status migrainosus    Obesity    Renal stones    Past Surgical History:  Procedure Laterality Date   ABLATION     uterus   CESAREAN SECTION     CYSTOSCOPY N/A 07/14/2013   Procedure: CYSTOSCOPY;  Surgeon: Sebastian Ache, MD;  Location: [facility];  Service: Urology;  Laterality: N/A;   GASTRIC BYPASS     sleeve procedure 06/2011 in [facility]  Gastro Mini Bypass  2012   HEMORRHOID SURGERY  2013   LASER ABLATION CONDYLOMA CERVICAL / VULVAR  5/04   uncontrollable bleeding   LUMBAR DISC SURGERY  7/01   L4-5; Dr. Elesa Hacker   NEPHROLITHOTOMY Right 07/14/2013   Procedure: NEPHROLITHOTOMY PERCUTANEOUS  Procedure: Right 1st stage Percutaneous Nephrostolithotomy with surgeon access and cystoscopy;  Surgeon: Sebastian Ache, MD;  Location: [facility];  Service: Urology;  Laterality: Right;   NEPHROLITHOTOMY Right 07/16/2013   Procedure: NEPHROLITHOTOMY PERCUTANEOUS SECOND LOOK, diagnostic ureteroscopy and stent placement on the right ;  Surgeon: Sebastian Ache, MD;  Location: [facility];  Service: Urology;   Laterality: Right;   TONSILLECTOMY AND ADENOIDECTOMY  1970   Family History  Problem Relation Age of Onset   Coronary artery disease Father        MI; CABG   Colon polyps Father    Aortic aneurysm Mother        on beta blocker   Other Mother        Cardiovascualr disease   Ulcers Mother    Colon polyps Mother    Heart disease Mother    Colon polyps Other        Family history   Colon cancer Neg Hx    Breast cancer Neg Hx    Esophageal cancer Neg Hx    Rectal cancer Neg Hx    Stomach cancer Neg Hx    Social History   Socioeconomic History   Marital status: Married    Spouse name: Not on file   Number of children: 1   Years of education: Not on file   Highest education level: Not on file  Occupational History   Occupation: Sales executive  Tobacco Use   Smoking status: Never   Smokeless tobacco: Never  Vaping Use   Vaping Use: Never used  Substance and Sexual Activity   Alcohol use: Not Currently   Drug use: No   Sexual activity: Not on file  Other Topics Concern   Not on file  Social History Narrative   Married; 1982   1 son   Sales executive in [employer]   Likes to [employer], church, time with family   Minimal exercise   Social Determinants of Health   Financial Resource Strain: Low Risk  (01/24/2023)   Overall Financial Resource Strain (CARDIA)    Difficulty of Paying Living Expenses: Not hard at all  Food Insecurity: No Food Insecurity (01/24/2023)   Hunger Vital Sign    Worried About Running Out of Food in the Last Year: Never true    Ran Out of Food in the Last Year: Never true  Transportation Needs: No Transportation Needs (01/24/2023)   PRAPARE - Administrator, Civil Service (Medical): No    Lack of Transportation (Non-Medical): No  Physical Activity: Insufficiently Active (01/24/2023)   Exercise Vital Sign    Days of Exercise per Week: 4 days    Minutes of Exercise per Session: 10 min  Stress: No Stress Concern Present (01/24/2023)   [employer] of Occupational Health - Occupational Stress Questionnaire    Feeling of Stress : Only a little  Social Connections: Socially Integrated (01/24/2023)   Social Connection and Isolation Panel [NHANES]    Frequency of Communication with Friends and Family: More than three times a week    Frequency of Social Gatherings with Friends and Family: More than three times a week    Attends Religious Services: More than 4 times per  year    Active Member of Clubs or Organizations: Yes    Attends Engineer, structural: More than 4 times per year    Marital Status: Married    Tobacco Counseling Counseling given: Not Answered   Clinical Intake:  Pre-visit preparation completed: Yes  Pain : 0-10 Pain Score: 3  Pain Type: Acute pain Pain Location: Generalized Pain Descriptors / Indicators: Aching Pain Onset: In the past 7 days Pain Frequency: Occasional     Nutritional Risks: None Diabetes: No  How often do you need to have someone help you when you read instructions, pamphlets, or other written materials from your doctor or pharmacy?: 1 - Never  Diabetic?no  Interpreter Needed?: No  Comments: married Information entered by :: Dorene Grebe CMA   Activities of Daily Living    01/24/2023    9:15 AM  In your present state of health, do you have any difficulty performing the following activities:  Hearing? 0  Vision? 0  Difficulty concentrating or making decisions? 1  Walking or climbing stairs? 0  Comment knee problems sometimes difficult  Dressing or bathing? 0  Doing errands, shopping? 0  Preparing Food and eating ? N  Using the Toilet? N  In the past six months, have you accidently leaked urine? Y  Comment occasional  Do you have problems with loss of bowel control? N  Managing your Medications? N  Managing your Finances? N  Housekeeping or managing your Housekeeping? N    Patient Care Team: Joaquim Nam, MD as PCP - Vern Claude, MD as  Consulting Physician (Obstetrics and Gynecology)  Indicate any recent Medical Services you may have received from other than [facility] providers in the past year (date may be approximate).     Assessment:   This is a routine wellness examination for Zeynab.  Hearing/Vision screen Hearing Screening - Comments:: Adequate hearing Vision Screening - Comments:: Wears glasses- [facility]  Dietary issues and exercise activities discussed: Current Exercise Habits: The patient does not participate in regular exercise at present   Goals Addressed   None    Depression Screen    01/24/2023    9:28 AM 01/24/2023    9:10 AM 11/23/2022    3:29 PM  PHQ 2/9 Scores  PHQ - 2 Score 2 2 4   PHQ- 9 Score 2 2 10     Fall Risk    01/24/2023    9:15 AM 11/23/2022    3:28 PM  Fall Risk   Falls in the past year? 0 0  Number falls in past yr: 0 0  Injury with Fall? 0 0  Risk for fall due to : No Fall Risks No Fall Risks  Follow up Falls evaluation completed;Education provided;Falls prevention discussed Falls evaluation completed    FALL RISK PREVENTION PERTAINING TO THE HOME:  Any stairs in or around the home? No  If so, are there any without handrails? No  Home free of loose throw rugs in walkways, pet beds, electrical cords, etc? Yes  Adequate lighting in your home to reduce risk of falls? Yes   ASSISTIVE DEVICES UTILIZED TO PREVENT FALLS:  Life alert? No  Use of a cane, walker or w/c? No  Grab bars in the bathroom? No  Shower chair or bench in shower? Yes  Elevated toilet seat or a handicapped toilet? No    Cognitive Function:        01/24/2023    9:17 AM  6CIT Screen  What Year?  0 points  What month? 0 points  What time? 0 points  Count back from 20 0 points  Months in reverse 0 points  Repeat phrase 0 points  Total Score 0 points    Immunizations Immunization History  Administered Date(s) Administered   Fluad Quad(high Dose 65+) 08/07/2022   Influenza,inj,Quad PF,6+ Mos  07/17/2013, 11/10/2019, 07/08/2020   Moderna Sars-Covid-2 Vaccination 12/09/2019, 01/08/2020, 07/25/2020   Tdap 11/10/2019    TDAP status: Up to date  Flu Vaccine status: Up to date  Pneumococcal vaccine status: Due, Education has been provided regarding the importance of this vaccine. Advised may receive this vaccine at local pharmacy or Health Dept. Aware to provide a copy of the vaccination record if obtained from local pharmacy or Health Dept. Verbalized acceptance and understanding.  Covid-19 vaccine status: Information provided on how to obtain vaccines.   Qualifies for Shingles Vaccine? Yes   Zostavax completed No   Shingrix Completed?: No.    Education has been provided regarding the importance of this vaccine. Patient has been advised to call insurance company to determine out of pocket expense if they have not yet received this vaccine. Advised may also receive vaccine at local pharmacy or Health Dept. Verbalized acceptance and understanding.  Screening Tests Health Maintenance  Topic Date Due   Zoster Vaccines- Shingrix (1 of 2) Never done   PAP SMEAR-Modifier  02/18/2018   MAMMOGRAM  09/07/2021   Pneumonia Vaccine 56+ Years old (1 of 1 - PCV) Never done   COVID-19 Vaccine (4 - 2023-24 season) 05/26/2022   INFLUENZA VACCINE  04/26/2023   Medicare Annual Wellness (AWV)  01/24/2024   DTaP/Tdap/Td (2 - Td or Tdap) 11/09/2029   COLONOSCOPY (Pts 45-31yrs Insurance coverage will need to be confirmed)  12/30/2029   DEXA SCAN  Completed   Hepatitis C Screening  Completed   HIV Screening  Completed   HPV VACCINES  Aged Out    Health Maintenance  Health Maintenance Due  Topic Date Due   Zoster Vaccines- Shingrix (1 of 2) Never done   PAP SMEAR-Modifier  02/18/2018   MAMMOGRAM  09/07/2021   Pneumonia Vaccine 43+ Years old (1 of 1 - PCV) Never done   COVID-19 Vaccine (4 - 2023-24 season) 05/26/2022    Colorectal cancer screening: Type of screening: Colonoscopy. Completed  12/31/19. Repeat every 10 years  Mammogram status: Completed 102023. Repeat every year receives at OBGYN  Bone Density status: Completed yes. Results reflect: Bone density results: NORMAL. Repeat every 2 years.  Lung Cancer Screening: (Low Dose CT Chest recommended if Age 85-80 years, 30 pack-year currently smoking OR have quit w/in 15years.) does not qualify.   Lung Cancer Screening Referral: no  Additional Screening:  Hepatitis C Screening: does not qualify; Completed 09/25/08  Vision Screening: Recommended annual ophthalmology exams for early detection of glaucoma and other disorders of the eye. Is the patient up to date with their annual eye exam?  Yes  Who is the provider or what is the name of the office in which the patient attends annual eye exams? [facility] If pt is not established with a provider, would they like to be referred to a provider to establish care? No .   Dental Screening: Recommended annual dental exams for proper oral hygiene  Community Resource Referral / Chronic Care Management: CRR required this visit?  No   CCM required this visit?  No      Plan:     I have personally reviewed and  noted the following in the patient's chart:   Medical and social history Use of alcohol, tobacco or illicit drugs  Current medications and supplements including opioid prescriptions. Patient is not currently taking opioid prescriptions. Functional ability and status Nutritional status Physical activity Advanced directives List of other physicians Hospitalizations, surgeries, and ER visits in previous 12 months Vitals Screenings to include cognitive, depression, and falls Referrals and appointments  In addition, I have reviewed and discussed with patient certain preventive protocols, quality metrics, and best practice recommendations. A written personalized care plan for preventive services as well as general preventive health recommendations were provided to  patient.     Maryan Puls, LPN   09/30/1094   Nurse Notes: None

## 2023-02-15 DIAGNOSIS — K219 Gastro-esophageal reflux disease without esophagitis: Secondary | ICD-10-CM | POA: Diagnosis not present

## 2023-02-15 DIAGNOSIS — N1832 Chronic kidney disease, stage 3b: Secondary | ICD-10-CM | POA: Diagnosis not present

## 2023-02-15 DIAGNOSIS — I1310 Hypertensive heart and chronic kidney disease without heart failure, with stage 1 through stage 4 chronic kidney disease, or unspecified chronic kidney disease: Secondary | ICD-10-CM | POA: Diagnosis not present

## 2023-02-15 DIAGNOSIS — N2581 Secondary hyperparathyroidism of renal origin: Secondary | ICD-10-CM | POA: Diagnosis not present

## 2023-02-15 DIAGNOSIS — E668 Other obesity: Secondary | ICD-10-CM | POA: Diagnosis not present

## 2023-02-15 DIAGNOSIS — E2089 Other specified hypoparathyroidism: Secondary | ICD-10-CM | POA: Diagnosis not present

## 2023-02-15 DIAGNOSIS — R8290 Unspecified abnormal findings in urine: Secondary | ICD-10-CM | POA: Diagnosis not present

## 2023-02-15 DIAGNOSIS — N200 Calculus of kidney: Secondary | ICD-10-CM | POA: Diagnosis not present

## 2023-02-15 DIAGNOSIS — I10 Essential (primary) hypertension: Secondary | ICD-10-CM | POA: Diagnosis not present

## 2023-02-26 DIAGNOSIS — I1310 Hypertensive heart and chronic kidney disease without heart failure, with stage 1 through stage 4 chronic kidney disease, or unspecified chronic kidney disease: Secondary | ICD-10-CM | POA: Diagnosis not present

## 2023-02-26 DIAGNOSIS — E2089 Other specified hypoparathyroidism: Secondary | ICD-10-CM | POA: Diagnosis not present

## 2023-02-26 DIAGNOSIS — I10 Essential (primary) hypertension: Secondary | ICD-10-CM | POA: Diagnosis not present

## 2023-02-26 DIAGNOSIS — N200 Calculus of kidney: Secondary | ICD-10-CM | POA: Diagnosis not present

## 2023-02-26 DIAGNOSIS — N1832 Chronic kidney disease, stage 3b: Secondary | ICD-10-CM | POA: Diagnosis not present

## 2023-02-26 DIAGNOSIS — Z9884 Bariatric surgery status: Secondary | ICD-10-CM | POA: Diagnosis not present

## 2023-03-01 ENCOUNTER — Other Ambulatory Visit: Payer: Self-pay | Admitting: Nurse Practitioner

## 2023-03-01 DIAGNOSIS — N200 Calculus of kidney: Secondary | ICD-10-CM

## 2023-03-01 DIAGNOSIS — I1310 Hypertensive heart and chronic kidney disease without heart failure, with stage 1 through stage 4 chronic kidney disease, or unspecified chronic kidney disease: Secondary | ICD-10-CM

## 2023-03-19 ENCOUNTER — Ambulatory Visit
Admission: RE | Admit: 2023-03-19 | Discharge: 2023-03-19 | Disposition: A | Discharge status: Home / Self Care | Payer: Medicare HMO | Source: Ambulatory Visit | Attending: Nurse Practitioner | Admitting: Nurse Practitioner

## 2023-03-19 DIAGNOSIS — N200 Calculus of kidney: Secondary | ICD-10-CM | POA: Diagnosis not present

## 2023-03-19 DIAGNOSIS — I1310 Hypertensive heart and chronic kidney disease without heart failure, with stage 1 through stage 4 chronic kidney disease, or unspecified chronic kidney disease: Secondary | ICD-10-CM | POA: Diagnosis not present

## 2023-03-19 DIAGNOSIS — N189 Chronic kidney disease, unspecified: Secondary | ICD-10-CM | POA: Diagnosis not present

## 2023-03-22 DIAGNOSIS — E2089 Other specified hypoparathyroidism: Secondary | ICD-10-CM | POA: Diagnosis not present

## 2023-03-22 DIAGNOSIS — I10 Essential (primary) hypertension: Secondary | ICD-10-CM | POA: Diagnosis not present

## 2023-03-22 DIAGNOSIS — I1310 Hypertensive heart and chronic kidney disease without heart failure, with stage 1 through stage 4 chronic kidney disease, or unspecified chronic kidney disease: Secondary | ICD-10-CM | POA: Diagnosis not present

## 2023-03-22 DIAGNOSIS — N200 Calculus of kidney: Secondary | ICD-10-CM | POA: Diagnosis not present

## 2023-03-22 DIAGNOSIS — Z9884 Bariatric surgery status: Secondary | ICD-10-CM | POA: Diagnosis not present

## 2023-03-26 ENCOUNTER — Telehealth: Payer: Medicare HMO | Admitting: Physician Assistant

## 2023-03-26 DIAGNOSIS — R3989 Other symptoms and signs involving the genitourinary system: Secondary | ICD-10-CM | POA: Diagnosis not present

## 2023-03-26 MED ORDER — CEPHALEXIN 500 MG PO CAPS: 500.0000 mg | ORAL_CAPSULE | Freq: Two times a day (BID) | ORAL | 0 refills | Status: DC

## 2023-03-26 NOTE — Progress Notes (Signed)

## 2023-04-02 ENCOUNTER — Telehealth: Payer: Medicare HMO | Admitting: Family Medicine

## 2023-04-02 DIAGNOSIS — T3695XA Adverse effect of unspecified systemic antibiotic, initial encounter: Secondary | ICD-10-CM

## 2023-04-02 DIAGNOSIS — B379 Candidiasis, unspecified: Secondary | ICD-10-CM | POA: Diagnosis not present

## 2023-04-02 MED ORDER — FLUCONAZOLE 150 MG PO TABS: 150.0000 mg | ORAL_TABLET | Freq: Once | ORAL | 0 refills | Status: AC

## 2023-04-02 NOTE — Progress Notes (Signed)

## 2023-04-05 ENCOUNTER — Ambulatory Visit (INDEPENDENT_AMBULATORY_CARE_PROVIDER_SITE_OTHER): Payer: Medicare HMO | Admitting: Family Medicine

## 2023-04-05 ENCOUNTER — Encounter: Payer: Self-pay | Admitting: Family Medicine

## 2023-04-05 VITALS — BP 126/62 | HR 73 | Temp 98.2°F | Ht 69.0 in | Wt 233.0 lb

## 2023-04-05 DIAGNOSIS — R21 Rash and other nonspecific skin eruption: Secondary | ICD-10-CM

## 2023-04-05 DIAGNOSIS — N189 Chronic kidney disease, unspecified: Secondary | ICD-10-CM | POA: Diagnosis not present

## 2023-04-05 MED ORDER — FLUCONAZOLE 150 MG PO TABS: 150.0000 mg | ORAL_TABLET | ORAL | 0 refills | Status: DC

## 2023-04-05 NOTE — Patient Instructions (Addendum)
If you continue to have UTIs, then stop jardiance and update renal.   Take care.  Glad to see you. Fluconazole weekly.

## 2023-04-05 NOTE — Progress Notes (Signed)
She is off lisinopril per renal.  She finishes keflex rx today.  She is on Jardiance, cautions d/w pt.  Burning with urination improved in the meantime.  UTI cautions related to Jardiance discussed with patient.  She was considering options, about what to do if her renal function continued to decline.  She "is in a better place now" after thinking about options.  We talked about peritoneal dialysis versus hemodialysis versus transplant considerations.  Discussed that patients who are candidates for the transplant service, who then successfully complete the transplant protocol, tend to have better outcomes.  Also discussed that the main goal is for her not to need dialysis or transplant at all.  Rash in skin folds.  Itchy.  Present for about 3-4 weeks.  Improved with oral fluconazole x1 dose.    Meds, vitals, and allergies reviewed.   ROS: Per HPI unless specifically indicated in ROS section   Nad Ncat Rrr Has a superficial fungal rash on the R groin.   Skin well-perfused.  30 minutes were devoted to patient care in this encounter (this includes time spent reviewing the patient's file/history, interviewing and examining the patient, counseling/reviewing plan with patient).

## 2023-04-07 DIAGNOSIS — R21 Rash and other nonspecific skin eruption: Secondary | ICD-10-CM | POA: Insufficient documentation

## 2023-04-07 NOTE — Assessment & Plan Note (Signed)
Discussed that if she continues to have UTIs, then stop jardiance and update renal.   See above regarding renal replacement discussion.  The goal is not to need that at all.

## 2023-04-07 NOTE — Assessment & Plan Note (Signed)
Superficial fungal infection.  Can use fluconazole weekly and then update me as needed.  She agrees to plan.

## 2023-04-18 DIAGNOSIS — I1310 Hypertensive heart and chronic kidney disease without heart failure, with stage 1 through stage 4 chronic kidney disease, or unspecified chronic kidney disease: Secondary | ICD-10-CM | POA: Diagnosis not present

## 2023-04-18 DIAGNOSIS — N200 Calculus of kidney: Secondary | ICD-10-CM | POA: Diagnosis not present

## 2023-04-18 DIAGNOSIS — I10 Essential (primary) hypertension: Secondary | ICD-10-CM | POA: Diagnosis not present

## 2023-04-18 DIAGNOSIS — E2089 Other specified hypoparathyroidism: Secondary | ICD-10-CM | POA: Diagnosis not present

## 2023-04-18 DIAGNOSIS — E668 Other obesity: Secondary | ICD-10-CM | POA: Diagnosis not present

## 2023-04-26 DIAGNOSIS — I1310 Hypertensive heart and chronic kidney disease without heart failure, with stage 1 through stage 4 chronic kidney disease, or unspecified chronic kidney disease: Secondary | ICD-10-CM | POA: Diagnosis not present

## 2023-04-26 DIAGNOSIS — N184 Chronic kidney disease, stage 4 (severe): Secondary | ICD-10-CM | POA: Diagnosis not present

## 2023-04-26 DIAGNOSIS — N2581 Secondary hyperparathyroidism of renal origin: Secondary | ICD-10-CM | POA: Diagnosis not present

## 2023-04-29 ENCOUNTER — Other Ambulatory Visit: Payer: Self-pay | Admitting: Family Medicine

## 2023-05-11 ENCOUNTER — Telehealth: Payer: Medicare HMO | Admitting: Nurse Practitioner

## 2023-05-11 DIAGNOSIS — R3989 Other symptoms and signs involving the genitourinary system: Secondary | ICD-10-CM

## 2023-05-11 NOTE — Progress Notes (Signed)
Neah,  I apologize for the inconvenience but due to your history and recent UTI we need you to be seen in person so you can have urine testing and cultures performed. We are unable to order labs at this time.   With recurrent or complicated UTIs it is important to send out labs to check for antibiotics susceptibility and resistance to assure you receive the best treatment plan.    I feel your condition warrants further evaluation and I recommend that you be seen for a face to face visit.  Please contact your primary care physician practice to be seen. Many offices offer virtual options to be seen via video if you are not comfortable going in person to a medical facility at this time.  NOTE: You will NOT be charged for this eVisit.  If you do not have a PCP,  offers a free physician referral service available at [phone]. Our trained staff has the experience, knowledge and resources to put you in touch with a physician who is right for you.    If you are having a true medical emergency please call 911.   Your e-visit answers were reviewed by a board certified advanced clinical practitioner to complete your personal care plan.  Thank you for using e-Visits.

## 2023-05-14 ENCOUNTER — Encounter: Payer: Self-pay | Admitting: Family Medicine

## 2023-05-14 ENCOUNTER — Ambulatory Visit (INDEPENDENT_AMBULATORY_CARE_PROVIDER_SITE_OTHER): Payer: Medicare HMO | Admitting: Family Medicine

## 2023-05-14 VITALS — BP 110/60 | HR 76 | Temp 97.9°F | Ht 69.0 in | Wt 235.0 lb

## 2023-05-14 DIAGNOSIS — N189 Chronic kidney disease, unspecified: Secondary | ICD-10-CM | POA: Diagnosis not present

## 2023-05-14 DIAGNOSIS — R3989 Other symptoms and signs involving the genitourinary system: Secondary | ICD-10-CM | POA: Diagnosis not present

## 2023-05-14 DIAGNOSIS — M25569 Pain in unspecified knee: Secondary | ICD-10-CM

## 2023-05-14 LAB — POC URINALSYSI DIPSTICK (AUTOMATED)
Bilirubin, UA: NEGATIVE
Blood, UA: NEGATIVE
Glucose, UA: NEGATIVE
Ketones, UA: NEGATIVE
Leukocytes, UA: NEGATIVE
Nitrite, UA: NEGATIVE
Protein, UA: POSITIVE — AB
Spec Grav, UA: 1.025 (ref 1.010–1.025)
Urobilinogen, UA: 0.2 E.U./dL
pH, UA: 5.5 (ref 5.0–8.0)

## 2023-05-14 NOTE — Patient Instructions (Addendum)
We'll update you about your urine culture.   Drink plenty of water in the meantime.  Take care.  Glad to see you. Try icing your knee for 5 minutes at a time.

## 2023-05-14 NOTE — Progress Notes (Unsigned)
Patient has been having urgency and some off and on burning since last week. She states she really hasn't been right since her last UTI about a month ago.  She had used diflucan prev, most recently this weekend.   abdominal pain: no  fevers: no back pain: intermittent vomiting: no  No discharge.  No blood seen in urine.   D/w pt about improvement in recent Cr level.    She had episodic flare of L knee pain recently, some better with relative rest.  Medial to the inferior L kneecap.    Meds, vitals, and allergies reviewed.   Per HPI unless specifically indicated in ROS section   GEN: nad, alert and oriented HEENT: ncat NECK: supple CV: rrr.  PULM: ctab, no inc wob ABD: soft, +bs, suprapubic area not tender EXT: no edema SKIN: well perfused.   L knee with crepitus on ROM.

## 2023-05-15 LAB — URINE CULTURE
MICRO NUMBER:: 15349281
SPECIMEN QUALITY:: ADEQUATE

## 2023-05-16 DIAGNOSIS — M25569 Pain in unspecified knee: Secondary | ICD-10-CM | POA: Insufficient documentation

## 2023-05-16 DIAGNOSIS — R3989 Other symptoms and signs involving the genitourinary system: Secondary | ICD-10-CM | POA: Insufficient documentation

## 2023-05-16 NOTE — Assessment & Plan Note (Signed)
At this point still okay for outpatient follow-up.  Check urine culture first.  She agreed.  See notes on labs.

## 2023-05-16 NOTE — Assessment & Plan Note (Signed)
Presumed osteoarthritis given the crepitus on range of motion.  Discussed icing.  Would avoid NSAIDs.

## 2023-05-16 NOTE — Assessment & Plan Note (Signed)
See above.  Per renal.

## 2023-05-21 ENCOUNTER — Ambulatory Visit: Payer: Medicare HMO | Admitting: Family Medicine

## 2023-06-07 DIAGNOSIS — N184 Chronic kidney disease, stage 4 (severe): Secondary | ICD-10-CM | POA: Diagnosis not present

## 2023-06-07 DIAGNOSIS — I1310 Hypertensive heart and chronic kidney disease without heart failure, with stage 1 through stage 4 chronic kidney disease, or unspecified chronic kidney disease: Secondary | ICD-10-CM | POA: Diagnosis not present

## 2023-06-07 DIAGNOSIS — N2581 Secondary hyperparathyroidism of renal origin: Secondary | ICD-10-CM | POA: Diagnosis not present

## 2023-06-12 DIAGNOSIS — E2089 Other specified hypoparathyroidism: Secondary | ICD-10-CM | POA: Diagnosis not present

## 2023-06-12 DIAGNOSIS — N200 Calculus of kidney: Secondary | ICD-10-CM | POA: Diagnosis not present

## 2023-06-12 DIAGNOSIS — I1310 Hypertensive heart and chronic kidney disease without heart failure, with stage 1 through stage 4 chronic kidney disease, or unspecified chronic kidney disease: Secondary | ICD-10-CM | POA: Diagnosis not present

## 2023-06-12 DIAGNOSIS — R8290 Unspecified abnormal findings in urine: Secondary | ICD-10-CM | POA: Diagnosis not present

## 2023-06-12 DIAGNOSIS — N2581 Secondary hyperparathyroidism of renal origin: Secondary | ICD-10-CM | POA: Diagnosis not present

## 2023-06-12 DIAGNOSIS — I10 Essential (primary) hypertension: Secondary | ICD-10-CM | POA: Diagnosis not present

## 2023-06-12 DIAGNOSIS — N184 Chronic kidney disease, stage 4 (severe): Secondary | ICD-10-CM | POA: Diagnosis not present

## 2023-06-12 DIAGNOSIS — E668 Other obesity: Secondary | ICD-10-CM | POA: Diagnosis not present

## 2023-06-14 DIAGNOSIS — D631 Anemia in chronic kidney disease: Secondary | ICD-10-CM | POA: Diagnosis not present

## 2023-06-14 DIAGNOSIS — I1310 Hypertensive heart and chronic kidney disease without heart failure, with stage 1 through stage 4 chronic kidney disease, or unspecified chronic kidney disease: Secondary | ICD-10-CM | POA: Diagnosis not present

## 2023-06-14 DIAGNOSIS — N179 Acute kidney failure, unspecified: Secondary | ICD-10-CM | POA: Diagnosis not present

## 2023-06-14 DIAGNOSIS — N2581 Secondary hyperparathyroidism of renal origin: Secondary | ICD-10-CM | POA: Diagnosis not present

## 2023-06-18 DIAGNOSIS — N2581 Secondary hyperparathyroidism of renal origin: Secondary | ICD-10-CM | POA: Diagnosis not present

## 2023-06-18 DIAGNOSIS — I1310 Hypertensive heart and chronic kidney disease without heart failure, with stage 1 through stage 4 chronic kidney disease, or unspecified chronic kidney disease: Secondary | ICD-10-CM | POA: Diagnosis not present

## 2023-06-18 DIAGNOSIS — N179 Acute kidney failure, unspecified: Secondary | ICD-10-CM | POA: Diagnosis not present

## 2023-06-18 DIAGNOSIS — D631 Anemia in chronic kidney disease: Secondary | ICD-10-CM | POA: Diagnosis not present

## 2023-06-21 DIAGNOSIS — D631 Anemia in chronic kidney disease: Secondary | ICD-10-CM | POA: Diagnosis not present

## 2023-06-21 DIAGNOSIS — I1310 Hypertensive heart and chronic kidney disease without heart failure, with stage 1 through stage 4 chronic kidney disease, or unspecified chronic kidney disease: Secondary | ICD-10-CM | POA: Diagnosis not present

## 2023-06-21 DIAGNOSIS — E875 Hyperkalemia: Secondary | ICD-10-CM | POA: Diagnosis not present

## 2023-06-21 DIAGNOSIS — N2581 Secondary hyperparathyroidism of renal origin: Secondary | ICD-10-CM | POA: Diagnosis not present

## 2023-06-25 DIAGNOSIS — N2581 Secondary hyperparathyroidism of renal origin: Secondary | ICD-10-CM | POA: Diagnosis not present

## 2023-06-25 DIAGNOSIS — E875 Hyperkalemia: Secondary | ICD-10-CM | POA: Diagnosis not present

## 2023-06-25 DIAGNOSIS — I1310 Hypertensive heart and chronic kidney disease without heart failure, with stage 1 through stage 4 chronic kidney disease, or unspecified chronic kidney disease: Secondary | ICD-10-CM | POA: Diagnosis not present

## 2023-06-25 DIAGNOSIS — D631 Anemia in chronic kidney disease: Secondary | ICD-10-CM | POA: Diagnosis not present

## 2023-06-28 DIAGNOSIS — N184 Chronic kidney disease, stage 4 (severe): Secondary | ICD-10-CM | POA: Diagnosis not present

## 2023-07-05 ENCOUNTER — Ambulatory Visit
Admission: EM | Admit: 2023-07-05 | Discharge: 2023-07-05 | Disposition: A | Discharge status: Home / Self Care | Payer: Medicare HMO | Attending: Emergency Medicine | Admitting: Emergency Medicine

## 2023-07-05 ENCOUNTER — Telehealth: Payer: Medicare HMO | Admitting: Physician Assistant

## 2023-07-05 DIAGNOSIS — Z1152 Encounter for screening for COVID-19: Secondary | ICD-10-CM | POA: Insufficient documentation

## 2023-07-05 DIAGNOSIS — B349 Viral infection, unspecified: Secondary | ICD-10-CM

## 2023-07-05 DIAGNOSIS — J069 Acute upper respiratory infection, unspecified: Secondary | ICD-10-CM

## 2023-07-05 NOTE — Discharge Instructions (Addendum)
Your COVID test is pending.    Take Tylenol as needed for fever or discomfort.  Rest and keep yourself hydrated.    Follow-up with your primary care provider if your symptoms are not improving.     

## 2023-07-05 NOTE — Progress Notes (Signed)
Patient going for in person evaluation.

## 2023-07-05 NOTE — ED Provider Notes (Signed)
Renaldo Fiddler    CSN: [id number] Arrival date & time: 07/05/23  2130      History   Chief Complaint Chief Complaint  Patient presents with   Headache    HPI Carmen Buchanan is a 67 y.o. female.  Patient presents with 1 day history of headache, sore throat, hoarse voice, postnasal drip, congestion, cough.  No OTC medications taken.  No fever, shortness of breath, or other symptoms.  The history is provided by the patient and medical records.    Past Medical History:  Diagnosis Date   Anemia    past hx since bariatric surgery 2012- Iron infusion last 09-2019   Anxiety    much improved after weight loss started in late 2012   Arthritis    CKD (chronic kidney disease) stage 3, GFR 30-59 ml/min (HCC)    Complication of anesthesia    after hemorrhoid surgery-inability to sleep for 3 weeks   GERD (gastroesophageal reflux disease)    Hemorrhoid    HTN (hypertension)    stopped meds after bariatric surgery   IBS (irritable bowel syndrome)    intermittent   Migraine, unspecified, without mention of intractable migraine without mention of status migrainosus    Obesity    Renal stones     Patient Active Problem List   Diagnosis Date Noted   Suspected UTI 05/16/2023   Knee pain 05/16/2023   Rash 04/07/2023   CKD (chronic kidney disease) 04/26/2022   Abdominal pain 03/08/2022   COVID 08/30/2021   Nausea 08/30/2021   Tachycardia 02/09/2021   History of anemia 02/09/2021   Advance care planning 11/12/2019   Routine general medical examination at a health care facility 04/30/2016   Renal stone 06/07/2013   Hand dermatitis 10/17/2012   Hemorrhoid 11/08/2011   S/P gastric bypass 07/28/2011   IRRITABLE BOWEL SYNDROME 12/10/2008   OBESITY 12/30/2007   Anxiety state 12/30/2007   MIGRAINE HEADACHE 12/30/2007    Past Surgical History:  Procedure Laterality Date   ABLATION     uterus   CESAREAN SECTION     CYSTOSCOPY N/A 07/14/2013   Procedure: CYSTOSCOPY;   Surgeon: Sebastian Ache, MD;  Location: [facility];  Service: Urology;  Laterality: N/A;   GASTRIC BYPASS     sleeve procedure 06/2011 [facility] Mini Bypass  2012   HEMORRHOID SURGERY  2013   LASER ABLATION CONDYLOMA CERVICAL / VULVAR  5/04   uncontrollable bleeding   LUMBAR DISC SURGERY  7/01   L4-5; Dr. Elesa Hacker   NEPHROLITHOTOMY Right 07/14/2013   Procedure: NEPHROLITHOTOMY PERCUTANEOUS  Procedure: Right 1st stage Percutaneous Nephrostolithotomy with surgeon access and cystoscopy;  Surgeon: Sebastian Ache, MD;  Location: [facility];  Service: Urology;  Laterality: Right;   NEPHROLITHOTOMY Right 07/16/2013   Procedure: NEPHROLITHOTOMY PERCUTANEOUS SECOND LOOK, diagnostic ureteroscopy and stent placement on the right ;  Surgeon: Sebastian Ache, MD;  Location: [facility];  Service: Urology;  Laterality: Right;   TONSILLECTOMY AND ADENOIDECTOMY  1970    OB History   No obstetric history on file.      Home Medications    Prior to Admission medications   Medication Sig Start Date End Date Taking? Authorizing Provider  buPROPion [facility] SR) 150 MG 12 hr tablet TAKE 1 TABLET BY MOUTH TWICE A DAY 04/30/23  Yes Joaquim Nam, MD  FLUoxetine (PROZAC) 40 MG capsule TAKE 2 CAPSULES BY MOUTH EVERY DAY 04/30/23  Yes Joaquim Nam, MD  LORazepam (ATIVAN) 0.5 MG  tablet Take 1 tablet (0.5 mg total) by mouth daily as needed. 08/07/22  Yes Joaquim Nam, MD  omeprazole (PRILOSEC) 20 MG capsule Take 1 capsule (20 mg total) by mouth in the morning and at bedtime. 07/27/22  Yes Joaquim Nam, MD  polyethylene glycol [facility] / Ethelene Hal) packet Take 17 g by mouth daily.   Yes [provider]  lisinopril (ZESTRIL) 5 MG tablet Take 5 mg by mouth daily.    [provider]    Family History Family History  Problem Relation Age of Onset   Coronary artery disease Father        MI; CABG   Colon polyps Father    Aortic aneurysm Mother        on beta blocker   Other Mother         Cardiovascualr disease   Ulcers Mother    Colon polyps Mother    Heart disease Mother    Colon polyps Other        Family history   Colon cancer Neg Hx    Breast cancer Neg Hx    Esophageal cancer Neg Hx    Rectal cancer Neg Hx    Stomach cancer Neg Hx     Social History Social History   Tobacco Use   Smoking status: Never   Smokeless tobacco: Never  Vaping Use   Vaping status: Never Used  Substance Use Topics   Alcohol use: Not Currently   Drug use: No     Allergies   Ciprofloxacin, Latex, Morphine, and Nsaids   Review of Systems Review of Systems  Constitutional:  Negative for chills and fever.  HENT:  Positive for congestion, postnasal drip, sore throat and voice change. Negative for ear pain.   Respiratory:  Positive for cough. Negative for shortness of breath.   Cardiovascular:  Negative for chest pain and palpitations.  Gastrointestinal:  Negative for diarrhea and vomiting.  Neurological:  Positive for headaches.     Physical Exam Triage Vital Signs ED Triage Vitals  Encounter Vitals Group     BP      Systolic BP Percentile      Diastolic BP Percentile      Pulse      Resp      Temp      Temp src      SpO2      Weight      Height      Head Circumference      Peak Flow      Pain Score      Pain Loc      Pain Education      Exclude from Growth Chart    No data found.  Updated Vital Signs BP (!) 144/76 (BP Location: Left Arm)   Pulse 72   Temp 98.1 F (36.7 C) (Oral)   Resp 18   SpO2 96%   Visual Acuity Right Eye Distance:   Left Eye Distance:   Bilateral Distance:    Right Eye Near:   Left Eye Near:    Bilateral Near:     Physical Exam Vitals and nursing note reviewed.  Constitutional:      General: She is not in acute distress.    Appearance: She is well-developed.  HENT:     Right Ear: Tympanic membrane normal.     Left Ear: Tympanic membrane normal.     Nose: Nose normal.     Mouth/Throat:     Mouth:  Mucous  membranes are moist.     Pharynx: Oropharynx is clear.     Comments: Clear PND. Cardiovascular:     Rate and Rhythm: Normal rate and regular rhythm.     Heart sounds: Normal heart sounds.  Pulmonary:     Effort: Pulmonary effort is normal. No respiratory distress.     Breath sounds: Normal breath sounds.  Musculoskeletal:     Cervical back: Neck supple.  Skin:    General: Skin is warm and dry.  Neurological:     Mental Status: She is alert.      UC Treatments / Results  Labs (all labs ordered are listed, but only abnormal results are displayed) Labs Reviewed  SARS CORONAVIRUS 2 (TAT 6-24 HRS)    EKG   Radiology No results found.  Procedures Procedures (including critical care time)  Medications Ordered in UC Medications - No data to display  Initial Impression / Assessment and Plan / UC Course  I have reviewed the triage vital signs and the nursing notes.  Pertinent labs & imaging results that were available during my care of the patient were reviewed by me and considered in my medical decision making (see chart for details).    Viral illness.  Afebrile and vital signs are stable.  Lungs are clear and O2 sat is 96% on room air.  COVID pending.  If COVID test is positive, recommend treatment with molnupiravir.  Discussed symptomatic treatment including Tylenol, rest, hydration.  Instructed patient to follow up with PCP if symptoms are not improving.  She agrees to plan of care.   Final Clinical Impressions(s) / UC Diagnoses   Final diagnoses:  Viral illness     Discharge Instructions      Your COVID test is pending.    Take Tylenol as needed for fever or discomfort.  Rest and keep yourself hydrated.    Follow-up with your primary care provider if your symptoms are not improving.         ED Prescriptions   None    PDMP not reviewed this encounter.   Mickie Bail, NP 07/05/23 [phone]

## 2023-07-05 NOTE — Progress Notes (Signed)
Message sent to patient requesting further input regarding current symptoms. Awaiting patient response.  

## 2023-07-05 NOTE — ED Triage Notes (Signed)
Scratchy throat, headache, congestion, cough that started yesterday. Not taking any OTC medication at this time.

## 2023-07-06 LAB — SARS CORONAVIRUS 2 (TAT 6-24 HRS): SARS Coronavirus 2: NEGATIVE

## 2023-07-12 ENCOUNTER — Ambulatory Visit: Payer: Medicare HMO | Admitting: Family Medicine

## 2023-07-12 VITALS — BP 138/78 | HR 66 | Temp 98.3°F | Ht 69.0 in | Wt 231.4 lb

## 2023-07-12 DIAGNOSIS — J019 Acute sinusitis, unspecified: Secondary | ICD-10-CM | POA: Insufficient documentation

## 2023-07-12 MED ORDER — FLUCONAZOLE 150 MG PO TABS: 150.0000 mg | ORAL_TABLET | ORAL | 0 refills | Status: DC

## 2023-07-12 MED ORDER — DOXYCYCLINE HYCLATE 100 MG PO TABS: 100.0000 mg | ORAL_TABLET | Freq: Two times a day (BID) | ORAL | 0 refills | Status: DC

## 2023-07-12 NOTE — Assessment & Plan Note (Signed)
Okay for outpatient follow-up.  Doxycycline, with sunburn caution.  Use Diflucan given history of yeast infections.  Rest and fluids.  Update [place] as needed.  She agrees to plan.

## 2023-07-12 NOTE — Progress Notes (Signed)
HA and ST.  Seen in UC 1 week ago.  Neg covid test 1 week ago.  Still with HA around the eyes B.  Still with ST.  Nasal congestion.  Post nasal gtt.  No fevers.  Some cough.  No sputum. B ear pain.  She doesn't feel better than last week, possibly slightly worse.  She is fatigued and also tired of being sick.    Meds, vitals, and allergies reviewed.   ROS: Per HPI unless specifically indicated in ROS section   GEN: nad, alert and oriented HEENT: mucous membranes moist, tm w/o erythema, Maxillary greater than frontal sinus tenderness to palpation bilaterally.  Nasal exam stuffy.  OP with cobblestoning NECK: supple w/o LA CV: rrr.   PULM: ctab, no inc wob EXT: no edema SKIN: Well-perfused.

## 2023-07-12 NOTE — Patient Instructions (Signed)
Start doxy and use diflucan if needed.  Rest and fluids.  Update [place] as needed.  Take care.  Glad to see you.

## 2023-07-19 DIAGNOSIS — N184 Chronic kidney disease, stage 4 (severe): Secondary | ICD-10-CM | POA: Diagnosis not present

## 2023-08-16 DIAGNOSIS — N200 Calculus of kidney: Secondary | ICD-10-CM | POA: Diagnosis not present

## 2023-08-16 DIAGNOSIS — I1310 Hypertensive heart and chronic kidney disease without heart failure, with stage 1 through stage 4 chronic kidney disease, or unspecified chronic kidney disease: Secondary | ICD-10-CM | POA: Diagnosis not present

## 2023-08-16 DIAGNOSIS — Z9884 Bariatric surgery status: Secondary | ICD-10-CM | POA: Diagnosis not present

## 2023-08-16 DIAGNOSIS — I10 Essential (primary) hypertension: Secondary | ICD-10-CM | POA: Diagnosis not present

## 2023-08-16 DIAGNOSIS — E2089 Other specified hypoparathyroidism: Secondary | ICD-10-CM | POA: Diagnosis not present

## 2023-08-27 DIAGNOSIS — N184 Chronic kidney disease, stage 4 (severe): Secondary | ICD-10-CM | POA: Diagnosis not present

## 2023-08-27 DIAGNOSIS — N2581 Secondary hyperparathyroidism of renal origin: Secondary | ICD-10-CM | POA: Diagnosis not present

## 2023-08-27 DIAGNOSIS — I10 Essential (primary) hypertension: Secondary | ICD-10-CM | POA: Diagnosis not present

## 2023-08-27 DIAGNOSIS — D631 Anemia in chronic kidney disease: Secondary | ICD-10-CM | POA: Diagnosis not present

## 2023-09-03 ENCOUNTER — Other Ambulatory Visit: Payer: Self-pay | Admitting: Family Medicine

## 2023-09-17 ENCOUNTER — Ambulatory Visit: Payer: Self-pay | Admitting: Family Medicine

## 2023-09-17 NOTE — Telephone Encounter (Signed)
Copied from CRM [id number]. Topic: Clinical - Red Word Triage >> Sep 17, 2023  9:46 AM Carmen Buchanan wrote: Red Word that prompted transfer to Nurse Triage: Sinus infection (Earache, headache, low grade fever)  Chief Complaint: possible sinus infection Symptoms: head & nasal congestion, headache, ear & facial pain, fever Frequency: started last night Pertinent Negatives: Patient denies SOB and chest pain Disposition: [] ED /[] Urgent Care (no appt availability in office) / [x] Appointment(In office/virtual)/ []  [place] [facility]/ [] Home Care/ [] Refused Recommended Disposition /[] [place] [facility]/ []  Follow-up with PCP Additional Notes: pt stated sinus infection in the past and feels like one now. S/s started last night: took muccinex and tylenol without relief: s/s getting worse. PCP appt scheduled for 09/18/2023: nurse gave some home advice.  Reason for Disposition  [1] MILD pain (e.g., does not interfere with normal activities) AND [2] constant AND [3] present > 24 hours  (Exception: Symptom is chronic.)  Answer Assessment - Initial Assessment Questions 1. ONSET: "When did the pain start?" (e.g., minutes, hours, days)     Last night 2. ONSET: "Does the pain come and go, or has it been constant since it started?" (e.g., constant, intermittent, fleeting)     constant 3. SEVERITY: "How bad is the pain?"   (Scale 1-10; mild, moderate or severe)   - MILD (1-3): doesn't interfere with normal activities    - MODERATE (4-7): interferes with normal activities or awakens from sleep    - SEVERE (8-10): excruciating pain, unable to do any normal activities      8/10 4. LOCATION: "Where does it hurt?"      Face, ear,head  5. RASH: "Is there any redness, rash, or swelling of the face?"     no 6. FEVER: "Do you have a fever?" If Yes, ask: "What is it, how was it measured, and when did it start?"      Yes feels like a fever 7. OTHER SYMPTOMS: "Do you have any other symptoms?" (e.g., fever,  toothache, nasal discharge, nasal congestion, clicking sensation in jaw joint)     Head and nasal congestion, headache. Sinus pressure 8. PREGNANCY: "Is there any chance you are pregnant?" "When was your last menstrual period?"     N/a  Protocols used: Face Pain-A-AH

## 2023-09-17 NOTE — Telephone Encounter (Signed)
FYI: Patient is scheduled to be seen 09/18/23

## 2023-09-18 ENCOUNTER — Ambulatory Visit: Payer: Medicare HMO | Admitting: Family Medicine

## 2023-09-18 ENCOUNTER — Encounter: Payer: Self-pay | Admitting: Family Medicine

## 2023-09-18 VITALS — BP 138/74 | HR 70 | Temp 98.7°F | Ht 69.0 in | Wt 240.0 lb

## 2023-09-18 DIAGNOSIS — J019 Acute sinusitis, unspecified: Secondary | ICD-10-CM | POA: Diagnosis not present

## 2023-09-18 DIAGNOSIS — J069 Acute upper respiratory infection, unspecified: Secondary | ICD-10-CM

## 2023-09-18 LAB — POC COVID19 BINAXNOW: SARS Coronavirus 2 Ag: NEGATIVE

## 2023-09-18 LAB — POCT INFLUENZA A/B
Influenza A, POC: NEGATIVE
Influenza B, POC: NEGATIVE

## 2023-09-18 MED ORDER — DOXYCYCLINE HYCLATE 100 MG PO TABS: 100.0000 mg | ORAL_TABLET | Freq: Two times a day (BID) | ORAL | 0 refills | Status: DC

## 2023-09-18 MED ORDER — FLUCONAZOLE 150 MG PO TABS: 150.0000 mg | ORAL_TABLET | ORAL | 0 refills | Status: DC

## 2023-09-18 NOTE — Progress Notes (Signed)
3 days of sx.  Started with head congestion, ears aching, ST.  Then progressive sx with rhinorrhea.  Sneezing.  No fevers known, felt like she could have possibly had a fever.  Fatigued.  Diffuse headache, facial pain.  Ear pain.  Not SOB.  No sputum.  Minimal cough.    Grandson with RSV recently.  He is feeling better now.   Meds, vitals, and allergies reviewed.   ROS: Per HPI unless specifically indicated in ROS section   TM wnl B Nasal exam mildly stuffy OP mild irritation.  No exudates.  Neck supple no LA Rrr Ctab Skin well perfused.   Max sinuses ttp B

## 2023-09-18 NOTE — Patient Instructions (Signed)
Negative flu and covid.  RSV pending.  Start doxy.  Use diflucan if needed.  Rest and fluids.  Take care.  Glad to see you.

## 2023-09-21 ENCOUNTER — Encounter: Payer: Self-pay | Admitting: Family Medicine

## 2023-09-21 NOTE — Telephone Encounter (Signed)
Do you mind taking a look at this. Dr. Para March will not be back until next week.

## 2023-09-21 NOTE — Assessment & Plan Note (Signed)
Discussed options. Negative flu and covid.  RSV pending.  Start doxy.  Use diflucan if needed.  Rest and fluids.  Okay for outpatient follow-up.  She agrees to plan.

## 2023-09-22 LAB — RESPIRATORY VIRUS PANEL
Adenovirus B: NOT DETECTED
HUMAN PARAINFLU VIRUS 1: NOT DETECTED
HUMAN PARAINFLU VIRUS 2: NOT DETECTED
HUMAN PARAINFLU VIRUS 3: NOT DETECTED
INFLUENZA A SUBTYPE H1: NOT DETECTED
INFLUENZA A SUBTYPE H3: NOT DETECTED
Influenza A: NOT DETECTED
Influenza B: NOT DETECTED
Metapneumovirus: NOT DETECTED
Respiratory Syncytial Virus A: NOT DETECTED
Respiratory Syncytial Virus B: NOT DETECTED
Rhinovirus: DETECTED — AB

## 2023-09-28 ENCOUNTER — Telehealth: Payer: Self-pay | Admitting: Family Medicine

## 2023-09-28 NOTE — Telephone Encounter (Signed)
 Copied from CRM [id number]. Topic: General - Call Back - No Documentation >> Sep 28, 2023 10:55 AM Alvino Blood C wrote: Reason for CRM: Patient is returning a missed call and would like a call back at [phone]

## 2023-09-28 NOTE — Telephone Encounter (Signed)
 Called patient and left voicemail that patient can disregard the previous call. It appears that Dr. Reece Agar notified her of her results

## 2023-10-03 ENCOUNTER — Ambulatory Visit: Payer: Self-pay | Admitting: Family Medicine

## 2023-10-03 NOTE — Telephone Encounter (Signed)
 Copied from CRM [id number]. Topic: Clinical - Red Word Triage >> Oct 03, 2023  8:07 AM Robinson DEL wrote: [facility] that prompted transfer to Nurse Triage: Sinus infection, yesterday came back sore throat, stuffy head, and cough. Headache about an 8  Chief Complaint: sinus pain Symptoms: cough, headache, runny and stuffy nose, sore throat and ear aches Frequency: constant; started 2 weeks ago Pertinent Negatives: Patient denies fever Disposition: [] ED /[] Urgent Care (no appt availability in office) / [x] Appointment(In office/virtual)/ []  [place] [facility]/ [] Home Care/ [] Refused Recommended Disposition /[] [place] [facility]/ []  Follow-up with PCP Additional Notes: patient states was seen in office two weeks ago with same issue.  States was diagnosed with a sinus infection.  States thought she was getting better and then symptoms became worse.  Apt made for tomorrow with PCP.  Office updated.  Care advice given; patient denies questions.  Instructed to go to er if becomes worse.   Reason for Disposition  Fever present > 3 days (72 hours)  Answer Assessment - Initial Assessment Questions 1. LOCATION: Where does it hurt?      Headache, ears, throat 2. ONSET: When did the sinus pain start?  (e.g., hours, days)      2 weeks ago 3. SEVERITY: How bad is the pain?   (Scale 1-10; mild, moderate or severe)   - MILD (1-3): doesn't interfere with normal activities    - MODERATE (4-7): interferes with normal activities (e.g., work or school) or awakens from sleep   - SEVERE (8-10): excruciating pain and patient unable to do any normal activities        7-8/10 4. RECURRENT SYMPTOM: Have you ever had sinus problems before? If Yes, ask: When was the last time? and What happened that time?      Yes two weeks ago 5. NASAL CONGESTION: Is the nose blocked? If Yes, ask: Can you open it or must you breathe through your mouth?     Both runny and congestion 6. NASAL DISCHARGE: Do you have  discharge from your nose? If so ask, What color?     clear 7. FEVER: Do you have a fever? If Yes, ask: What is it, how was it measured, and when did it start?      denies 8. OTHER SYMPTOMS: Do you have any other symptoms? (e.g., sore throat, cough, earache, difficulty breathing)     Sore throat, cough, earache, difficulty breathing at times, headache.  Protocols used: Sinus Pain or Congestion-A-AH

## 2023-10-03 NOTE — Telephone Encounter (Signed)
 Noted. Thanks.

## 2023-10-04 ENCOUNTER — Ambulatory Visit (INDEPENDENT_AMBULATORY_CARE_PROVIDER_SITE_OTHER): Payer: Medicare HMO | Admitting: Family Medicine

## 2023-10-04 ENCOUNTER — Encounter: Payer: Self-pay | Admitting: Family Medicine

## 2023-10-04 VITALS — BP 144/70 | HR 83 | Temp 99.6°F | Ht 69.0 in | Wt 234.4 lb

## 2023-10-04 DIAGNOSIS — J019 Acute sinusitis, unspecified: Secondary | ICD-10-CM | POA: Diagnosis not present

## 2023-10-04 MED ORDER — AMOXICILLIN-POT CLAVULANATE 500-125 MG PO TABS: 1.0000 | ORAL_TABLET | Freq: Two times a day (BID) | ORAL | 0 refills | Status: DC

## 2023-10-04 NOTE — Patient Instructions (Signed)
 Rest, fluids, augmentin.  Update [place] as needed.  Take care.  Glad to see you.

## 2023-10-04 NOTE — Progress Notes (Signed)
 She prev took doxycycline  rx and improved some/transiently in the meantime.  Sx started worsening over the last week.  Some nausea and generally felt weak.  Some cough over the last few days.  Sleep disrupted from nasal congestion.  Can still get a deep breath. Minimal sputum.  No fevers.  Facial pain.  HA.  Frontal and periorbital pain.    She never got to the point of being symptom free.    Meds, vitals, and allergies reviewed.   ROS: Per HPI unless specifically indicated in ROS section   GEN: nad, alert and oriented HEENT: mucous membranes moist, tm w/o erythema, nasal exam w/o erythema, clear discharge noted NECK: supple w/o LA CV: rrr.   PULM: ctab, no inc wob EXT: no edema SKIN: no acute rash Max>frontal sinuses ttp

## 2023-10-07 NOTE — Assessment & Plan Note (Signed)
 Rest, fluids, renal dose augmentin.  Update [place] as needed. Okay for outpatient f/u.  She agrees with plan.

## 2023-10-11 ENCOUNTER — Ambulatory Visit (INDEPENDENT_AMBULATORY_CARE_PROVIDER_SITE_OTHER): Payer: Medicare HMO | Admitting: Family Medicine

## 2023-10-11 ENCOUNTER — Encounter: Payer: Self-pay | Admitting: Family Medicine

## 2023-10-11 VITALS — BP 128/68 | HR 74 | Temp 98.4°F | Ht 69.0 in | Wt 235.8 lb

## 2023-10-11 DIAGNOSIS — R8290 Unspecified abnormal findings in urine: Secondary | ICD-10-CM | POA: Diagnosis not present

## 2023-10-11 DIAGNOSIS — N200 Calculus of kidney: Secondary | ICD-10-CM | POA: Diagnosis not present

## 2023-10-11 DIAGNOSIS — I10 Essential (primary) hypertension: Secondary | ICD-10-CM | POA: Diagnosis not present

## 2023-10-11 DIAGNOSIS — J019 Acute sinusitis, unspecified: Secondary | ICD-10-CM | POA: Diagnosis not present

## 2023-10-11 DIAGNOSIS — I1310 Hypertensive heart and chronic kidney disease without heart failure, with stage 1 through stage 4 chronic kidney disease, or unspecified chronic kidney disease: Secondary | ICD-10-CM | POA: Diagnosis not present

## 2023-10-11 DIAGNOSIS — N184 Chronic kidney disease, stage 4 (severe): Secondary | ICD-10-CM | POA: Diagnosis not present

## 2023-10-11 DIAGNOSIS — E875 Hyperkalemia: Secondary | ICD-10-CM | POA: Diagnosis not present

## 2023-10-11 DIAGNOSIS — N2581 Secondary hyperparathyroidism of renal origin: Secondary | ICD-10-CM | POA: Diagnosis not present

## 2023-10-11 DIAGNOSIS — N179 Acute kidney failure, unspecified: Secondary | ICD-10-CM | POA: Diagnosis not present

## 2023-10-11 MED ORDER — AMOXICILLIN-POT CLAVULANATE 500-125 MG PO TABS: 1.0000 | ORAL_TABLET | Freq: Two times a day (BID) | ORAL | 0 refills | Status: DC

## 2023-10-11 MED ORDER — PREDNISONE 10 MG PO TABS: ORAL_TABLET | ORAL | 0 refills | Status: DC

## 2023-10-11 NOTE — Patient Instructions (Signed)
Start prednisone with food and continue augmentin.  Please update me in a few days if you are not clearly getting better.  Take care.  Glad to see you.

## 2023-10-11 NOTE — Progress Notes (Signed)
D/w pt about prev events:  She prev took doxycycline rx and improved some/transiently in the meantime.  Sx started worsening over the last week.  Some nausea and generally felt weak.  Some cough over the last few days.  Sleep disrupted from nasal congestion.  Can still get a deep breath. Minimal sputum.  No fevers.  Facial pain.  HA.  Frontal and periorbital pain.     She never got to the point of being symptom free.    Started on augmentin on 10/04/23.  No fevers now.  Still coughing, getting worse.  Facial pain, variable severity.  Ear pain, HA.  Some cough, some sputum.  No vomiting, some nausea likely from post nasal gtt.    After a few days she was getting minimally better on augmentin but then regressed in the meantime.   Meds, vitals, and allergies reviewed.   ROS: Per HPI unless specifically indicated in ROS section   Nad Ncat Neck supple, no LA MMM Irritation medial R nostril noted w/o bleeding at OV Rrr Ctab L chest wall sore with cough.   Max sinuses ttp x4.   Skin well perfused.

## 2023-10-14 NOTE — Assessment & Plan Note (Signed)
Discussed options. Okay for outpatient f/u.  Defer CXR given ongoing abx and lungs ctab.  Start prednisone with food and continue augmentin.  She can update me in a few days if not clearly getting better.

## 2023-10-15 ENCOUNTER — Ambulatory Visit: Payer: Self-pay | Admitting: Family Medicine

## 2023-10-15 DIAGNOSIS — R059 Cough, unspecified: Secondary | ICD-10-CM

## 2023-10-15 NOTE — Telephone Encounter (Unsigned)
  Chief Complaint: *** Symptoms: *** Frequency: *** Pertinent Negatives: Patient denies *** Disposition: [] ED /[] Urgent Care (no appt availability in office) / [] Appointment(In office/virtual)/ []  [place] [facility]/ [] Home Care/ [] Refused Recommended Disposition /[] [place] [facility]/ []  Follow-up with PCP Additional Notes: ***

## 2023-10-15 NOTE — Telephone Encounter (Signed)
  Chief Complaint: just letting Dr. Para March know that I am not better, nor worse.  Symptoms: cough, sinus pressure, minimal nasal drainage Frequency: 09/17/23 Pertinent Negatives: Patient denies fever, SOB,  Disposition: [] ED /[] Urgent Care (no appt availability in office) / [] Appointment(In office/virtual)/ []  [place] [facility]/ [] Home Care/ [] Refused Recommended Disposition /[] [place] [facility]/ [x]  Follow-up with PCP Additional Notes: Pt states that she is letting her PCP know that she is still having the same s/s, not better, nor worse. Pt states that DR Para March requested that she called and updated him. Pt is inquiring if she should still take the medications-steroids and abx that he prescribed as she still has about a week left. Confirms she has been taking them as prescribed up to this point. Pt will be awaiting further instruction from PCP, was advised to continue to take the medications as prescribed until she hears from PCP.    Reason for Disposition  [1] Nasal discharge AND [2] present > 10 days  Answer Assessment - Initial Assessment Questions 1. LOCATION: "Where does it hurt?"      Face hurts 2. ONSET: "When did the sinus pain start?"  (e.g., hours, days)      Ongoing since christmas eve 3. SEVERITY: "How bad is the pain?"   (Scale 1-10; mild, moderate or severe)   - MILD (1-3): doesn't interfere with normal activities    - MODERATE (4-7): interferes with normal activities (e.g., work or school) or awakens from sleep   - SEVERE (8-10): excruciating pain and patient unable to do any normal activities        5 5. NASAL CONGESTION: "Is the nose blocked?" If Yes, ask: "Can you open it or must you breathe through your mouth?"     Nose not stuffy anymore 6. NASAL DISCHARGE: "Do you have discharge from your nose?" If so ask, "What color?"     A little bit 7. FEVER: "Do you have a fever?" If Yes, ask: "What is it, how was it measured, and when did it start?"      denies 8.  OTHER SYMPTOMS: "Do you have any other symptoms?" (e.g., sore throat, cough, earache, difficulty breathing)     Still coughing that has not changed  Protocols used: Sinus Pain or Congestion-A-AH

## 2023-10-15 NOTE — Telephone Encounter (Signed)
Copied from CRM [id number]. Topic: Clinical - Pink Word Triage >> Oct 15, 2023 11:10 AM Efraim Kaufmann C wrote: Reason for Triage: patient saw doctor for sinus infection on Friday, he gave her prednisone and told her to contact us if she doesn't feel better and patient isn't feeling any better.  Triage Nurse 1st attempt to call pt to further assess, no answer, LVM for call back. Placed in call back.

## 2023-10-16 ENCOUNTER — Encounter: Payer: Self-pay | Admitting: Family Medicine

## 2023-10-16 ENCOUNTER — Telehealth: Payer: Self-pay

## 2023-10-16 ENCOUNTER — Ambulatory Visit (INDEPENDENT_AMBULATORY_CARE_PROVIDER_SITE_OTHER)
Admission: RE | Admit: 2023-10-16 | Discharge: 2023-10-16 | Disposition: A | Discharge status: Home / Self Care | Payer: Medicare HMO | Source: Ambulatory Visit | Attending: Family Medicine | Admitting: Family Medicine

## 2023-10-16 ENCOUNTER — Other Ambulatory Visit (INDEPENDENT_AMBULATORY_CARE_PROVIDER_SITE_OTHER): Payer: Medicare HMO

## 2023-10-16 ENCOUNTER — Telehealth: Payer: Self-pay | Admitting: Family Medicine

## 2023-10-16 DIAGNOSIS — R059 Cough, unspecified: Secondary | ICD-10-CM

## 2023-10-16 NOTE — Telephone Encounter (Signed)
Copied from CRM [id number]. Topic: General - Other >> Oct 16, 2023 10:53 AM Melissa C wrote: Reason for CRM: patient was also recommend for STAT xray per Mychart messages that she has been having with doctor and she wanted to schedule immediately while scheduling her labs as she was at work. She was hoping that she would get them both done today since it seemed the doctor was concerned about getting the xray. I saw on the KMS that you do offer imaging services, however I did not see where to schedule xray services. Please advise with patient if needed.  I apologize if this has caused inconvenience. Thank you

## 2023-10-16 NOTE — Telephone Encounter (Signed)
Added from other crm   Copied from CRM [id number]. Topic: Clinical - Medical Advice >> Oct 16, 2023  9:09 AM Louie Boston wrote: Reason for CRM: Patient returned phone call from [facility]. Please contact patient at [phone]. Patient is working and would prefer to be communicated with via MyChart. Pease reach out to patient via MyChart if unable to reach her by phone.

## 2023-10-16 NOTE — Telephone Encounter (Signed)
Per crm I have sent information from Dr. Para March to patient via my chart with instructions to call office if any questions.

## 2023-10-16 NOTE — Telephone Encounter (Signed)
Copied from CRM [id number]. Topic: Clinical - Medical Advice >> Oct 16, 2023  9:09 AM Louie Boston wrote: Reason for CRM: Patient returned phone call from [facility]. Please contact patient at [phone]. Patient is working and would prefer to be communicated with via MyChart. Pease reach out to patient via MyChart if unable to reach her by phone.

## 2023-10-16 NOTE — Telephone Encounter (Signed)
Added to open phone note for further documentation please check in that encounter

## 2023-10-16 NOTE — Telephone Encounter (Signed)
Please call and see if she can come in for labs and stat CXR.  I would continue the meds for now. Thanks.

## 2023-10-16 NOTE — Telephone Encounter (Signed)
Called patient and left a voice message asking to give me a call back at the office.

## 2023-10-17 ENCOUNTER — Other Ambulatory Visit: Payer: Self-pay | Admitting: Family Medicine

## 2023-10-17 DIAGNOSIS — J019 Acute sinusitis, unspecified: Secondary | ICD-10-CM

## 2023-10-17 DIAGNOSIS — N189 Chronic kidney disease, unspecified: Secondary | ICD-10-CM

## 2023-10-17 LAB — CBC WITH DIFFERENTIAL/PLATELET
Basophils Absolute: 0 10*3/uL (ref 0.0–0.1)
Basophils Relative: 0.3 % (ref 0.0–3.0)
Eosinophils Absolute: 0 10*3/uL (ref 0.0–0.7)
Eosinophils Relative: 0.2 % (ref 0.0–5.0)
HCT: 36 % (ref 36.0–46.0)
Hemoglobin: 11.5 g/dL — ABNORMAL LOW (ref 12.0–15.0)
Lymphocytes Relative: 8.6 % — ABNORMAL LOW (ref 12.0–46.0)
Lymphs Abs: 1.1 10*3/uL (ref 0.7–4.0)
MCHC: 32 g/dL (ref 30.0–36.0)
MCV: 87.8 fL (ref 78.0–100.0)
Monocytes Absolute: 0.4 10*3/uL (ref 0.1–1.0)
Monocytes Relative: 3.1 % (ref 3.0–12.0)
Neutro Abs: 11.3 10*3/uL — ABNORMAL HIGH (ref 1.4–7.7)
Neutrophils Relative %: 87.8 % — ABNORMAL HIGH (ref 43.0–77.0)
Platelets: 409 10*3/uL — ABNORMAL HIGH (ref 150.0–400.0)
RBC: 4.1 Mil/uL (ref 3.87–5.11)
RDW: 14.1 % (ref 11.5–15.5)
WBC: 12.8 10*3/uL — ABNORMAL HIGH (ref 4.0–10.5)

## 2023-10-17 LAB — BASIC METABOLIC PANEL
BUN: 36 mg/dL — ABNORMAL HIGH (ref 6–23)
CO2: 24 meq/L (ref 19–32)
Calcium: 9.1 mg/dL (ref 8.4–10.5)
Chloride: 103 meq/L (ref 96–112)
Creatinine, Ser: 2.64 mg/dL — ABNORMAL HIGH (ref 0.40–1.20)
GFR: 18.33 mL/min — ABNORMAL LOW (ref >60.00)
Glucose, Bld: 181 mg/dL — ABNORMAL HIGH (ref 70–99)
Potassium: 5.4 meq/L — ABNORMAL HIGH (ref 3.5–5.1)
Sodium: 137 meq/L (ref 135–145)

## 2023-10-18 ENCOUNTER — Other Ambulatory Visit: Payer: Self-pay | Admitting: Family Medicine

## 2023-10-18 ENCOUNTER — Telehealth: Payer: Self-pay | Admitting: Family Medicine

## 2023-10-18 ENCOUNTER — Encounter: Payer: Self-pay | Admitting: Family Medicine

## 2023-10-18 MED ORDER — CLINDAMYCIN HCL 300 MG PO CAPS
300.0000 mg | ORAL_CAPSULE | Freq: Three times a day (TID) | ORAL | 0 refills | Status: DC
Start: 2023-10-18 — End: 2024-01-07

## 2023-10-18 NOTE — Telephone Encounter (Signed)
Closing encounter. Returned call to patient

## 2023-10-18 NOTE — Telephone Encounter (Signed)
Copied from CRM [id number]. Topic: General - Other >> Oct 18, 2023  1:32 PM Turkey A wrote: Reason for CRM: Patient called due to call from office-Agent called [employer] and was told the person that called is currently out to lunch. Patient loss service while on phone with patient but called her back and left a message detailing that person would call back after lunch

## 2023-10-22 ENCOUNTER — Other Ambulatory Visit: Payer: Medicare HMO

## 2023-10-26 ENCOUNTER — Encounter: Payer: Self-pay | Admitting: Family Medicine

## 2023-10-30 ENCOUNTER — Encounter: Payer: Self-pay | Admitting: Family Medicine

## 2023-11-08 DIAGNOSIS — M8588 Other specified disorders of bone density and structure, other site: Secondary | ICD-10-CM | POA: Diagnosis not present

## 2023-11-08 DIAGNOSIS — Z1231 Encounter for screening mammogram for malignant neoplasm of breast: Secondary | ICD-10-CM | POA: Diagnosis not present

## 2023-11-08 DIAGNOSIS — N958 Other specified menopausal and perimenopausal disorders: Secondary | ICD-10-CM | POA: Diagnosis not present

## 2023-11-08 DIAGNOSIS — Z6837 Body mass index (BMI) 37.0-37.9, adult: Secondary | ICD-10-CM | POA: Diagnosis not present

## 2023-11-08 DIAGNOSIS — B3731 Acute candidiasis of vulva and vagina: Secondary | ICD-10-CM | POA: Diagnosis not present

## 2023-11-08 DIAGNOSIS — Z01419 Encounter for gynecological examination (general) (routine) without abnormal findings: Secondary | ICD-10-CM | POA: Diagnosis not present

## 2023-11-11 ENCOUNTER — Other Ambulatory Visit: Payer: Self-pay | Admitting: Family Medicine

## 2023-11-12 NOTE — Telephone Encounter (Signed)
 LAST REFILL:  FLUOXETINE HCL 40 MG CAPSULE 04/30/23 BUPROPION HCL SR 150 MG TABLET 04/30/23 LAST OFFICE VISIT: 08/17/2023  NEXT OFFICE VISIT: NOTHING SCHEDULED

## 2023-11-19 DIAGNOSIS — D631 Anemia in chronic kidney disease: Secondary | ICD-10-CM | POA: Diagnosis not present

## 2023-11-19 DIAGNOSIS — N2581 Secondary hyperparathyroidism of renal origin: Secondary | ICD-10-CM | POA: Diagnosis not present

## 2023-11-19 DIAGNOSIS — N184 Chronic kidney disease, stage 4 (severe): Secondary | ICD-10-CM | POA: Diagnosis not present

## 2023-11-19 DIAGNOSIS — R8290 Unspecified abnormal findings in urine: Secondary | ICD-10-CM | POA: Diagnosis not present

## 2023-11-19 DIAGNOSIS — I10 Essential (primary) hypertension: Secondary | ICD-10-CM | POA: Diagnosis not present

## 2023-11-21 ENCOUNTER — Other Ambulatory Visit: Payer: Self-pay

## 2023-11-26 DIAGNOSIS — E790 Hyperuricemia without signs of inflammatory arthritis and tophaceous disease: Secondary | ICD-10-CM | POA: Diagnosis not present

## 2023-11-26 DIAGNOSIS — N184 Chronic kidney disease, stage 4 (severe): Secondary | ICD-10-CM | POA: Diagnosis not present

## 2023-11-26 DIAGNOSIS — D631 Anemia in chronic kidney disease: Secondary | ICD-10-CM | POA: Diagnosis not present

## 2023-11-26 DIAGNOSIS — N2581 Secondary hyperparathyroidism of renal origin: Secondary | ICD-10-CM | POA: Diagnosis not present

## 2023-11-26 DIAGNOSIS — E875 Hyperkalemia: Secondary | ICD-10-CM | POA: Diagnosis not present

## 2023-12-27 DIAGNOSIS — H40013 Open angle with borderline findings, low risk, bilateral: Secondary | ICD-10-CM | POA: Diagnosis not present

## 2023-12-30 ENCOUNTER — Encounter: Payer: Self-pay | Admitting: Family Medicine

## 2023-12-31 ENCOUNTER — Telehealth: Payer: Self-pay | Admitting: Family Medicine

## 2023-12-31 ENCOUNTER — Ambulatory Visit: Payer: Self-pay | Admitting: Family Medicine

## 2023-12-31 NOTE — Telephone Encounter (Signed)
 See Virgilio Frees chart pts husband received call from [facility]; note from CRM. Left v/m requesting pts husband or pt to cb for triage.

## 2023-12-31 NOTE — Telephone Encounter (Signed)
 Please see separate phone note that E2C2 triaged pt but no appt was made. I spoke with pt and she said she is not having any abd pain today and no other symptoms(fever,N&V&D) today. Pt is concerned the pain might return and pt scheduled first appt pt could schedule with her schedule with Dr Para March 01/07/24 at 10:30. UC & ED precautions also given and pt voiced understanding and appreciated call. Sending note to Dr Para March.

## 2023-12-31 NOTE — Telephone Encounter (Signed)
 Please call pt about her pain, please see mychart message.  She likely needs eval- see about getting her scheduled here soon.  Thanks.

## 2023-12-31 NOTE — Telephone Encounter (Signed)
 Copied from CRM [id number]. Topic: Clinical - Red Word Triage >> Dec 31, 2023  3:56 PM Florestine Avers wrote: Red Word that prompted transfer to Nurse Triage: Patient had a missed called from nurse triage in regards to being triaged for her abdominal pain.   Chief Complaint: episode of severe abdominal Symptoms: sharp, pain lasting 1 night only  Frequency: none Pertinent Negatives: Patient denies pain at this time Disposition: [] ED /[] Urgent Care (no appt availability in office) / [] Appointment(In office/virtual)/ []  [facility]/ [x] Home Care/ [] Refused Recommended Disposition /[] [facility]/ [x]  Follow-up with PCP Additional Notes: Patient reports 1 episode of abd pain 3-4 days ago, no longer having pain. Wanted to let PCP know.   Reason for Disposition  [1] MILD-MODERATE pain AND [2] comes and goes (cramps)  Answer Assessment - Initial Assessment Questions 1. LOCATION: "Where does it hurt?"      No pain right now  2. RADIATION: "Does the pain shoot anywhere else?" (e.g., chest, back)     No  3. ONSET: "When did the pain begin?" (e.g., minutes, hours or days ago)      Happened 4 days ago, but was sudden and short  4. SUDDEN: "Gradual or sudden onset?"     Sudden  5. PATTERN "Does the pain come and go, or is it constant?"    - If it comes and goes: "How long does it last?" "Do you have pain now?"     (Note: Comes and goes means the pain is intermittent. It goes away completely between bouts.)    - If constant: "Is it getting better, staying the same, or getting worse?"      (Note: Constant means the pain never goes away completely; most serious pain is constant and gets worse.)      Comes and goes, no longer in pain  6. SEVERITY: "How bad is the pain?"  (e.g., Scale 1-10; mild, moderate, or severe)    - MILD (1-3): Doesn't interfere with normal activities, abdomen soft and not tender to touch.     - MODERATE (4-7): Interferes with normal activities or awakens from  sleep, abdomen tender to touch.     - SEVERE (8-10): Excruciating pain, doubled over, unable to do any normal activities.       No pain currently  7. RECURRENT SYMPTOM: "Have you ever had this type of stomach pain before?" If Yes, ask: "When was the last time?" and "What happened that time?"      No  8. CAUSE: "What do you think is causing the stomach pain?"     Unsure  9. RELIEVING/AGGRAVATING FACTORS: "What makes it better or worse?" (e.g., antacids, bending or twisting motion, bowel movement)     Better with antacids  10. OTHER SYMPTOMS: "Do you have any other symptoms?" (e.g., back pain, diarrhea, fever, urination pain, vomiting)       No  11. PREGNANCY: "Is there any chance you are pregnant?" "When was your last menstrual period?"       No  Protocols used: Abdominal Pain - [facility]

## 2023-12-31 NOTE — Telephone Encounter (Signed)
 Unable to reach pt by phone and left v/m requesting pt to cb for triage.sending note to [facility] triage.

## 2024-01-01 NOTE — Telephone Encounter (Signed)
 Noted. Thanks.

## 2024-01-07 ENCOUNTER — Encounter: Payer: Self-pay | Admitting: Family Medicine

## 2024-01-07 ENCOUNTER — Ambulatory Visit (INDEPENDENT_AMBULATORY_CARE_PROVIDER_SITE_OTHER): Admitting: Family Medicine

## 2024-01-07 ENCOUNTER — Telehealth: Payer: Self-pay | Admitting: Family Medicine

## 2024-01-07 VITALS — BP 134/68 | HR 65 | Temp 98.4°F | Ht 69.0 in | Wt 241.6 lb

## 2024-01-07 DIAGNOSIS — R1010 Upper abdominal pain, unspecified: Secondary | ICD-10-CM | POA: Diagnosis not present

## 2024-01-07 DIAGNOSIS — I77810 Thoracic aortic ectasia: Secondary | ICD-10-CM | POA: Insufficient documentation

## 2024-01-07 DIAGNOSIS — R300 Dysuria: Secondary | ICD-10-CM | POA: Diagnosis not present

## 2024-01-07 DIAGNOSIS — J019 Acute sinusitis, unspecified: Secondary | ICD-10-CM

## 2024-01-07 DIAGNOSIS — R109 Unspecified abdominal pain: Secondary | ICD-10-CM

## 2024-01-07 LAB — URINALYSIS, ROUTINE W REFLEX MICROSCOPIC
Bilirubin Urine: NEGATIVE
Hgb urine dipstick: NEGATIVE
Ketones, ur: NEGATIVE
Leukocytes,Ua: NEGATIVE
Nitrite: NEGATIVE
RBC / HPF: NONE SEEN (ref 0–?)
Specific Gravity, Urine: 1.02 (ref 1.000–1.030)
Total Protein, Urine: NEGATIVE
Urine Glucose: NEGATIVE
Urobilinogen, UA: 0.2 (ref 0.0–1.0)
pH: 6 (ref 5.0–8.0)

## 2024-01-07 LAB — CBC WITH DIFFERENTIAL/PLATELET
Basophils Absolute: 0.1 10*3/uL (ref 0.0–0.1)
Basophils Relative: 0.9 % (ref 0.0–3.0)
Eosinophils Absolute: 0.1 10*3/uL (ref 0.0–0.7)
Eosinophils Relative: 1.4 % (ref 0.0–5.0)
HCT: 33.6 % — ABNORMAL LOW (ref 36.0–46.0)
Hemoglobin: 11.1 g/dL — ABNORMAL LOW (ref 12.0–15.0)
Lymphocytes Relative: 21.2 % (ref 12.0–46.0)
Lymphs Abs: 1.6 10*3/uL (ref 0.7–4.0)
MCHC: 33 g/dL (ref 30.0–36.0)
MCV: 86.6 fl (ref 78.0–100.0)
Monocytes Absolute: 0.5 10*3/uL (ref 0.1–1.0)
Monocytes Relative: 6.1 % (ref 3.0–12.0)
Neutro Abs: 5.3 10*3/uL (ref 1.4–7.7)
Neutrophils Relative %: 70.4 % (ref 43.0–77.0)
Platelets: 416 10*3/uL — ABNORMAL HIGH (ref 150.0–400.0)
RBC: 3.88 Mil/uL (ref 3.87–5.11)
RDW: 14.7 % (ref 11.5–15.5)
WBC: 7.5 10*3/uL (ref 4.0–10.5)

## 2024-01-07 LAB — COMPREHENSIVE METABOLIC PANEL WITH GFR
ALT: 17 U/L (ref 0–35)
AST: 17 U/L (ref 0–37)
Albumin: 4.1 g/dL (ref 3.5–5.2)
Alkaline Phosphatase: 148 U/L — ABNORMAL HIGH (ref 39–117)
BUN: 31 mg/dL — ABNORMAL HIGH (ref 6–23)
CO2: 28 meq/L (ref 19–32)
Calcium: 9.3 mg/dL (ref 8.4–10.5)
Chloride: 105 meq/L (ref 96–112)
Creatinine, Ser: 2.11 mg/dL — ABNORMAL HIGH (ref 0.40–1.20)
GFR: 23.94 mL/min — ABNORMAL LOW (ref >60.00)
Glucose, Bld: 88 mg/dL (ref 70–99)
Potassium: 4.7 meq/L (ref 3.5–5.1)
Sodium: 139 meq/L (ref 135–145)
Total Bilirubin: 0.4 mg/dL (ref 0.2–1.2)
Total Protein: 7.3 g/dL (ref 6.0–8.3)

## 2024-01-07 LAB — LIPASE: Lipase: 32 U/L (ref 11.0–59.0)

## 2024-01-07 MED ORDER — AMOXICILLIN-POT CLAVULANATE 500-125 MG PO TABS
1.0000 | ORAL_TABLET | Freq: Two times a day (BID) | ORAL | 0 refills | Status: DC
Start: 2024-01-07 — End: 2024-02-25

## 2024-01-07 NOTE — Telephone Encounter (Signed)
 Order placed re: f/u echo.

## 2024-01-07 NOTE — Assessment & Plan Note (Signed)
 Not discussed at visit but is due for follow-up echo.  See result note.

## 2024-01-07 NOTE — Patient Instructions (Signed)
 Go to the lab on the way out.   If you have mychart we'll likely use that to update you.    If worse in the meantime (more urinary symptoms or facial pain), then start augmentin.  Take care.  Glad to see you.

## 2024-01-07 NOTE — Progress Notes (Signed)
 Abd pain.  Prev noted. Resolved now.  She woke in the middle of the night.  Upper abdominal pain, radiated into B upper abd and through to the back.  It self resolved after a few hours.  Then she was sore for a few days after that but that resolved.  No known renal stone passage.  Some nausea at the time. H/o gastric bypass.  She still has her gall bladder.   TUMS didn't help acutely.    She had a sore throat for about 2-3 weeks.  Initially she noted discomfort after swallowing, now discomfort with swallowing.  No pain when not swallowing but "it doesn't feel right".  No fevers, no chills.  No cough.  No rhinorrhea.  No facial pain.  Some occ HA recently, around her eyes.   Dysuria, urgency and frequency.  No pain.  No fevers.    She had occ sx, usually when really tired/overworked, when she had pain between the shoulder blades, midline.  At rest.  Not going on now.    Meds, vitals, and allergies reviewed.   ROS: Per HPI unless specifically indicated in ROS section   Nad Ncat Nasal exam stuffy Neck supple, no LA Sinuses ttp, max greater than frontal, B.  Rrr Ctab Abd soft, not ttp Skin well perfused.  No BLE edema.

## 2024-01-07 NOTE — Assessment & Plan Note (Signed)
 Hold Augmentin for now.  If any progressive dysuria then she can start.  See notes and labs.

## 2024-01-07 NOTE — Assessment & Plan Note (Signed)
 Possible return of sinusitis.  Hold Augmentin for now and if any progressive symptoms then start.

## 2024-01-07 NOTE — Assessment & Plan Note (Signed)
 Of unclear source.  Reasonable to check labs today.  No symptoms now.  Benign abdominal exam.

## 2024-01-08 LAB — URINE CULTURE
MICRO NUMBER:: 16326021
SPECIMEN QUALITY:: ADEQUATE

## 2024-01-09 NOTE — Telephone Encounter (Addendum)
 Spoke with patient - no significant abd pain but notes ongoing mid shoulder blade discomfort.  She wonders if this was exacerbated from doing spring cleaning yesterday.  Offered RUQ US  as she had elevated alk phos in labs - she prefers to give it a few days to see if improvement noted.  UCx returned negative for infection. She held off on starting augmentin course.   No fever, nausea/vomiting, jaundice or icterus.   She will let us  know if symptoms return or worsen for further imaging.

## 2024-01-13 NOTE — Telephone Encounter (Signed)
 Noted. Thanks.

## 2024-02-04 ENCOUNTER — Ambulatory Visit: Attending: Family Medicine

## 2024-02-04 DIAGNOSIS — I080 Rheumatic disorders of both mitral and aortic valves: Secondary | ICD-10-CM

## 2024-02-04 DIAGNOSIS — I77810 Thoracic aortic ectasia: Secondary | ICD-10-CM | POA: Diagnosis not present

## 2024-02-04 DIAGNOSIS — I5030 Unspecified diastolic (congestive) heart failure: Secondary | ICD-10-CM

## 2024-02-04 LAB — ECHOCARDIOGRAM COMPLETE
AR max vel: 3.74 cm2
AV Area VTI: 3.76 cm2
AV Area mean vel: 3.62 cm2
AV Mean grad: 4 mmHg
AV Peak grad: 6.9 mmHg
Ao pk vel: 1.31 m/s
Area-P 1/2: 2.42 cm2
S' Lateral: 3.61 cm

## 2024-02-11 ENCOUNTER — Other Ambulatory Visit: Payer: Self-pay | Admitting: Family Medicine

## 2024-02-11 ENCOUNTER — Ambulatory Visit: Payer: Self-pay | Admitting: Family Medicine

## 2024-02-11 DIAGNOSIS — I77810 Thoracic aortic ectasia: Secondary | ICD-10-CM

## 2024-02-12 ENCOUNTER — Encounter: Payer: Self-pay | Admitting: Family Medicine

## 2024-02-12 NOTE — Telephone Encounter (Signed)
 Copied from CRM [id number]. Topic: General - Other >> Feb 12, 2024 12:58 PM Howard Macho wrote: Reason for CRM: patient returning a call to [facility] at the office and she stated its best that he reach her through [place] because she was on her lunch returning the call

## 2024-02-23 NOTE — Progress Notes (Unsigned)
 Cardiology Clinic Note   Date: 02/25/2024 ID: Luvia, Orzechowski 24-Apr-1956, MRN [id number]  Primary Cardiologist:  Belva Boyden, MD  Chief Complaint   Carmen Buchanan is a 68 y.o. female who presents to the clinic today for evaluation of aortic dilatation.   Patient Profile   Carmen Buchanan is followed by Dr. Gollan for the history outlined below.      Past medical history significant for: Palpitations. 4-day ZIO 05/19/2021: HR 52 to 122 bpm, average 71 bpm.  Predominantly sinus rhythm.  2 runs of SVT/A. tach fastest 7 beats max rate 122 bpm, longest 11 beats average rate 108 bpm.  Rare ectopy. Ascending aorta dilatation. Echo 02/04/2024: EF 55 to 60%.  No RWMA.  Grade I DD.  Normal RV size/function.  Mild MR/AI.  Moderate dilatation of ascending aorta 47 mm.  (Prior measurement 01/05/2023 45 mm). CKD. Obesity S/p gastric bypass.  In summary, patient was first evaluated by Dr. Gollan on 05/09/2021 for tachycardia at the request of Dr. Vallarie Gauze.  Patient reported erratic episodes of tachycardia described as heart racing for few minutes and resolving on its own occurring since May 2022.  EKG demonstrated normal sinus rhythm.  Family history of CAD/MI in father and mother with fused aortic valve and ascending aortic dilatation.  4-day ZIO demonstrated 2 runs of SVT/A. tach.  She was not seen in follow-up.  Patient was referred to cardiology secondary to aortic dilatation seen on echo larger than previous.     History of Present Illness    Today, patient is here alone. She reports over the last 6-8 months she notes exertional chest tightness "a lot of the time" with associated dyspnea and occasionally with palpitations. She will also notice some pain between her shoulder blades with the tightness and PCP is considering doing a gallbladder US  for further evaluation. Symptoms resolve with rest and she is able to return to the activity, however, symptoms can return and she will have to rest  again. She also has palpitations without chest tightness described as racing that typically occur with exertion and resolve with rest. She denies prolonged episodes of palpitations. She mentions that she has occasional episodes of weakness with associated flushed feeling followed by palpitations that requires longer rest period to resolve. She initially felt these episodes could be related to a drop in her BP but when she checked her BP during an episode it was normal. She wonders if her blood sugar is dropping. She does not have diabetes and looking back at fasting labs her blood sugar is never high. She reports episodes happen even when she has eaten. She denies lower extremity edema. She sleeps on a recliner secondary to back pain. 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 Exercise is limited secondary to back pain and bilateral knee pain.     ROS: All other systems reviewed and are otherwise negative except as noted in History of Present Illness.  EKGs/Labs Reviewed    EKG Interpretation Date/Time:  Monday February 25 2024 13:22:49 EDT Ventricular Rate:  68 PR Interval:  172 QRS Duration:  78 QT Interval:  390 QTC Calculation: 414 R Axis:   7  Text Interpretation: Normal sinus rhythm Normal ECG When compared with ECG of 21-Nov-2022 15:22, PREVIOUS ECG IS PRESENT Confirmed by Morey Ar [phone]) on 02/25/2024 1:32:14 PM   01/07/2024: ALT 17; AST 17; BUN 31; Creatinine, Ser 2.11; Potassium 4.7; Sodium 139   01/07/2024: Hemoglobin 11.1; WBC 7.5  Physical Exam    VS:  BP 126/70 (BP Location: Right Arm, Cuff Size: Large)   Pulse 68   Ht 5\' 9"  (1.753 m)   Wt 243 lb 8 oz (110.5 kg)   SpO2 98%   BMI 35.96 kg/m  , BMI Body mass index is 35.96 kg/m.  GEN: Well nourished, well developed, in no acute distress. Neck: No JVD or carotid bruits. Cardiac:  RRR. No murmurs. No rubs or gallops.   Respiratory:  Respirations regular and unlabored. Clear to auscultation without rales,  wheezing or rhonchi. GI: Soft, nontender, nondistended. Extremities: Radials/DP/PT 2+ and equal bilaterally. No clubbing or cyanosis. No edema.  Skin: Warm and dry, no rash. Neuro: Strength intact.  Assessment & Plan   Chest pain Patient reports a 6-8 month history of exertional chest tightness with associated dyspnea and occasionally with palpitations. There are times she has pain  between her shoulder blades as well. Pain resolves with rest and she is able to return to activity but it will sometimes come back requiring her to rest again. She also mentions episodes of weakness with associated flushing and presyncope that resolves with longer rest breaks. She initially thought it could be related to BP dropping but her BP was normal when she checked it during an episode. EKG without acute changes.  - Schedule PET CT.   Palpitations 4-day ZIO August 2022 showed HR 52 to 122 bpm, average 71 bpm, 2 runs of SVT/A. tach fastest 7 beats max rate 122 bpm, longest 11 beats average rate 108 bpm.  Patient reports palpitations with and without chest tightness and typically related to exertion. Palpitations described as racing and resolve with rest. She has never had a prolonged episode.  - Continue to monitor.   Ascending aortic dilatation Echo 02/04/2024 showed normal LV/RV function, Grade I DD, mild MR/AI, moderate dilatation of ascending aorta 47 mm (prior measurement April 2024 45 mm).  Patient is having episodes of chest tightness and back pain between shoulder blades as detailed above. She will undergo ischemic evaluation. Patient would like to defer ordering of repeat surveillance to PCP. I think it is reasonable to repeat testing in one year.  - Educated patient on restrictions/activities to avoid such as high intensity exercise, heavy lifting greater than 30 pounds, straining/bearing down, smoking, and other strenuous activities. - Repeat surveillance per PCP.   Disposition: Cardiac PET CT. Return  in 6-8 weeks or sooner as needed.      Informed Consent   Shared Decision Making/Informed Consent The risks [chest pain, shortness of breath, cardiac arrhythmias, dizziness, blood pressure fluctuations, myocardial infarction, stroke/transient ischemic attack, nausea, vomiting, allergic reaction, radiation exposure, metallic taste sensation and life-threatening complications (estimated to be 1 in 10,000)], benefits (risk stratification, diagnosing coronary artery disease, treatment guidance) and alternatives of a cardiac PET stress test were discussed in detail with Ms. Bud Care and she agrees to proceed.      Signed, Carmen Buchanan. Carmen Golladay, DNP, NP-C

## 2024-02-25 ENCOUNTER — Ambulatory Visit: Attending: Student | Admitting: Student

## 2024-02-25 ENCOUNTER — Encounter: Payer: Self-pay | Admitting: Student

## 2024-02-25 VITALS — BP 126/70 | HR 68 | Ht 69.0 in | Wt 243.5 lb

## 2024-02-25 DIAGNOSIS — I77819 Aortic ectasia, unspecified site: Secondary | ICD-10-CM

## 2024-02-25 DIAGNOSIS — R002 Palpitations: Secondary | ICD-10-CM

## 2024-02-25 DIAGNOSIS — E790 Hyperuricemia without signs of inflammatory arthritis and tophaceous disease: Secondary | ICD-10-CM | POA: Diagnosis not present

## 2024-02-25 DIAGNOSIS — R072 Precordial pain: Secondary | ICD-10-CM

## 2024-02-25 DIAGNOSIS — N184 Chronic kidney disease, stage 4 (severe): Secondary | ICD-10-CM | POA: Diagnosis not present

## 2024-02-25 DIAGNOSIS — N2581 Secondary hyperparathyroidism of renal origin: Secondary | ICD-10-CM | POA: Diagnosis not present

## 2024-02-25 DIAGNOSIS — R8290 Unspecified abnormal findings in urine: Secondary | ICD-10-CM | POA: Diagnosis not present

## 2024-02-25 NOTE — Patient Instructions (Signed)
 Medication Instructions:  Your Physician recommend you continue on your current medication as directed.    *If you need a refill on your cardiac medications before your next appointment, please call your pharmacy*  Lab Work: None ordered at this time  If you have labs (blood work) drawn today and your tests are completely normal, you will receive your results only by: MyChart Message (if you have MyChart) OR A paper copy in the mail If you have any lab test that is abnormal or we need to change your treatment, we will call you to review the results.  Testing/Procedures:    Please report to Radiology at the [facility] Main Entrance 30 minutes early for your test.  [address] [place], [place] [address]                         OR   Please report to Radiology at [facility] Main Entrance, medical mall, 30 mins prior to your test.  [address]  [place], [place]  How to Prepare for Your Cardiac PET/CT Stress Test:  Nothing to eat or drink, except water, 3 hours prior to arrival time.  NO caffeine/decaffeinated products, or chocolate 12 hours prior to arrival. (Please note decaffeinated beverages (teas/coffees) still contain caffeine).  If you have caffeine within 12 hours prior, the test will need to be rescheduled.  Medication instructions: Do not take erectile dysfunction medications for 72 hours prior to test (sildenafil, tadalafil) Do not take nitrates (isosorbide mononitrate, Ranexa) the day before or day of test Do not take tamsulosin the day before or morning of test Hold theophylline containing medications for 12 hours. Hold Dipyridamole 48 hours prior to the test.  Diabetic Preparation: If able to eat breakfast prior to 3 hour fasting, you may take all medications, including your insulin. Do not worry if you miss your breakfast dose of insulin - start at your next meal. If you do not eat prior to 3 hour fast-Hold all diabetes  (oral and insulin) medications. Patients who wear a continuous glucose monitor MUST remove the device prior to scanning.  You may take your remaining medications with water.  NO perfume, cologne or lotion on chest or abdomen area. FEMALES - Please avoid wearing dresses to this appointment.  Total time is 1 to 2 hours; you may want to bring reading material for the waiting time.  IF YOU THINK YOU MAY BE PREGNANT, OR ARE NURSING PLEASE INFORM THE TECHNOLOGIST.  In preparation for your appointment, medication and supplies will be purchased.  Appointment availability is limited, so if you need to cancel or reschedule, please call the Radiology Department Scheduler at [phone] 24 hours in advance to avoid a cancellation fee of $100.00  What to Expect When you Arrive:  Once you arrive and check in for your appointment, you will be taken to a preparation room within the Radiology Department.  A technologist or Nurse will obtain your medical history, verify that you are correctly prepped for the exam, and explain the procedure.  Afterwards, an IV will be started in your arm and electrodes will be placed on your skin for EKG monitoring during the stress portion of the exam. Then you will be escorted to the PET/CT scanner.  There, staff will get you positioned on the scanner and obtain a blood pressure and EKG.  During the exam, you will continue to be connected to the EKG and blood pressure  machines.  A small, safe amount of a radioactive tracer will be injected in your IV to obtain a series of pictures of your heart along with an injection of a stress agent.    After your Exam:  It is recommended that you eat a meal and drink a caffeinated beverage to counter act any effects of the stress agent.  Drink plenty of fluids for the remainder of the day and urinate frequently for the first couple of hours after the exam.  Your doctor will inform you of your test results within 7-10 business days.  For  more information and frequently asked questions, please visit our website: [email or web address]  For questions about your test or how to prepare for your test, please call: Cardiac Imaging Nurse Navigators Office: [phone]   Follow-Up: At [facility], you and your health needs are our priority.  As part of our continuing mission to provide you with exceptional heart care, our providers are all part of one team.  This team includes your primary Cardiologist (physician) and Advanced Practice Providers or APPs (Physician Assistants and Nurse Practitioners) who all work together to provide you with the care you need, when you need it.  Your next appointment:   6 - 8 week(s) (After the PET Scan)  Provider:   You may see Timothy Gollan, MD or one of the following Advanced Practice Providers on your designated Care Team:   Laneta Pintos, NP Gildardo Labrador, PA-C Varney Gentleman, PA-C Cadence [place], PA-C Ronald Cockayne, NP Morey Ar, NP    We recommend signing up for the patient portal called "MyChart".  Sign up information is provided on this After Visit Summary.  MyChart is used to connect with patients for Virtual Visits (Telemedicine).  Patients are able to view lab/test results, encounter notes, upcoming appointments, etc.  Non-urgent messages can be sent to your provider as well.   To learn more about what you can do with MyChart, go to [email or web address].

## 2024-02-28 DIAGNOSIS — I10 Essential (primary) hypertension: Secondary | ICD-10-CM | POA: Diagnosis not present

## 2024-02-28 DIAGNOSIS — N2581 Secondary hyperparathyroidism of renal origin: Secondary | ICD-10-CM | POA: Diagnosis not present

## 2024-02-28 DIAGNOSIS — N390 Urinary tract infection, site not specified: Secondary | ICD-10-CM | POA: Diagnosis not present

## 2024-02-28 DIAGNOSIS — N184 Chronic kidney disease, stage 4 (severe): Secondary | ICD-10-CM | POA: Diagnosis not present

## 2024-03-10 ENCOUNTER — Encounter: Payer: Self-pay | Admitting: Family Medicine

## 2024-03-10 ENCOUNTER — Ambulatory Visit (INDEPENDENT_AMBULATORY_CARE_PROVIDER_SITE_OTHER): Admitting: Family Medicine

## 2024-03-10 VITALS — BP 132/68 | HR 67 | Temp 98.7°F | Ht 69.0 in | Wt 243.2 lb

## 2024-03-10 DIAGNOSIS — R21 Rash and other nonspecific skin eruption: Secondary | ICD-10-CM | POA: Diagnosis not present

## 2024-03-10 MED ORDER — NYSTATIN 100000 UNIT/GM EX POWD: 1.0000 | Freq: Two times a day (BID) | CUTANEOUS | 1 refills | Status: DC

## 2024-03-10 MED ORDER — DOXYCYCLINE HYCLATE 100 MG PO TABS
100.0000 mg | ORAL_TABLET | Freq: Two times a day (BID) | ORAL | 0 refills | Status: DC
Start: 2024-03-10 — End: 2024-03-18

## 2024-03-10 MED ORDER — FLUCONAZOLE 100 MG PO TABS: 100.0000 mg | ORAL_TABLET | ORAL | 0 refills | Status: DC

## 2024-03-10 NOTE — Patient Instructions (Addendum)
 Fluconazole  daily for now, when improving change to every 3rd day.  Start doxycycline .  Use nystatin powder twice a day in the meantime.  If a fever, then go to the ER.  Update me about your situation in about 2 days.   Take care.  Glad to see you.

## 2024-03-10 NOTE — Progress Notes (Signed)
 Rash.  Had taken fluconazole . No change with med use.  No fevers known but the area feels hot locally.  Not on keflex  currently.  Sig local pain.  Had used nystatin cream locally.  Started 03/06/24.  Rashes in the right greater than left groin, along the inferior margin of the pannus.  Meds, vitals, and allergies reviewed.   ROS: Per HPI unless specifically indicated in ROS section   Nad Ncat Chaperoned exam. Abdomen soft.  Abdomen itself is not tender to palpation but she has an irritated rash. R>L lower abd rash with denuded skin w/o ulceration or fluctuant mass.

## 2024-03-12 ENCOUNTER — Encounter: Payer: Self-pay | Admitting: Family Medicine

## 2024-03-13 ENCOUNTER — Other Ambulatory Visit: Payer: Self-pay | Admitting: Family Medicine

## 2024-03-13 MED ORDER — NYSTATIN 100000 UNIT/GM EX POWD: 1.0000 | Freq: Two times a day (BID) | CUTANEOUS | 1 refills | Status: DC

## 2024-03-13 NOTE — Assessment & Plan Note (Signed)
 It looks like she has a superficial fungal infection but with the amount of skin involved there is the concern for secondary skin infection.  Discussed options.  Okay for outpatient follow-up. Fluconazole  daily for now, when improving change to every 3rd day.  Start doxycycline .  Use nystatin powder twice a day in the meantime.  If a fever, then go to the ER.  I asked her to update me about her situation in about 2 days.

## 2024-03-17 ENCOUNTER — Ambulatory Visit: Admitting: Family Medicine

## 2024-03-18 ENCOUNTER — Encounter: Payer: Self-pay | Admitting: Family Medicine

## 2024-03-18 ENCOUNTER — Ambulatory Visit (INDEPENDENT_AMBULATORY_CARE_PROVIDER_SITE_OTHER): Admitting: Family Medicine

## 2024-03-18 VITALS — BP 128/66 | HR 69 | Temp 98.7°F | Ht 69.0 in | Wt 240.8 lb

## 2024-03-18 DIAGNOSIS — R21 Rash and other nonspecific skin eruption: Secondary | ICD-10-CM

## 2024-03-18 MED ORDER — FLUCONAZOLE 100 MG PO TABS: 100.0000 mg | ORAL_TABLET | ORAL | 0 refills | Status: DC

## 2024-03-18 MED ORDER — NYSTATIN 100000 UNIT/GM EX POWD: 1.0000 | Freq: Two times a day (BID) | CUTANEOUS | 1 refills | Status: DC

## 2024-03-18 NOTE — Progress Notes (Unsigned)
 She finished her course of doxy.  Med list updated.  She feels better.  No fevers.  No pain but some discomfort.  But it's nothing like next week.  She is taking diflucan  every 3 days now, last dose was 03/15/24.  Still using nystatin  powder.    New spot that is similar under the R breast but not as severe as the larger rash last week.    Meds, vitals, and allergies reviewed.   ROS: Per HPI unless specifically indicated in ROS section   Nad Ncat Rrr Ctab Abd soft, not ttp Ext w/o edema.  Chaperoned exam.   Minimal fungal changes under the R breast.  Inguinal rash R>L is clearly better with residual post inflammatory changes.

## 2024-03-18 NOTE — Patient Instructions (Addendum)
 I would keep taking diflucan  every 3 days and keep using nystatin  twice a day.   I wouldn't restart doxy now.   Unclear how much of the inguinal rash is active infection/irritation vs post inflammatory changes.    If the rash is stable over the next week, then I would stop diflucan .   If the rash remains stable after that, then can stop nystatin .   If any worse, then restart nystatin  and diflucan  and update me with a phone call.    Take care.  Glad to see you.

## 2024-03-19 NOTE — Assessment & Plan Note (Signed)
 Discussed options.  I would keep taking diflucan  every 3 days and keep using nystatin  twice a day. I wouldn't restart doxy now.   Unclear how much of the inguinal rash is active infection/irritation vs post inflammatory changes.    If the rash is stable over the next week, then I would stop diflucan .   If the rash remains stable after that, then can stop nystatin .   If any worse, then restart nystatin  and diflucan  and update me with a phone call.    Continue treatment with topical nystatin  should help the rash under the breast.  That looks like a typical and limited superficial fungal infection.

## 2024-03-20 ENCOUNTER — Encounter: Payer: Self-pay | Admitting: Family Medicine

## 2024-03-23 ENCOUNTER — Other Ambulatory Visit: Payer: Self-pay | Admitting: Family Medicine

## 2024-03-23 MED ORDER — LORATADINE 10 MG PO TABS
10.0000 mg | ORAL_TABLET | ORAL | Status: AC | PRN
Start: 2024-03-23 — End: ?

## 2024-03-27 ENCOUNTER — Ambulatory Visit

## 2024-03-27 VITALS — Ht 69.0 in | Wt 237.0 lb

## 2024-03-27 DIAGNOSIS — Z0000 Encounter for general adult medical examination without abnormal findings: Secondary | ICD-10-CM

## 2024-03-27 NOTE — Progress Notes (Signed)
 Subjective:   Carmen Buchanan is a 68 y.o. who presents for a Medicare Wellness preventive visit.  As a reminder, Annual Wellness Visits don't include a physical exam, and some assessments may be limited, especially if this visit is performed virtually. We may recommend an in-person follow-up visit with your provider if needed.  Visit Complete: Virtual I connected with  Carmen Buchanan on 03/27/24 by a audio enabled telemedicine application and verified that I am speaking with the correct person using two identifiers.  Patient Location: Home  Provider Location: Home Office  I discussed the limitations of evaluation and management by telemedicine. The patient expressed understanding and agreed to proceed.  Vital Signs: Because this visit was a virtual/telehealth visit, some criteria may be missing or patient reported. Any vitals not documented were not able to be obtained and vitals that have been documented are patient reported.  VideoDeclined- This patient declined Librarian, academic. Therefore the visit was completed with audio only.  Persons Participating in Visit: Patient.  AWV Questionnaire: Yes: Patient Medicare AWV questionnaire was completed by the patient on 03/27/24; I have confirmed that all information answered by patient is correct and no changes since this date.  Cardiac Risk Factors include: advanced age (>66men, >64 women);hypertension;obesity (BMI >30kg/m2)     Objective:    Today's Vitals   03/27/24 1004 03/27/24 1132  Weight:  237 lb (107.5 kg)  Height:  5' 9 (1.753 m)  PainSc: 2     Body mass index is 35 kg/m.     03/27/2024   11:39 AM 01/24/2023    9:13 AM 11/21/2022    2:26 PM 02/28/2022   10:21 AM 02/17/2021    6:00 PM 07/16/2013    8:00 AM 07/14/2013    3:54 PM  Advanced Directives  Does Patient Have a Medical Advance Directive? No No No No No Patient does not have advance directive  Patient does not have advance directive    Would patient like information on creating a medical advance directive?  Yes (ED - Information included in AVS)  No - Patient declined No - Patient declined       Data saved with a previous flowsheet row definition    Current Medications (verified) Outpatient Encounter Medications as of 03/27/2024  Medication Sig   allopurinol (ZYLOPRIM) 100 MG tablet Take 100 mg by mouth daily.   buPROPion  (WELLBUTRIN  SR) 150 MG 12 hr tablet TAKE 1 TABLET BY MOUTH TWICE A DAY   fluconazole  (DIFLUCAN ) 100 MG tablet Take 1 tablet (100 mg total) by mouth every 3 (three) days.   FLUoxetine  (PROZAC ) 40 MG capsule TAKE 2 CAPSULES BY MOUTH EVERY DAY   loratadine (CLARITIN) 10 MG tablet Take 1 tablet (10 mg total) by mouth every other day as needed for itching.   LORazepam  (ATIVAN ) 0.5 MG tablet Take 1 tablet (0.5 mg total) by mouth daily as needed.   nystatin  (MYCOSTATIN /NYSTOP ) powder Apply 1 Application topically in the morning and at bedtime. Patient may need early refill due to quantity required   omeprazole  (PRILOSEC) 20 MG capsule TAKE 1 CAPSULE IN THE MORNING AND AT BEDTIME.   polyethylene glycol (MIRALAX  / GLYCOLAX ) packet Take 17 g by mouth daily.   No facility-administered encounter medications on file as of 03/27/2024.    Allergies (verified) Ciprofloxacin, Latex, Lisinopril, Morphine, and Nsaids   History: Past Medical History:  Diagnosis Date   Anemia    past hx since bariatric surgery 2012- Iron infusion last  09-2019   Anxiety    much improved after weight loss started in late 2012   Arthritis    CKD (chronic kidney disease) stage 3, GFR 30-59 ml/min (HCC)    Complication of anesthesia    after hemorrhoid surgery-inability to sleep for 3 weeks   GERD (gastroesophageal reflux disease)    Hemorrhoid    HTN (hypertension)    stopped meds after bariatric surgery   IBS (irritable bowel syndrome)    intermittent   Migraine, unspecified, without mention of intractable migraine without mention  of status migrainosus    Obesity    Renal stones    Past Surgical History:  Procedure Laterality Date   ABLATION     uterus   CESAREAN SECTION     CYSTOSCOPY N/A 07/14/2013   Procedure: CYSTOSCOPY;  Surgeon: Ricardo Likens, MD;  Location: [facility];  Service: Urology;  Laterality: N/A;   GASTRIC BYPASS     sleeve procedure 06/2011 in [facility] Mini Bypass  2012   HEMORRHOID SURGERY  2013   LASER ABLATION CONDYLOMA CERVICAL / VULVAR  5/04   uncontrollable bleeding   LUMBAR DISC SURGERY  7/01   L4-5; Dr. Drury   NEPHROLITHOTOMY Right 07/14/2013   Procedure: NEPHROLITHOTOMY PERCUTANEOUS  Procedure: Right 1st stage Percutaneous Nephrostolithotomy with surgeon access and cystoscopy;  Surgeon: Ricardo Likens, MD;  Location: [facility];  Service: Urology;  Laterality: Right;   NEPHROLITHOTOMY Right 07/16/2013   Procedure: NEPHROLITHOTOMY PERCUTANEOUS SECOND LOOK, diagnostic ureteroscopy and stent placement on the right ;  Surgeon: Ricardo Likens, MD;  Location: [facility];  Service: Urology;  Laterality: Right;   TONSILLECTOMY AND ADENOIDECTOMY  1970   Family History  Problem Relation Age of Onset   Coronary artery disease Father        MI; CABG   Colon polyps Father    Aortic aneurysm Mother        on beta blocker   Other Mother        Cardiovascualr disease   Ulcers Mother    Colon polyps Mother    Heart disease Mother    Colon polyps Other        Family history   Colon cancer Neg Hx    Breast cancer Neg Hx    Esophageal cancer Neg Hx    Rectal cancer Neg Hx    Stomach cancer Neg Hx    Social History   Socioeconomic History   Marital status: Married    Spouse name: Not on file   Number of children: 1   Years of education: Not on file   Highest education level: 12th grade  Occupational History   Occupation: Sales executive  Tobacco Use   Smoking status: Never   Smokeless tobacco: Never  Vaping Use   Vaping status: Never Used  Substance and Sexual Activity    Alcohol use: Not Currently   Drug use: No   Sexual activity: Not on file  Other Topics Concern   Not on file  Social History Narrative   Married; 1982   1 son   Sales executive in [employer]   Likes to [employer], church, time with family   Minimal exercise   Social Drivers of Health   Financial Resource Strain: Low Risk  (03/27/2024)   Overall Financial Resource Strain (CARDIA)    Difficulty of Paying Living Expenses: Not hard at all  Food Insecurity: No Food Insecurity (03/27/2024)   Hunger Vital Sign    Worried About  Running Out of Food in the Last Year: Never true    Ran Out of Food in the Last Year: Never true  Transportation Needs: No Transportation Needs (03/27/2024)   PRAPARE - Administrator, Civil Service (Medical): No    Lack of Transportation (Non-Medical): No  Physical Activity: Insufficiently Active (03/27/2024)   Exercise Vital Sign    Days of Exercise per Week: 2 days    Minutes of Exercise per Session: 30 min  Stress: Stress Concern Present (03/27/2024)   [employer] of Occupational Health - Occupational Stress Questionnaire    Feeling of Stress: Rather much  Social Connections: Socially Integrated (03/27/2024)   Social Connection and Isolation Panel    Frequency of Communication with Friends and Family: More than three times a week    Frequency of Social Gatherings with Friends and Family: Once a week    Attends Religious Services: More than 4 times per year    Active Member of [employer] or Organizations: Yes    Attends Engineer, structural: More than 4 times per year    Marital Status: Married    Tobacco Counseling Counseling given: Not Answered    Clinical Intake:  Pre-visit preparation completed: Yes  Pain : 0-10 Pain Score: 2  Pain Type: Chronic pain Pain Location: Back Pain Orientation: Lower Pain Descriptors / Indicators: Aching Pain Onset: More than a month ago Pain Frequency: Intermittent Pain Relieving Factors:  tylenol ,heat Effect of Pain on Daily Activities: lessened sometimes  Pain Relieving Factors: tylenol ,heat     No results found for: HGBA1C             Activities of Daily Living     03/27/2024   10:04 AM 01/30/2024    7:56 PM  In your present state of health, do you have any difficulty performing the following activities:  Hearing? 0 0  Vision? 1 1  Difficulty concentrating or making decisions? 1 1  Walking or climbing stairs? 1 1  Dressing or bathing? 0   Doing errands, shopping? 0 0  Preparing Food and eating ? N N  Using the Toilet? N N  In the past six months, have you accidently leaked urine? Y Y  Do you have problems with loss of bowel control? N N  Managing your Medications? N N  Managing your Finances? N N  Housekeeping or managing your Housekeeping? Carmen Buchanan Carmen Buchanan    Patient Care Team: Cleatus Arlyss RAMAN, MD as PCP - General Gollan, Evalene PARAS, MD as PCP - Cardiology (Cardiology) Mat Browning, MD as Consulting Physician (Obstetrics and Gynecology) [facility], [place]  I have updated your Care Teams any recent Medical Services you may have received from other providers in the past year.     Assessment:   This is a routine wellness examination for Carmen Buchanan.  Hearing/Vision screen Hearing Screening - Comments:: Pt says her hearing is good Vision Screening - Comments:: Pt says her vision is good Dr [facility]   Goals Addressed             This Visit's Progress    Weight (lb) < 200 lb (90.7 kg)   237 lb (107.5 kg)    I would like to be more disciplined with healthy eating and being a little more active.       Depression Screen     03/27/2024   11:35 AM 03/10/2024   10:00 AM 01/07/2024   10:39 AM 10/04/2023   11:57 AM 09/18/2023  10:06 AM 07/12/2023    3:20 PM 05/14/2023    3:12 PM  PHQ 2/9 Scores  PHQ - 2 Score 2 4 4 2 3 2 2   PHQ- 9 Score  16 14 7 11 9 10     Fall Risk     03/27/2024   10:04 AM 03/10/2024   10:00 AM 01/30/2024    7:56 PM  01/07/2024   10:39 AM 10/04/2023   11:57 AM  Fall Risk   Falls in the past year? 0 0 0 0 0  Number falls in past yr: 0 0 0 0 0  Injury with Fall? 0  0 0 0  Risk for fall due to : No Fall Risks No Fall Risks  No Fall Risks No Fall Risks  Follow up Education provided Falls evaluation completed  Falls evaluation completed Falls evaluation completed    MEDICARE RISK AT HOME:  Medicare Risk at Home Any stairs in or around the home?: (Patient-Rptd) No Home free of loose throw rugs in walkways, pet beds, electrical cords, etc?: (Patient-Rptd) Yes Adequate lighting in your home to reduce risk of falls?: (Patient-Rptd) Yes Life alert?: (Patient-Rptd) No Use of a cane, walker or w/c?: (Patient-Rptd) No Grab bars in the bathroom?: (Patient-Rptd) No Elevated toilet seat or a handicapped toilet?: (Patient-Rptd) No  TIMED UP AND GO:  Was the test performed?  No  Cognitive Function: 6CIT completed        03/27/2024   11:43 AM 01/24/2023    9:17 AM  6CIT Screen  What Year? 0 points 0 points  What month? 0 points 0 points  What time? 0 points 0 points  Count back from 20 0 points 0 points  Months in reverse 0 points 0 points  Repeat phrase 0 points 0 points  Total Score 0 points 0 points    Immunizations Immunization History  Administered Date(s) Administered   Fluad Quad(high Dose 65+) 08/07/2022   Influenza, High Dose Seasonal PF 11/19/2023   Influenza,inj,Quad PF,6+ Mos 07/17/2013, 11/10/2019, 07/08/2020   Moderna Sars-Covid-2 Vaccination 12/09/2019, 12/11/2019, 01/08/2020, 01/23/2020, 07/25/2020   Tdap 11/10/2019    Screening Tests Health Maintenance  Topic Date Due   HEMOGLOBIN A1C  Never done   FOOT EXAM  Never done   OPHTHALMOLOGY EXAM  Never done   Diabetic kidney evaluation - Urine ACR  Never done   Pneumococcal Vaccine: 50+ Years (1 of 2 - PCV) Never done   Zoster Vaccines- Shingrix (1 of 2) Never done   COVID-19 Vaccine (6 - 2024-25 season) 05/27/2023   MAMMOGRAM   07/11/2024 (Originally 09/07/2021)   INFLUENZA VACCINE  04/25/2024   Diabetic kidney evaluation - eGFR measurement  01/06/2025   Medicare Annual Wellness (AWV)  03/27/2025   DTaP/Tdap/Td (2 - Td or Tdap) 11/09/2029   Colonoscopy  12/30/2029   DEXA SCAN  Completed   Hepatitis C Screening  Completed   Hepatitis B Vaccines  Aged Out   HPV VACCINES  Aged Out   Meningococcal B Vaccine  Aged Out    Health Maintenance  Health Maintenance Due  Topic Date Due   HEMOGLOBIN A1C  Never done   FOOT EXAM  Never done   OPHTHALMOLOGY EXAM  Never done   Diabetic kidney evaluation - Urine ACR  Never done   Pneumococcal Vaccine: 50+ Years (1 of 2 - PCV) Never done   Zoster Vaccines- Shingrix (1 of 2) Never done   COVID-19 Vaccine (6 - 2024-25 season) 05/27/2023   Health Maintenance Items  Addressed: None at this time (says addressed/done by GYN yearly)  Additional Screening:  Vision Screening: Recommended annual ophthalmology exams for early detection of glaucoma and other disorders of the eye. Would you like a referral to an eye doctor? No    Dental Screening: Recommended annual dental exams for proper oral hygiene  Community Resource Referral / Chronic Care Management: CRR required this visit?  No   CCM required this visit?  No   Plan:    I have personally reviewed and noted the following in the patient's chart:   Medical and social history Use of alcohol, tobacco or illicit drugs  Current medications and supplements including opioid prescriptions. Patient is not currently taking opioid prescriptions. Functional ability and status Nutritional status Physical activity Advanced directives List of other physicians Hospitalizations, surgeries, and ER visits in previous 12 months Vitals Screenings to include cognitive, depression, and falls Referrals and appointments  In addition, I have reviewed and discussed with patient certain preventive protocols, quality metrics, and best  practice recommendations. A written personalized care plan for preventive services as well as general preventive health recommendations were provided to patient.   Erminio LITTIE Saris, LPN   10/31/7972   After Visit Summary: (MyChart) Due to this being a telephonic visit, the after visit summary with patients personalized plan was offered to patient via MyChart   Notes: Nothing significant to report at this time.

## 2024-03-27 NOTE — Patient Instructions (Signed)
 Carmen Buchanan , Thank you for taking time out of your busy schedule to complete your Annual Wellness Visit with me. I enjoyed our conversation and look forward to speaking with you again next year. I, as well as your care team,  appreciate your ongoing commitment to your health goals. Please review the following plan we discussed and let me know if I can assist you in the future. Your Game plan/ To Do List     Follow up Visits: Next Medicare AWV with our clinical staff: 04/01/25 @ 11:30am televisit   Have you seen your provider in the last 6 months (3 months if uncontrolled diabetes)? Yes Next Office Visit with your provider: none scheduled (pt says she does PE w/GYN)  Clinician Recommendations:  Aim for 30 minutes of exercise or brisk walking, 6-8 glasses of water, and 5 servings of fruits and vegetables each day.       This is a list of the screening recommended for you and due dates:  Health Maintenance  Topic Date Due   Hemoglobin A1C  Never done   Complete foot exam   Never done   Eye exam for diabetics  Never done   Yearly kidney health urinalysis for diabetes  Never done   Pneumococcal Vaccine for age over 27 (1 of 2 - PCV) Never done   Zoster (Shingles) Vaccine (1 of 2) Never done   COVID-19 Vaccine (6 - 2024-25 season) 05/27/2023   Mammogram  07/11/2024*   Flu Shot  04/25/2024   Yearly kidney function blood test for diabetes  01/06/2025   Medicare Annual Wellness Visit  03/27/2025   DTaP/Tdap/Td vaccine (2 - Td or Tdap) 11/09/2029   Colon Cancer Screening  12/30/2029   DEXA scan (bone density measurement)  Completed   Hepatitis C Screening  Completed   Hepatitis B Vaccine  Aged Out   HPV Vaccine  Aged Out   Meningitis B Vaccine  Aged Out  *Topic was postponed. The date shown is not the original due date.    Advanced directives: (Declined) Advance directive discussed with you today. Even though you declined this today, please call our office should you change your mind, and  we can give you the proper paperwork for you to fill out. Advance Care Planning is important because it:  [x]  Makes sure you receive the medical care that is consistent with your values, goals, and preferences  [x]  It provides guidance to your family and loved ones and reduces their decisional burden about whether or not they are making the right decisions based on your wishes.  Follow the link provided in your after visit summary or read over the paperwork we have mailed to you to help you started getting your Advance Directives in place. If you need assistance in completing these, please reach out to us  so that we can help you!

## 2024-04-02 ENCOUNTER — Encounter (HOSPITAL_COMMUNITY): Payer: Self-pay

## 2024-04-03 ENCOUNTER — Ambulatory Visit
Admission: RE | Admit: 2024-04-03 | Discharge: 2024-04-03 | Disposition: A | Discharge status: Home / Self Care | Source: Ambulatory Visit | Attending: Student | Admitting: Student

## 2024-04-03 DIAGNOSIS — R072 Precordial pain: Secondary | ICD-10-CM | POA: Diagnosis not present

## 2024-04-03 MED ORDER — RUBIDIUM RB82 GENERATOR (RUBYFILL)
25.0000 | PACK | Freq: Once | INTRAVENOUS | Status: AC
  Administered 2024-04-03: 25.14 via INTRAVENOUS

## 2024-04-03 MED ORDER — REGADENOSON 0.4 MG/5ML IV SOLN
0.4000 mg | Freq: Once | INTRAVENOUS | Status: AC
  Administered 2024-04-03: 0.4 mg via INTRAVENOUS
  Filled 2024-04-03: qty 5

## 2024-04-03 MED ORDER — REGADENOSON 0.4 MG/5ML IV SOLN
INTRAVENOUS | Status: AC
  Filled 2024-04-03: qty 5

## 2024-04-03 MED ORDER — RUBIDIUM RB82 GENERATOR (RUBYFILL)
25.0000 | PACK | Freq: Once | INTRAVENOUS | Status: AC
Start: 2024-04-03 — End: 2024-04-03
  Administered 2024-04-03: 25.15 via INTRAVENOUS

## 2024-04-03 NOTE — Progress Notes (Signed)
 Patient presents for a cardiac PET stress test and tolerated procedure. She reported some shortness of breath for a few minutes and a headache. The shortness of breath resolved by the conclusion of the test. Patient maintained acceptable vital signs throughout the test and was offered caffeine after test.  Patient ambulated out of department with a steady gait.

## 2024-04-04 LAB — NM PET CT CARDIAC PERFUSION MULTI W/ABSOLUTE BLOODFLOW
MBFR: 2.17
Nuc Rest EF: 53 %
Nuc Stress EF: 61 %
Peak HR: 77 {beats}/min
Rest HR: 70 {beats}/min
Rest MBF: 0.81 ml/g/min
Rest Nuclear Isotope Dose: 25.2 mCi
SRS: 2
SSS: 1
ST Depression (mm): 0 mm
Stress MBF: 1.76 ml/g/min
Stress Nuclear Isotope Dose: 25.1 mCi
TID: 1.05

## 2024-04-05 ENCOUNTER — Ambulatory Visit: Payer: Self-pay | Admitting: Student

## 2024-04-07 NOTE — Progress Notes (Signed)
 Last read by Landry DELENA Fischer at 9:01AM on 04/07/2024.

## 2024-04-20 ENCOUNTER — Other Ambulatory Visit: Payer: Self-pay | Admitting: Family Medicine

## 2024-04-25 NOTE — Progress Notes (Signed)
 Cardiology Office Note  Date:  04/28/2024   ID:  Carmen Buchanan, DOB 1956/03/17, MRN [id number]  PCP:  Cleatus Arlyss RAMAN, MD   Chief Complaint  Patient presents with   Follow-up    F/U Cardiac Stress test    HPI:  Carmen Buchanan is a 68 year old woman with past medical history of Anemia  Obesity Coronary calcification LAD, RCA on stress test Ascending aorta dilation 4.7 cm CRI Who presents for follow-up of her paroxysmal tachycardia, coronary calcification  Last seen by myself in clinic August 2022 Seen by one of our providers June 2025 On that visit reported shortness of breath and chest tightness, palpitations  Echo May 2025 Ejection fraction 55 to 60%, normal RV size and function, ascending aorta 4.7 cm  PET stress test July 2025 No ischemia, low risk study Coronary calcifications present  Overall reports that she feels well Denies significant chest pain or palpitations  Nonsmoker No diabetes  No regular exercise program  EKG from June 2025 reviewed  Family history reviewed Father with CAD, MI mitral valve prolapse Mother with aortic valve fused, ascending aorta dilation   PMH:   has a past medical history of Anemia, Anxiety, Arthritis, CKD (chronic kidney disease) stage 3, GFR 30-59 ml/min (HCC), Complication of anesthesia, GERD (gastroesophageal reflux disease), Hemorrhoid, HTN (hypertension), IBS (irritable bowel syndrome), Migraine, unspecified, without mention of intractable migraine without mention of status migrainosus, Obesity, and Renal stones.  PSH:    Past Surgical History:  Procedure Laterality Date   ABLATION     uterus   CESAREAN SECTION     CYSTOSCOPY N/A 07/14/2013   Procedure: CYSTOSCOPY;  Surgeon: Ricardo Likens, MD;  Location: [facility];  Service: Urology;  Laterality: N/A;   GASTRIC BYPASS     sleeve procedure 06/2011 in [facility] Mini Bypass  2012   HEMORRHOID SURGERY  2013   LASER ABLATION CONDYLOMA CERVICAL / VULVAR  5/04    uncontrollable bleeding   LUMBAR DISC SURGERY  7/01   L4-5; Dr. Drury   NEPHROLITHOTOMY Right 07/14/2013   Procedure: NEPHROLITHOTOMY PERCUTANEOUS  Procedure: Right 1st stage Percutaneous Nephrostolithotomy with surgeon access and cystoscopy;  Surgeon: Ricardo Likens, MD;  Location: [facility];  Service: Urology;  Laterality: Right;   NEPHROLITHOTOMY Right 07/16/2013   Procedure: NEPHROLITHOTOMY PERCUTANEOUS SECOND LOOK, diagnostic ureteroscopy and stent placement on the right ;  Surgeon: Ricardo Likens, MD;  Location: [facility];  Service: Urology;  Laterality: Right;   TONSILLECTOMY AND ADENOIDECTOMY  1970    Current Outpatient Medications  Medication Sig Dispense Refill   allopurinol (ZYLOPRIM) 100 MG tablet Take 100 mg by mouth daily.     buPROPion  (WELLBUTRIN  SR) 150 MG 12 hr tablet TAKE 1 TABLET BY MOUTH TWICE A DAY 180 tablet 1   fluconazole  (DIFLUCAN ) 100 MG tablet TAKE 1 TABLET BY MOUTH EVERY 3 DAYS 5 tablet 0   FLUoxetine  (PROZAC ) 40 MG capsule TAKE 2 CAPSULES BY MOUTH EVERY DAY 180 capsule 1   loratadine  (CLARITIN ) 10 MG tablet Take 1 tablet (10 mg total) by mouth every other day as needed for itching.     LORazepam  (ATIVAN ) 0.5 MG tablet Take 1 tablet (0.5 mg total) by mouth daily as needed. 30 tablet 1   nystatin  (MYCOSTATIN /NYSTOP ) powder Apply 1 Application topically in the morning and at bedtime. Patient may need early refill due to quantity required 60 g 1   omeprazole  (PRILOSEC) 20 MG capsule TAKE 1 CAPSULE IN THE MORNING AND AT  BEDTIME. 180 capsule 3   polyethylene glycol (MIRALAX  / GLYCOLAX ) packet Take 17 g by mouth daily.     No current facility-administered medications for this visit.    Allergies:   Ciprofloxacin, Latex, Lisinopril, Morphine, and Nsaids   Social History:  The patient  reports that she has never smoked. She has never used smokeless tobacco. She reports that she does not currently use alcohol. She reports that she does not use drugs.   Family History:    family history includes Aortic aneurysm in her mother; Colon polyps in her father, mother, and another family member; Coronary artery disease in her father; Heart disease in her mother; Other in her mother; Ulcers in her mother.    Review of Systems: Review of Systems  Constitutional: Negative.   HENT: Negative.    Respiratory: Negative.    Cardiovascular:  Positive for palpitations.  Gastrointestinal: Negative.   Musculoskeletal: Negative.   Neurological: Negative.   Psychiatric/Behavioral: Negative.    All other systems reviewed and are negative.    PHYSICAL EXAM: VS:  BP 130/62 (BP Location: Left Arm, Patient Position: Sitting, Cuff Size: Large)   Pulse 71   Ht 5' 9 (1.753 m)   Wt 242 lb 12.8 oz (110.1 kg)   SpO2 96%   BMI 35.86 kg/m  , BMI Body mass index is 35.86 kg/m. Constitutional:  oriented to person, place, and time. No distress.  Obese HENT:  Head: Grossly normal Eyes:  no discharge. No scleral icterus.  Neck: No JVD, no carotid bruits  Cardiovascular: Regular rate and rhythm, no murmurs appreciated Pulmonary/Chest: Clear to auscultation bilaterally, no wheezes or rails Abdominal: Soft.  no distension.  no tenderness.  Musculoskeletal: Normal range of motion Neurological:  normal muscle tone. Coordination normal. No atrophy Skin: Skin warm and dry Psychiatric: normal affect, pleasant  Recent Labs: 01/07/2024: ALT 17; BUN 31; Creatinine, Ser 2.11; Hemoglobin 11.1; Platelets 416.0; Potassium 4.7; Sodium 139    Lipid Panel Lab Results  Component Value Date   CHOL 169 11/03/2019   HDL 62.90 11/03/2019   LDLCALC 71 11/03/2019   TRIG 174.0 (H) 11/03/2019      Wt Readings from Last 3 Encounters:  04/28/24 242 lb 12.8 oz (110.1 kg)  03/27/24 237 lb (107.5 kg)  03/18/24 240 lb 12.8 oz (109.2 kg)     ASSESSMENT AND PLAN:  Problem List Items Addressed This Visit   None Visit Diagnoses       Dilation of aorta (HCC)    -  Primary     Coronary artery  disease of native artery of native heart with stable angina pectoris (HCC)         Paroxysmal tachycardia (HCC)           Paroxysmal tachycardia Prior long-term monitor 2022 with rare episodes of tachycardia short-lived Not on beta-blocker  Obesity We have encouraged continued exercise, careful diet management in an effort to lose weight.  Coronary calcification Noted on CT images on stress testing No significant ischemia on stress test Lipid panel ordered  Essential hypertension Blood pressure is well controlled on today's visit. No changes made to the medications.  Hyperlipidemia No recent lipid panel, last was 4 years ago Fasting lipid panel ordered today Consider low-dose statin or Zetia  if numbers are elevated   Signed, Velinda Lunger, M.D., Ph.D. [facility] [place], [place] [phone]

## 2024-04-28 ENCOUNTER — Ambulatory Visit: Attending: Cardiovascular Disease | Admitting: Cardiovascular Disease

## 2024-04-28 ENCOUNTER — Encounter: Payer: Self-pay | Admitting: Cardiovascular Disease

## 2024-04-28 VITALS — BP 130/62 | HR 71 | Ht 69.0 in | Wt 242.8 lb

## 2024-04-28 DIAGNOSIS — I25118 Atherosclerotic heart disease of native coronary artery with other forms of angina pectoris: Secondary | ICD-10-CM | POA: Diagnosis not present

## 2024-04-28 DIAGNOSIS — Z79899 Other long term (current) drug therapy: Secondary | ICD-10-CM | POA: Diagnosis not present

## 2024-04-28 DIAGNOSIS — I77819 Aortic ectasia, unspecified site: Secondary | ICD-10-CM

## 2024-04-28 DIAGNOSIS — I479 Paroxysmal tachycardia, unspecified: Secondary | ICD-10-CM | POA: Diagnosis not present

## 2024-04-28 NOTE — Patient Instructions (Addendum)
 Medication Instructions:  No changes  If you need a refill on your cardiac medications before your next appointment, please call your pharmacy.   Lab work: Lipids , fasting  Testing/Procedures: No new testing needed  Follow-Up: At [facility], you and your health needs are our priority.  As part of our continuing mission to provide you with exceptional heart care, we have created designated Provider Care Teams.  These Care Teams include your primary Cardiologist (physician) and Advanced Practice Providers (APPs -  Physician Assistants and Nurse Practitioners) who all work together to provide you with the care you need, when you need it.  You will need a follow up appointment in 12 months  Providers on your designated Care Team:   Lonni Meager, NP Bernardino Bring, PA-C Cadence [place]ster, [place]  COVID-19 Vaccine Information can be found at: [email or web address] For questions related to vaccine distribution or appointments, please email [email or web address] or call [phone].

## 2024-04-29 ENCOUNTER — Ambulatory Visit: Payer: Self-pay | Admitting: Cardiovascular Disease

## 2024-04-29 LAB — LIPID PANEL
Chol/HDL Ratio: 2.7 ratio (ref 0.0–4.4)
Cholesterol, Total: 192 mg/dL (ref 100–199)
HDL: 72 mg/dL (ref >39)
LDL Chol Calc (NIH): 103 mg/dL — ABNORMAL HIGH (ref 0–99)
Triglycerides: 96 mg/dL (ref 0–149)
VLDL Cholesterol Cal: 17 mg/dL (ref 5–40)

## 2024-05-02 ENCOUNTER — Other Ambulatory Visit: Payer: Self-pay | Admitting: Emergency Medicine

## 2024-05-02 DIAGNOSIS — Z79899 Other long term (current) drug therapy: Secondary | ICD-10-CM

## 2024-05-02 MED ORDER — EZETIMIBE 10 MG PO TABS: 10.0000 mg | ORAL_TABLET | Freq: Every day | ORAL | 3 refills | Status: AC

## 2024-05-04 ENCOUNTER — Encounter: Payer: Self-pay | Admitting: Family Medicine

## 2024-05-04 ENCOUNTER — Other Ambulatory Visit: Payer: Self-pay | Admitting: Family Medicine

## 2024-05-05 NOTE — Telephone Encounter (Signed)
 LOV:  03/18/24 NOV: NOTHING SCHEDULED  Last Refill Fluconazole  (DIFLUCAN ) 100 MG tablet 04/21/2024  5 TABLETS 0 REFILLS FLUoxetine  (PROZAC ) 40 MG capsule  11/12/2023  180 CAPSULE 1 REFILL

## 2024-05-06 ENCOUNTER — Other Ambulatory Visit: Payer: Self-pay

## 2024-05-06 ENCOUNTER — Emergency Department (HOSPITAL_BASED_OUTPATIENT_CLINIC_OR_DEPARTMENT_OTHER)

## 2024-05-06 ENCOUNTER — Encounter (HOSPITAL_BASED_OUTPATIENT_CLINIC_OR_DEPARTMENT_OTHER): Payer: Self-pay

## 2024-05-06 ENCOUNTER — Ambulatory Visit (INDEPENDENT_AMBULATORY_CARE_PROVIDER_SITE_OTHER): Admitting: Family Medicine

## 2024-05-06 ENCOUNTER — Emergency Department (HOSPITAL_BASED_OUTPATIENT_CLINIC_OR_DEPARTMENT_OTHER)
Admission: EM | Admit: 2024-05-06 | Discharge: 2024-05-06 | Disposition: A | Discharge status: Home / Self Care | Attending: Emergency Medicine | Admitting: Emergency Medicine

## 2024-05-06 VITALS — BP 138/76 | HR 73 | Temp 100.3°F | Ht 69.0 in | Wt 244.0 lb

## 2024-05-06 DIAGNOSIS — R945 Abnormal results of liver function studies: Secondary | ICD-10-CM | POA: Insufficient documentation

## 2024-05-06 DIAGNOSIS — L089 Local infection of the skin and subcutaneous tissue, unspecified: Secondary | ICD-10-CM

## 2024-05-06 DIAGNOSIS — R110 Nausea: Secondary | ICD-10-CM | POA: Insufficient documentation

## 2024-05-06 DIAGNOSIS — R7989 Other specified abnormal findings of blood chemistry: Secondary | ICD-10-CM

## 2024-05-06 DIAGNOSIS — R109 Unspecified abdominal pain: Secondary | ICD-10-CM

## 2024-05-06 DIAGNOSIS — K828 Other specified diseases of gallbladder: Secondary | ICD-10-CM | POA: Diagnosis not present

## 2024-05-06 DIAGNOSIS — R1013 Epigastric pain: Secondary | ICD-10-CM | POA: Insufficient documentation

## 2024-05-06 DIAGNOSIS — N200 Calculus of kidney: Secondary | ICD-10-CM | POA: Diagnosis not present

## 2024-05-06 DIAGNOSIS — R1011 Right upper quadrant pain: Secondary | ICD-10-CM | POA: Diagnosis not present

## 2024-05-06 DIAGNOSIS — Z91040 Latex allergy status: Secondary | ICD-10-CM | POA: Insufficient documentation

## 2024-05-06 LAB — CBC
HCT: 32.8 % — ABNORMAL LOW (ref 36.0–46.0)
Hemoglobin: 10.1 g/dL — ABNORMAL LOW (ref 12.0–15.0)
MCH: 26.5 pg (ref 26.0–34.0)
MCHC: 30.8 g/dL (ref 30.0–36.0)
MCV: 86.1 fL (ref 80.0–100.0)
Platelets: 320 K/uL (ref 150–400)
RBC: 3.81 MIL/uL — ABNORMAL LOW (ref 3.87–5.11)
RDW: 15.4 % (ref 11.5–15.5)
WBC: 12.9 K/uL — ABNORMAL HIGH (ref 4.0–10.5)
nRBC: 0 % (ref 0.0–0.2)

## 2024-05-06 LAB — URINALYSIS, ROUTINE W REFLEX MICROSCOPIC
Bacteria, UA: NONE SEEN
Bilirubin Urine: NEGATIVE
Glucose, UA: NEGATIVE mg/dL
Ketones, ur: NEGATIVE mg/dL
Leukocytes,Ua: NEGATIVE
Nitrite: NEGATIVE
Protein, ur: 30 mg/dL — AB
Specific Gravity, Urine: 1.025 (ref 1.005–1.030)
pH: 5.5 (ref 5.0–8.0)

## 2024-05-06 LAB — COMPREHENSIVE METABOLIC PANEL WITH GFR
ALT: 129 U/L — ABNORMAL HIGH (ref 0–44)
AST: 330 U/L — ABNORMAL HIGH (ref 15–41)
Albumin: 3.7 g/dL (ref 3.5–5.0)
Alkaline Phosphatase: 230 U/L — ABNORMAL HIGH (ref 38–126)
Anion gap: 15 (ref 5–15)
BUN: 34 mg/dL — ABNORMAL HIGH (ref 8–23)
CO2: 18 mmol/L — ABNORMAL LOW (ref 22–32)
Calcium: 9 mg/dL (ref 8.9–10.3)
Chloride: 106 mmol/L (ref 98–111)
Creatinine, Ser: 2.59 mg/dL — ABNORMAL HIGH (ref 0.44–1.00)
GFR, Estimated: 20 mL/min — ABNORMAL LOW (ref >60)
Glucose, Bld: 146 mg/dL — ABNORMAL HIGH (ref 70–99)
Potassium: 4.5 mmol/L (ref 3.5–5.1)
Sodium: 139 mmol/L (ref 135–145)
Total Bilirubin: 0.8 mg/dL (ref 0.0–1.2)
Total Protein: 7.2 g/dL (ref 6.5–8.1)

## 2024-05-06 LAB — HEPATITIS PANEL, ACUTE
HCV Ab: NONREACTIVE
Hep A IgM: NONREACTIVE
Hep B C IgM: NONREACTIVE
Hepatitis B Surface Ag: NONREACTIVE

## 2024-05-06 LAB — LIPASE, BLOOD: Lipase: 27 U/L (ref 11–51)

## 2024-05-06 MED ORDER — HYDROMORPHONE HCL 1 MG/ML IJ SOLN
1.0000 mg | Freq: Once | INTRAMUSCULAR | Status: AC
  Administered 2024-05-06 (×2): 1 mg via INTRAVENOUS
  Filled 2024-05-06: qty 1

## 2024-05-06 MED ORDER — SODIUM CHLORIDE 0.9 % IV BOLUS
1000.0000 mL | Freq: Once | INTRAVENOUS | Status: AC
  Administered 2024-05-06 (×2): 1000 mL via INTRAVENOUS

## 2024-05-06 MED ORDER — NYSTATIN 100000 UNIT/GM EX POWD: 1.0000 | Freq: Two times a day (BID) | CUTANEOUS | 1 refills | Status: AC

## 2024-05-06 MED ORDER — ONDANSETRON HCL 4 MG PO TABS: 4.0000 mg | ORAL_TABLET | Freq: Three times a day (TID) | ORAL | 0 refills | Status: AC | PRN

## 2024-05-06 MED ORDER — OXYCODONE-ACETAMINOPHEN 5-325 MG PO TABS
1.0000 | ORAL_TABLET | Freq: Once | ORAL | Status: AC
  Administered 2024-05-06 (×2): 1 via ORAL
  Filled 2024-05-06: qty 1

## 2024-05-06 MED ORDER — ONDANSETRON HCL 4 MG/2ML IJ SOLN
4.0000 mg | Freq: Once | INTRAMUSCULAR | Status: AC
  Administered 2024-05-06 (×2): 4 mg via INTRAVENOUS
  Filled 2024-05-06: qty 2

## 2024-05-06 MED ORDER — OXYCODONE-ACETAMINOPHEN 5-325 MG PO TABS: 1.0000 | ORAL_TABLET | Freq: Four times a day (QID) | ORAL | 0 refills | Status: DC | PRN

## 2024-05-06 MED ORDER — DOXYCYCLINE HYCLATE 100 MG PO TABS: 100.0000 mg | ORAL_TABLET | Freq: Two times a day (BID) | ORAL | 0 refills | Status: DC

## 2024-05-06 MED ORDER — FLUCONAZOLE 100 MG PO TABS: 100.0000 mg | ORAL_TABLET | Freq: Every day | ORAL | 0 refills | Status: DC

## 2024-05-06 NOTE — ED Triage Notes (Signed)
 Pt presents via POV c/o abd pain since last pm at 2300. Reports some nausea.

## 2024-05-06 NOTE — Patient Instructions (Signed)
 Zofran  for nausea.  Doxy/diflucan /nystatin  in the meantime.  Recheck LFTs next week.  Take care.  Glad to see you. Update me as needed.

## 2024-05-06 NOTE — Telephone Encounter (Signed)
 Closing encounter, patient is in office today from ER visit

## 2024-05-06 NOTE — Telephone Encounter (Signed)
 Please defer calling patient.  Currently in ER.  See mychart message.

## 2024-05-06 NOTE — Telephone Encounter (Signed)
 Both rxs sent.  Please get update on rash/diflucan  use.  Thanks.

## 2024-05-06 NOTE — Progress Notes (Signed)
 She started with abd pain last night, went to ER after no relief with TUMS.  Had CT then u/s.  Normal right upper quadrant ultrasound.  Was able to be discharged home.  She had central abd pain that radiated around B to the back, R>L.  No pain as severe as this in the past. Abd pain is better at OV.   Abd wall/skin changes, worse over the weekend. Temp 100.3 here at OV.  History abdominal wall fungal/bacterial infection.  Had improved with treatment previously.  Worse recently.  First noted temperature was today at office visit.  Still with some nausea and occasional dry heaving.  LFT elevation and emergency room discussed with patient.  Hepatitis panel pending.  Meds, vitals, and allergies reviewed.   ROS: Per HPI unless specifically indicated in ROS section   Not in respiratory distress but she looks like she does not feel well. Ncat MMM Neck supple, no LA Rrr Ctab Abd soft  Chaperoned exam with right sided greater than left superficial infection.  She has locally denuded skin.  The concern is for superficial infection that is fungal with concurrent bacterial superinfection.  No fluctuant mass. Intact bowel sounds. R lower inguinal area with fungal infection, similar to prior.  Chaperoned exam.   30 minutes were devoted to patient care in this encounter (this includes time spent reviewing the patient's file/history, interviewing and examining the patient, counseling/reviewing plan with patient).

## 2024-05-06 NOTE — Discharge Instructions (Addendum)
 The CT scan did not show any acute findings in your abdomen or pelvis.    There is an incidental kidney stone in your right kidney but the stone is not in the ureter where it would cause pain or discomfort.  You do have an incidental fibroid in the uterus but there does not appear to be any inflammation and  this is also unlikely to be the source of your pain.  The CT scan did show some mild increased caliber of your jejunum beyond the site of your surgery.  This is similar to previous CAT scans.  Your liver tests were mildly elevated but the ultrasound did not show any signs of gallstones or other acute abnormality.  Follow-up with your primary care doctor and or a GI doctor for further evaluation as we discussed.

## 2024-05-06 NOTE — ED Provider Notes (Addendum)
 Patient was initially seen by Dr. Geroldine.  Please see his note.  Patient presented to the ED for evaluation of abdominal pain that started last evening.   Patient's labs were notable for elevated white blood cell count and increased liver function tests.  CT scan shows the following findings.  ImPRESSION: 1. No acute findings in the abdomen or pelvis. 2. Mild increased caliber of the proximal jejunum just beyond gastrojejunal anastomosis, similar to the prior exam from 02/28/2022. In a patient with a history of prior gastric bypass surgery, if there is a clinical concern for acute bowel pathology, CT abdomen and pelvis with both iv and oral contrast material is recommended. 3. Small stone within the inferior pole of the right kidney measuring 3 mm. No hydronephrosis or mass identified. 4. Exophytic calcified fibroid arising off the right posterior uterine fundus measuring 4.6 cm. No adnexal mass.   Patient has elevated creatinine.  IV contrast study would not be recommended.  Patient does have mild increased caliber of the proximal jejunum.  Pt still with ttp in abd,  TTP RUQ.  Concerned about possible cholecystitis.  Will arrange for transfer so pt can receive a RUQ us .  US  not available at this facility until the afternoon. 7.5 hours from now.  Dr Dasie aware of pt transfer   Randol Simmonds, MD 05/06/24 [phone]  Notified we do have US  here.  Will proceed with US .  Ultrasound does not show any abnormality.  Patient has normal liver findings.  No signs of gallstones.  Unclear etiology of her symptoms.  Will send off hepatitis panel.  Follow up with PCP or GI   Randol Simmonds, MD 05/06/24 [phone]

## 2024-05-07 ENCOUNTER — Other Ambulatory Visit: Payer: Self-pay | Admitting: Family Medicine

## 2024-05-07 DIAGNOSIS — L089 Local infection of the skin and subcutaneous tissue, unspecified: Secondary | ICD-10-CM | POA: Insufficient documentation

## 2024-05-07 DIAGNOSIS — R7989 Other specified abnormal findings of blood chemistry: Secondary | ICD-10-CM | POA: Insufficient documentation

## 2024-05-07 NOTE — Assessment & Plan Note (Signed)
 Of unclear source.  Unclear if she already passed a solitary gallstone.  Not jaundiced.  At this point still okay for outpatient follow-up.  Plan on rechecking LFTs at office visit next week.

## 2024-05-07 NOTE — Assessment & Plan Note (Addendum)
 Unclear if fever is related to abdominal wall changes. Start Doxy/diflucan /nystatin  in the meantime.  Routine cautions given to patient.  She agrees with plan.

## 2024-05-07 NOTE — Assessment & Plan Note (Signed)
 Unclear source.  Unclear if she already passed a gallstone.  Belly pain is better at time of office visit.  Can use Zofran  for nausea as needed.  Update me as needed.

## 2024-05-12 ENCOUNTER — Ambulatory Visit: Admitting: Family Medicine

## 2024-05-13 ENCOUNTER — Ambulatory Visit: Admitting: Family Medicine

## 2024-05-13 NOTE — ED Provider Notes (Signed)
 [facility]PART[facility] AT [facility] Provider Note   CSN: [id number] Arrival date & time: 05/06/24  9772     Patient presents with: Abdominal Pain   Carmen Buchanan is a 68 y.o. female.   Patient is a 68 year old female presenting with abdominal pain starting last night at approximately 11:00.  She has been nauseated, but has not vomited.  No fevers or chills.  No aggravating or alleviating factors.       Prior to Admission medications   Medication Sig Start Date End Date Taking? Authorizing Provider  oxyCODONE -acetaminophen  (PERCOCET/ROXICET) 5-325 MG tablet Take 1 tablet by mouth every 6 (six) hours as needed for severe pain (pain score 7-10). 05/06/24  Yes Randol Simmonds, MD  allopurinol (ZYLOPRIM) 100 MG tablet Take 100 mg by mouth daily. 11/26/23 11/25/24  [provider]  buPROPion  (WELLBUTRIN  SR) 150 MG 12 hr tablet TAKE 1 TABLET BY MOUTH TWICE A DAY 05/07/24   Cleatus Arlyss RAMAN, MD  doxycycline  (VIBRA -TABS) 100 MG tablet Take 1 tablet (100 mg total) by mouth 2 (two) times daily. 05/06/24   Cleatus Arlyss RAMAN, MD  ezetimibe  (ZETIA ) 10 MG tablet Take 1 tablet (10 mg total) by mouth daily. 05/02/24 04/27/25  Gollan, Timothy J, MD  fluconazole  (DIFLUCAN ) 100 MG tablet Take 1 tablet (100 mg total) by mouth daily. Then change to every 3 days when improving. 05/06/24   Cleatus Arlyss RAMAN, MD  FLUoxetine  (PROZAC ) 40 MG capsule TAKE 2 CAPSULES BY MOUTH EVERY DAY 05/06/24   Cleatus Arlyss RAMAN, MD  loratadine  (CLARITIN ) 10 MG tablet Take 1 tablet (10 mg total) by mouth every other day as needed for itching. 03/23/24   Cleatus Arlyss RAMAN, MD  LORazepam  (ATIVAN ) 0.5 MG tablet Take 1 tablet (0.5 mg total) by mouth daily as needed. 08/07/22   Cleatus Arlyss RAMAN, MD  nystatin  (MYCOSTATIN /NYSTOP ) powder Apply 1 Application topically in the morning and at bedtime. Patient may need early refill due to quantity required 05/06/24   Cleatus Arlyss RAMAN, MD  omeprazole  (PRILOSEC) 20 MG capsule TAKE 1  CAPSULE IN THE MORNING AND AT BEDTIME. 09/03/23   Cleatus Arlyss RAMAN, MD  ondansetron  (ZOFRAN ) 4 MG tablet Take 1 tablet (4 mg total) by mouth every 8 (eight) hours as needed for nausea or vomiting. 05/06/24   Cleatus Arlyss RAMAN, MD  polyethylene glycol (MIRALAX  / GLYCOLAX ) packet Take 17 g by mouth daily.    [provider]    Allergies: Ciprofloxacin, Latex, Lisinopril, Morphine, and Nsaids    Review of Systems  All other systems reviewed and are negative.   Updated Vital Signs BP 137/73   Pulse 86   Temp 98.7 F (37.1 C) (Axillary)   Resp 18   Ht 5' 9 (1.753 m)   Wt 108.9 kg   SpO2 96%   BMI 35.44 kg/m   Physical Exam Vitals and nursing note reviewed.  Constitutional:      General: She is not in acute distress.    Appearance: She is well-developed. She is not diaphoretic.  HENT:     Head: Normocephalic and atraumatic.  Cardiovascular:     Rate and Rhythm: Normal rate and regular rhythm.     Heart sounds: No murmur heard.    No friction rub. No gallop.  Pulmonary:     Effort: Pulmonary effort is normal. No respiratory distress.     Breath sounds: Normal breath sounds. No wheezing.  Abdominal:     General: Bowel sounds are normal. There  is no distension.     Palpations: Abdomen is soft.     Tenderness: There is abdominal tenderness in the epigastric area. There is no right CVA tenderness, left CVA tenderness, guarding or rebound.  Musculoskeletal:        General: Normal range of motion.     Cervical back: Normal range of motion and neck supple.  Skin:    General: Skin is warm and dry.  Neurological:     General: No focal deficit present.     Mental Status: She is alert and oriented to person, place, and time.     (all labs ordered are listed, but only abnormal results are displayed) Labs Reviewed  COMPREHENSIVE METABOLIC PANEL WITH GFR - Abnormal; Notable for the following components:      Result Value   CO2 18 (*)    Glucose, Bld 146 (*)    BUN 34 (*)     Creatinine, Ser 2.59 (*)    AST 330 (*)    ALT 129 (*)    Alkaline Phosphatase 230 (*)    GFR, Estimated 20 (*)    All other components within normal limits  CBC - Abnormal; Notable for the following components:   WBC 12.9 (*)    RBC 3.81 (*)    Hemoglobin 10.1 (*)    HCT 32.8 (*)    All other components within normal limits  URINALYSIS, ROUTINE W REFLEX MICROSCOPIC - Abnormal; Notable for the following components:   APPearance CLEAR (*)    Hgb urine dipstick TRACE (*)    Protein, ur 30 (*)    All other components within normal limits  LIPASE, BLOOD  HEPATITIS PANEL, ACUTE    EKG: None  Radiology: No results found.   Procedures   Medications Ordered in the ED  ondansetron  (ZOFRAN ) injection 4 mg (4 mg Intravenous Given 05/06/24 0414)  sodium chloride  0.9 % bolus 1,000 mL (0 mLs Intravenous Stopped 05/06/24 0518)  HYDROmorphone  (DILAUDID ) injection 1 mg (1 mg Intravenous Given 05/06/24 0414)  oxyCODONE -acetaminophen  (PERCOCET/ROXICET) 5-325 MG per tablet 1 tablet (1 tablet Oral Given 05/06/24 0945)                                    Medical Decision Making Amount and/or Complexity of Data Reviewed Labs: ordered. Radiology: ordered.  Risk Prescription drug management.   Patient presenting with abdominal pain as described in the HPI.  She arrives here with stable vital signs and is afebrile.  There is tenderness in the epigastric region.  Laboratory studies obtained including CBC, CMP, and lipase.  Patient has elevations of her transaminases and alk phos, but normal bilirubin.  White count is 12.9 with creatinine of 2.6.  CT scan of the abdomen and pelvis has been ordered and is currently pending.  Care signed out to oncoming provider at shift change to obtain the results of the study and determine final disposition.     Final diagnoses:  Abdominal pain, unspecified abdominal location  Elevated LFTs    ED Discharge Orders          Ordered     oxyCODONE -acetaminophen  (PERCOCET/ROXICET) 5-325 MG tablet  Every 6 hours PRN        05/06/24 0930               Geroldine Berg, MD 05/13/24 [phone]

## 2024-05-19 ENCOUNTER — Encounter: Payer: Self-pay | Admitting: Family Medicine

## 2024-05-23 ENCOUNTER — Ambulatory Visit: Admitting: Family Medicine

## 2024-05-23 VITALS — BP 140/76 | HR 70 | Temp 98.2°F | Ht 69.0 in | Wt 235.6 lb

## 2024-05-23 DIAGNOSIS — D649 Anemia, unspecified: Secondary | ICD-10-CM | POA: Diagnosis not present

## 2024-05-23 DIAGNOSIS — D631 Anemia in chronic kidney disease: Secondary | ICD-10-CM

## 2024-05-23 DIAGNOSIS — L089 Local infection of the skin and subcutaneous tissue, unspecified: Secondary | ICD-10-CM | POA: Diagnosis not present

## 2024-05-23 DIAGNOSIS — N189 Chronic kidney disease, unspecified: Secondary | ICD-10-CM

## 2024-05-23 DIAGNOSIS — E785 Hyperlipidemia, unspecified: Secondary | ICD-10-CM | POA: Diagnosis not present

## 2024-05-23 DIAGNOSIS — R7989 Other specified abnormal findings of blood chemistry: Secondary | ICD-10-CM

## 2024-05-23 LAB — URINALYSIS, ROUTINE W REFLEX MICROSCOPIC
Bilirubin Urine: NEGATIVE
Hgb urine dipstick: NEGATIVE
Ketones, ur: NEGATIVE
Leukocytes,Ua: NEGATIVE
Nitrite: NEGATIVE
Specific Gravity, Urine: 1.02 (ref 1.000–1.030)
Total Protein, Urine: NEGATIVE
Urine Glucose: NEGATIVE
Urobilinogen, UA: 0.2 (ref 0.0–1.0)
pH: 5.5 (ref 5.0–8.0)

## 2024-05-23 LAB — CBC WITH DIFFERENTIAL/PLATELET
Basophils Absolute: 0 K/uL (ref 0.0–0.1)
Basophils Relative: 0.3 % (ref 0.0–3.0)
Eosinophils Absolute: 0.2 K/uL (ref 0.0–0.7)
Eosinophils Relative: 2 % (ref 0.0–5.0)
HCT: 32.8 % — ABNORMAL LOW (ref 36.0–46.0)
Hemoglobin: 10.1 g/dL — ABNORMAL LOW (ref 12.0–15.0)
Lymphocytes Relative: 19.7 % (ref 12.0–46.0)
Lymphs Abs: 1.5 K/uL (ref 0.7–4.0)
MCHC: 30.7 g/dL (ref 30.0–36.0)
MCV: 83.3 fl (ref 78.0–100.0)
Monocytes Absolute: 0.7 K/uL (ref 0.1–1.0)
Monocytes Relative: 8.7 % (ref 3.0–12.0)
Neutro Abs: 5.4 K/uL (ref 1.4–7.7)
Neutrophils Relative %: 69.3 % (ref 43.0–77.0)
Platelets: 456 K/uL — ABNORMAL HIGH (ref 150.0–400.0)
RBC: 3.94 Mil/uL (ref 3.87–5.11)
RDW: 16.1 % — ABNORMAL HIGH (ref 11.5–15.5)
WBC: 7.8 K/uL (ref 4.0–10.5)

## 2024-05-23 LAB — LIPID PANEL
Cholesterol: 178 mg/dL (ref 0–200)
HDL: 74.2 mg/dL (ref >39.00)
LDL Cholesterol: 81 mg/dL (ref 0–99)
NonHDL: 103.33
Total CHOL/HDL Ratio: 2
Triglycerides: 110 mg/dL (ref 0.0–149.0)
VLDL: 22 mg/dL (ref 0.0–40.0)

## 2024-05-23 LAB — VITAMIN B12: Vitamin B-12: 151 pg/mL — ABNORMAL LOW (ref 211–911)

## 2024-05-23 LAB — FERRITIN: Ferritin: 10.2 ng/mL (ref 10.0–291.0)

## 2024-05-23 LAB — FOLATE: Folate: >22.9 ng/mL (ref >5.9)

## 2024-05-23 MED ORDER — FLUCONAZOLE 100 MG PO TABS: ORAL_TABLET | ORAL | 0 refills | Status: DC

## 2024-05-23 NOTE — Progress Notes (Signed)
 She has been off diflucan  this week.  Her skin is better in the meantime and she is feeling better.  Still using nystatin .  No FCNAVD.  She has some mild itching at the prev infection site on the R lower abd/groin.   Lower HGB noted.  D/w pt about follow up labs.  She didn't tolerate oral iron prev.  She had prev iron infusions.  H/o gastric bypass noted.   H/o LFT elevation, recheck pending.  No more abd pain.   D/w pt about recheck lipids, now on zetia .  Discussed that I would update Dr. Gollan about that. No ADE on med.   Meds, vitals, and allergies reviewed.   ROS: Per HPI unless specifically indicated in ROS section   Nad Ncat Neck supple, no LA Rrr Ctab Abd soft, not ttp Chaperoned exam with postinfectious/postinflammatory hyperpigmentation noted inferior to the pannus, especially on the right lower abdomen.  No fluctuant mass or sign of active infection.  Site of previous inflammation/infection is well-demarcated with chronic changes. Skin well-perfused.

## 2024-05-23 NOTE — Patient Instructions (Signed)
 Try using diflucan  weekly, up to twice weekly if needed.  Use nystatin  if needed.  Go to the lab on the way out.   If you have mychart we'll likely use that to update you.    Update me as needed.  Take care.  Glad to see you.

## 2024-05-26 ENCOUNTER — Other Ambulatory Visit: Payer: Self-pay | Admitting: Family Medicine

## 2024-05-26 ENCOUNTER — Ambulatory Visit: Payer: Self-pay | Admitting: Family Medicine

## 2024-05-26 DIAGNOSIS — E785 Hyperlipidemia, unspecified: Secondary | ICD-10-CM | POA: Insufficient documentation

## 2024-05-26 DIAGNOSIS — E538 Deficiency of other specified B group vitamins: Secondary | ICD-10-CM | POA: Insufficient documentation

## 2024-05-26 MED ORDER — CYANOCOBALAMIN 1000 MCG/ML IJ SOLN: INTRAMUSCULAR | Status: DC

## 2024-05-26 NOTE — Assessment & Plan Note (Signed)
 D/w pt about recheck lipids, now on zetia .  Discussed that I would update Dr. Gollan about that. No ADE on med.

## 2024-05-26 NOTE — Assessment & Plan Note (Signed)
 Improved, she had recurrent infections during the summer.  I suspect she had a superficial fungal infection that became superinfected with bacteria.  Discussed options.  Continue nystatin  powder.  She can try using diflucan  weekly, up to twice weekly if needed.  She can gradually taper use and extend the duration between doses if she is doing well.  Update me as needed.

## 2024-05-26 NOTE — Assessment & Plan Note (Signed)
 H/o LFT elevation, recheck pending.  No more abd pain.

## 2024-05-26 NOTE — Assessment & Plan Note (Signed)
 See notes on follow-up labs. She didn't tolerate oral iron prev.  She had prev iron infusions.  H/o gastric bypass noted.

## 2024-05-28 ENCOUNTER — Telehealth: Payer: Self-pay | Admitting: Family Medicine

## 2024-05-28 ENCOUNTER — Encounter: Payer: Self-pay | Admitting: Family Medicine

## 2024-05-28 NOTE — Telephone Encounter (Signed)
 I need this patient to be set up for B12 injections here in the clinic at nurse visits.  When she comes in for the first B12 shot, I need her to have a concurrent lab visit so she can have her future CMET collected.  Please see the MyChart message about that.  When you are setting up the B12 injections, please ask if she wants to have the iron infusion done in [place] or [place].  My understanding is that we can send her directly for the iron infusion in [place] but to get it done in [place] it would need to come through hematology.  Either way is fine with me.  Please let me know about her preference.  Thanks.

## 2024-05-29 ENCOUNTER — Other Ambulatory Visit

## 2024-05-29 ENCOUNTER — Other Ambulatory Visit: Payer: Self-pay | Admitting: Radiology

## 2024-05-29 ENCOUNTER — Ambulatory Visit

## 2024-05-29 DIAGNOSIS — E538 Deficiency of other specified B group vitamins: Secondary | ICD-10-CM | POA: Diagnosis not present

## 2024-05-29 DIAGNOSIS — D649 Anemia, unspecified: Secondary | ICD-10-CM

## 2024-05-29 MED ORDER — CYANOCOBALAMIN 1000 MCG/ML IJ SOLN
1000.0000 ug | Freq: Once | INTRAMUSCULAR | Status: AC
  Administered 2024-05-29: 1000 ug via INTRAMUSCULAR

## 2024-05-29 NOTE — Progress Notes (Signed)
 Per orders of Dr. Richrd Char, injection of B-12 given by Kris Pester in left deltoid. Patient tolerated injection well. Patient will make appointment for 1 week.

## 2024-05-29 NOTE — Telephone Encounter (Signed)
 JT- Please talk to me about setting up the iron infusion in [place].  Thanks.

## 2024-05-29 NOTE — Telephone Encounter (Signed)
 The patient is scheduled for weekly B12 injections. But I am not showing an active order request for CMET. I see the fecal occult test. But nothing for CMET. Please advise.

## 2024-05-29 NOTE — Telephone Encounter (Signed)
 Spoke with patient and she is aware that she will need to have labs drawn at her next B12 injection visit. Also she is agreeable to the iron infusion to be done in [place].

## 2024-05-30 ENCOUNTER — Other Ambulatory Visit: Payer: Self-pay | Admitting: Family Medicine

## 2024-05-30 DIAGNOSIS — D649 Anemia, unspecified: Secondary | ICD-10-CM

## 2024-05-30 DIAGNOSIS — D631 Anemia in chronic kidney disease: Secondary | ICD-10-CM

## 2024-05-30 LAB — FECAL OCCULT BLOOD, IMMUNOCHEMICAL: Fecal Occult Bld: POSITIVE — AB

## 2024-05-30 NOTE — Telephone Encounter (Signed)
 Orders have been placed. Need to call Monday and follow up. Call [phone].

## 2024-06-01 ENCOUNTER — Other Ambulatory Visit: Payer: Self-pay | Admitting: Family Medicine

## 2024-06-01 DIAGNOSIS — R195 Other fecal abnormalities: Secondary | ICD-10-CM

## 2024-06-01 NOTE — Telephone Encounter (Signed)
 Please talk to me about this Monday.  Many thanks.

## 2024-06-02 ENCOUNTER — Encounter: Payer: Self-pay | Admitting: Cardiovascular Disease

## 2024-06-03 ENCOUNTER — Encounter: Payer: Self-pay | Admitting: Gastroenterology

## 2024-06-04 ENCOUNTER — Other Ambulatory Visit: Payer: Self-pay | Admitting: Family Medicine

## 2024-06-04 ENCOUNTER — Encounter: Payer: Self-pay | Admitting: Family Medicine

## 2024-06-04 DIAGNOSIS — D649 Anemia, unspecified: Secondary | ICD-10-CM

## 2024-06-04 NOTE — Telephone Encounter (Signed)
 Talked with Dr. Cleatus. Have called an left message with office to make sure no further information needed.

## 2024-06-05 ENCOUNTER — Other Ambulatory Visit (INDEPENDENT_AMBULATORY_CARE_PROVIDER_SITE_OTHER)

## 2024-06-05 ENCOUNTER — Ambulatory Visit (INDEPENDENT_AMBULATORY_CARE_PROVIDER_SITE_OTHER)

## 2024-06-05 DIAGNOSIS — R7989 Other specified abnormal findings of blood chemistry: Secondary | ICD-10-CM

## 2024-06-05 DIAGNOSIS — D649 Anemia, unspecified: Secondary | ICD-10-CM

## 2024-06-05 LAB — COMPREHENSIVE METABOLIC PANEL WITH GFR
ALT: 10 U/L (ref 0–35)
AST: 16 U/L (ref 0–37)
Albumin: 3.9 g/dL (ref 3.5–5.2)
Alkaline Phosphatase: 125 U/L — ABNORMAL HIGH (ref 39–117)
BUN: 33 mg/dL — ABNORMAL HIGH (ref 6–23)
CO2: 25 meq/L (ref 19–32)
Calcium: 9 mg/dL (ref 8.4–10.5)
Chloride: 104 meq/L (ref 96–112)
Creatinine, Ser: 2.67 mg/dL — ABNORMAL HIGH (ref 0.40–1.20)
GFR: 18 mL/min — ABNORMAL LOW (ref >60.00)
Glucose, Bld: 94 mg/dL (ref 70–99)
Potassium: 4.9 meq/L (ref 3.5–5.1)
Sodium: 138 meq/L (ref 135–145)
Total Bilirubin: 0.4 mg/dL (ref 0.2–1.2)
Total Protein: 6.8 g/dL (ref 6.0–8.3)

## 2024-06-05 LAB — CBC WITH DIFFERENTIAL/PLATELET
Basophils Absolute: 0 K/uL (ref 0.0–0.1)
Basophils Relative: 0.5 % (ref 0.0–3.0)
Eosinophils Absolute: 0.1 K/uL (ref 0.0–0.7)
Eosinophils Relative: 2.1 % (ref 0.0–5.0)
HCT: 30.5 % — ABNORMAL LOW (ref 36.0–46.0)
Hemoglobin: 9.8 g/dL — ABNORMAL LOW (ref 12.0–15.0)
Lymphocytes Relative: 19.3 % (ref 12.0–46.0)
Lymphs Abs: 1.3 K/uL (ref 0.7–4.0)
MCHC: 32.2 g/dL (ref 30.0–36.0)
MCV: 83.5 fl (ref 78.0–100.0)
Monocytes Absolute: 0.4 K/uL (ref 0.1–1.0)
Monocytes Relative: 6.5 % (ref 3.0–12.0)
Neutro Abs: 4.7 K/uL (ref 1.4–7.7)
Neutrophils Relative %: 71.6 % (ref 43.0–77.0)
Platelets: 311 K/uL (ref 150.0–400.0)
RBC: 3.66 Mil/uL — ABNORMAL LOW (ref 3.87–5.11)
RDW: 16.9 % — ABNORMAL HIGH (ref 11.5–15.5)
WBC: 6.6 K/uL (ref 4.0–10.5)

## 2024-06-05 MED ORDER — CYANOCOBALAMIN 1000 MCG/ML IJ SOLN
1000.0000 ug | Freq: Once | INTRAMUSCULAR | Status: AC
  Administered 2024-06-05: 1000 ug via INTRAMUSCULAR

## 2024-06-05 NOTE — Progress Notes (Signed)
 Per orders of Dr. Arlyss Solian, injection of B-12 given by Danna CINDERELLA Hummer in right deltoid. Patient tolerated injection well. Patient will make appointment for 1 week.

## 2024-06-06 ENCOUNTER — Ambulatory Visit: Admitting: Gastroenterology

## 2024-06-06 ENCOUNTER — Encounter: Payer: Self-pay | Admitting: Family Medicine

## 2024-06-06 ENCOUNTER — Encounter: Payer: Self-pay | Admitting: Gastroenterology

## 2024-06-06 VITALS — BP 116/66 | HR 80 | Ht 67.0 in | Wt 237.4 lb

## 2024-06-06 DIAGNOSIS — K219 Gastro-esophageal reflux disease without esophagitis: Secondary | ICD-10-CM

## 2024-06-06 DIAGNOSIS — R195 Other fecal abnormalities: Secondary | ICD-10-CM | POA: Diagnosis not present

## 2024-06-06 DIAGNOSIS — R7989 Other specified abnormal findings of blood chemistry: Secondary | ICD-10-CM | POA: Diagnosis not present

## 2024-06-06 DIAGNOSIS — R1013 Epigastric pain: Secondary | ICD-10-CM | POA: Diagnosis not present

## 2024-06-06 DIAGNOSIS — Z862 Personal history of diseases of the blood and blood-forming organs and certain disorders involving the immune mechanism: Secondary | ICD-10-CM

## 2024-06-06 MED ORDER — NA SULFATE-K SULFATE-MG SULF 17.5-3.13-1.6 GM/177ML PO SOLN: 1.0000 | Freq: Once | ORAL | 0 refills | Status: AC

## 2024-06-06 NOTE — Patient Instructions (Signed)
 You have been scheduled for an endoscopy and colonoscopy. Please follow the written instructions given to you at your visit today.  If you use inhalers (even only as needed), please bring them with you on the day of your procedure.  DO NOT TAKE 7 DAYS PRIOR TO TEST- Trulicity (dulaglutide) Ozempic, Wegovy (semaglutide) Mounjaro (tirzepatide) Bydureon Bcise (exanatide extended release)  DO NOT TAKE 1 DAY PRIOR TO YOUR TEST Rybelsus (semaglutide) Adlyxin (lixisenatide) Victoza (liraglutide) Byetta (exanatide) ________________________________________________________________________  _______________________________________________________  If your blood pressure at your visit was 140/90 or greater, please contact your primary care physician to follow up on this.  _______________________________________________________  If you are age 30 or older, your body mass index should be between 23-30. Your Body mass index is 37.18 kg/m. If this is out of the aforementioned range listed, please consider follow up with your Primary Care Provider.  If you are age 61 or younger, your body mass index should be between 19-25. Your Body mass index is 37.18 kg/m. If this is out of the aformentioned range listed, please consider follow up with your Primary Care Provider.   ________________________________________________________  The [facility] providers would like to encourage you to use MYCHART to communicate with providers for non-urgent requests or questions.  Due to long hold times on the telephone, sending your provider a message by [facility] may be a faster and more efficient way to get a response.  Please allow 48 business hours for a response.  Please remember that this is for non-urgent requests.  _______________________________________________________  [facility] is using a team-based approach to care.  Your team is made up of your doctor and two to three APPS. Our APPS (Nurse  Practitioners and Physician Assistants) work with your physician to ensure care continuity for you. They are fully qualified to address your health concerns and develop a treatment plan. They communicate directly with your gastroenterologist to care for you. Seeing the Advanced Practice Practitioners on your physician's team can help you by facilitating care more promptly, often allowing for earlier appointments, access to diagnostic testing, procedures, and other specialty referrals.

## 2024-06-06 NOTE — Progress Notes (Signed)
 06/06/2024 Carmen Buchanan [id number] [birth year]   HISTORY OF PRESENT ILLNESS: This is a 68 year old female who was previously patient of Dr. Eda, only known to her for colonoscopy in 2021.  She is here today for evaluation of anemia and positive blood in her stool.  Hemoglobin 2 years ago was 12.6 g and has progressively been decreasing to 11.5 g 7 months ago and now 9.8 g yesterday.  Has a positive fecal immunochemistry.  B12 levels are low, likely due to her previous sleeve gastrectomy.  PCP is supplementing that with injections.  Ferritin low as well, other iron studies not checked.  PCP also discussed setting up IV iron infusions.  Patient had little benefit with side effects from p.o. iron supplementation previously.  She denies any black or bloody stool.  She does have acid reflux issues and has been on longstanding omeprazole  20 mg twice daily.  Still has occasional reflux symptoms despite that.  No previous EGD.    Last month she had an episode of severe epigastric abdominal pain that went straight through to her back.  Went to the emergency department and CT scan and ultrasound did not show any acute cause of symptoms.  She did have elevated LFTs.  LFTs on repeat as an outpatient had normalized.  CT scan did show mild increased caliber of the proximal jejunum just beyond the gastrojejunal anastomosis similar to prior scan in 2023.  She tells me that her bowel movements are frequent, often very soft.  She says that she has just gotten used to them being that way.  She does take MiraLAX  daily.  CT scan of the abdomen and pelvis without contrast 04/2024:  IMPRESSION: 1. No acute findings in the abdomen or pelvis. 2. Mild increased caliber of the proximal jejunum just beyond gastrojejunal anastomosis, similar to the prior exam from 02/28/2022. In a patient with a history of prior gastric bypass surgery, if there is a clinical concern for acute bowel pathology, CT abdomen and  pelvis with both iv and oral contrast material is recommended. 3. Small stone within the inferior pole of the right kidney measuring 3 mm. No hydronephrosis or mass identified. 4. Exophytic calcified fibroid arising off the right posterior uterine fundus measuring 4.6 cm. No adnexal mass.  Ultrasound 04/2024:  Normal.  Colonoscopy April 2021 showed internal and external hemorrhoids.  Past Medical History:  Diagnosis Date   Anemia    past hx since bariatric surgery 2012- Iron infusion last 09-2019   Anxiety    much improved after weight loss started in late 2012   Aortic aneurysm (HCC)    Arthritis    CKD (chronic kidney disease) stage 3, GFR 30-59 ml/min (HCC)    Complication of anesthesia    after hemorrhoid surgery-inability to sleep for 3 weeks   Depression    GERD (gastroesophageal reflux disease)    Hemorrhoid    HLD (hyperlipidemia)    HTN (hypertension)    stopped meds after bariatric surgery   IBS (irritable bowel syndrome)    intermittent   Migraine, unspecified, without mention of intractable migraine without mention of status migrainosus    Obesity    Renal stones    Past Surgical History:  Procedure Laterality Date   ABLATION     uterus   CESAREAN SECTION     CYSTOSCOPY N/A 07/14/2013   Procedure: CYSTOSCOPY;  Surgeon: Ricardo Likens, MD;  Location: [facility];  Service: Urology;  Laterality: N/A;   GASTRIC BYPASS  sleeve procedure 06/2011 in [facility]   HEMORRHOID SURGERY  2013   LASER ABLATION CONDYLOMA CERVICAL / VULVAR  01/2003   uncontrollable bleeding   LUMBAR DISC SURGERY  03/2000   L4-5; Dr. Drury   NEPHROLITHOTOMY Right 07/14/2013   Procedure: NEPHROLITHOTOMY PERCUTANEOUS  Procedure: Right 1st stage Percutaneous Nephrostolithotomy with surgeon access and cystoscopy;  Surgeon: Ricardo Likens, MD;  Location: [facility];  Service: Urology;  Laterality: Right;   NEPHROLITHOTOMY Right 07/16/2013   Procedure: NEPHROLITHOTOMY PERCUTANEOUS SECOND LOOK,  diagnostic ureteroscopy and stent placement on the right ;  Surgeon: Ricardo Likens, MD;  Location: [facility];  Service: Urology;  Laterality: Right;   TONSILLECTOMY AND ADENOIDECTOMY  1970    reports that she has never smoked. She has never used smokeless tobacco. She reports that she does not currently use alcohol. She reports that she does not use drugs. family history includes Aortic aneurysm in her mother; Colon polyps in her father, mother, and another family member; Coronary artery disease in her father; Gallbladder disease in her brother and maternal grandmother; Heart attack in her father and paternal grandmother; Heart disease in her brother and mother; Other in her mother; Peptic Ulcer in her sister; Ulcers in her mother. Allergies  Allergen Reactions   Ciprofloxacin     REACTION: nausea   Latex    Lisinopril     Elevated Cr   Morphine     REACTION: HA and NAV, but tolerates hydrocodone    Nsaids     Due to gastric bypass      Outpatient Encounter Medications as of 06/06/2024  Medication Sig   allopurinol (ZYLOPRIM) 100 MG tablet Take 100 mg by mouth daily.   buPROPion  (WELLBUTRIN  SR) 150 MG 12 hr tablet TAKE 1 TABLET BY MOUTH TWICE A DAY   cyanocobalamin  (VITAMIN B12) 1000 MCG/ML injection 1000 mcg IM weekly x 4 weeks then monthly thereafter   ezetimibe  (ZETIA ) 10 MG tablet Take 1 tablet (10 mg total) by mouth daily.   fluconazole  (DIFLUCAN ) 100 MG tablet Take 100mg  1-2 times per week as needed.   FLUoxetine  (PROZAC ) 40 MG capsule TAKE 2 CAPSULES BY MOUTH EVERY DAY   loratadine  (CLARITIN ) 10 MG tablet Take 1 tablet (10 mg total) by mouth every other day as needed for itching.   LORazepam  (ATIVAN ) 0.5 MG tablet Take 1 tablet (0.5 mg total) by mouth daily as needed.   nystatin  (MYCOSTATIN /NYSTOP ) powder Apply 1 Application topically in the morning and at bedtime. Patient may need early refill due to quantity required   omeprazole  (PRILOSEC) 20 MG capsule TAKE 1 CAPSULE IN THE  MORNING AND AT BEDTIME.   ondansetron  (ZOFRAN ) 4 MG tablet Take 1 tablet (4 mg total) by mouth every 8 (eight) hours as needed for nausea or vomiting.   polyethylene glycol (MIRALAX  / GLYCOLAX ) packet Take 17 g by mouth daily.   No facility-administered encounter medications on file as of 06/06/2024.     REVIEW OF SYSTEMS  : All other systems reviewed and negative except where noted in the History of Present Illness.   PHYSICAL EXAM: BP 116/66 (BP Location: Left Arm, Patient Position: Sitting, Cuff Size: Large)   Pulse 80   Ht 5' 7 (1.702 m)   Wt 237 lb 6 oz (107.7 kg)   BMI 37.18 kg/m  General: Well developed white female in no acute distress Head: Normocephalic and atraumatic Eyes:  Sclerae anicteric, conjunctiva pink. Ears: Normal auditory acuity Lungs: Clear throughout to auscultation; no W/R/R. Heart: Regular rate  and rhythm; no M/R/G. Abdomen: Soft, non-distended.  BS present.  Mild upper abdominal TTP. Rectal:  Will be done at the time of colonoscopy. Musculoskeletal: Symmetrical with no gross deformities  Skin: No lesions on visible extremities Extremities: No edema  Neurological: Alert oriented x 4, grossly non-focal Psychological:  Alert and cooperative. Normal mood and affect  ASSESSMENT AND PLAN: *Anemia and positive stools: Hemoglobin 2 years ago was 12.6 g and has progressively been decreasing to 11.5 g 7 months ago and now 9.8 g yesterday.  Has a positive fecal immunochemistry.  B12 levels are low, likely due to her previous sleeve gastrectomy.  PCP is supplementing that with injections.  Ferritin low as well, other iron studies not checked.  PCP also discussed setting up IV iron infusions.  Patient had little benefit with side effects from p.o. iron supplementation previously.  Last colonoscopy April 2021.  Has never had an EGD.  No black or bloody stool. *GERD: Has been on longstanding omeprazole  20 mg twice daily.  Still has occasional reflux symptoms despite that.   EGD as above. *Epigastric abdominal pain: Last month she had an episode of severe epigastric abdominal pain that went straight through to her back.  Went to the emergency department and CT scan and ultrasound did not show any acute cause of symptoms.  She did have elevated LFTs.  LFTs on repeat as an outpatient had normalized.  Question is possible that she had passed a gallstone. *Abnormal CT scan of the small bowel: CT scan from last month showed mild increased caliber of the proximal jejunum just beyond the gastrojejunal anastomosis similar to prior scan in 2023.  If possible take attention to this during EGD.  -EGD and colonoscopy scheduled with Dr. Wilhelmenia.  The risks, benefits, and alternatives to EGD and colonoscopy were discussed with the patient and she consents to proceed.  - Iron and vitamin B12 supplementation per PCP.   CC:  Cleatus Arlyss RAMAN, MD

## 2024-06-07 NOTE — Progress Notes (Signed)
 Attending Physician's Attestation   I have reviewed the chart.   I agree with the Advanced Practitioner's note, impression, and recommendations with any updates as below. Endoscopic evaluation is reasonable.  Hopefully this ends of the just related to her anatomy.  Please change the patient's procedure from an EGD to an enteroscopy, we will try to reach that area, but it is possible that we will not be able to based on the anatomy that she has.  She may only be able to have this area looked at/evaluated with balloon enteroscopy if push enteroscopy is not possible.  Capsule study could be required in future.   Aloha Finner, MD [facility] Advanced Endoscopy Office # [phone]

## 2024-06-08 ENCOUNTER — Ambulatory Visit: Payer: Self-pay | Admitting: Family Medicine

## 2024-06-09 ENCOUNTER — Other Ambulatory Visit: Payer: Self-pay | Admitting: Emergency Medicine

## 2024-06-09 DIAGNOSIS — Z79899 Other long term (current) drug therapy: Secondary | ICD-10-CM

## 2024-06-09 NOTE — Telephone Encounter (Signed)
 Called patient has not heard from scheduling. I have called they have requested that we fax order to Fax [phone]. I am out of the office sent message to [place] to fax as requested.

## 2024-06-11 NOTE — Telephone Encounter (Signed)
 Thank you for seeing this patient.  Is there any way to get her enteroscopy moved sooner?  I greatly appreciate your help.

## 2024-06-11 NOTE — Progress Notes (Signed)
 Note made on patient appointment per [facility] recommendation.

## 2024-06-12 ENCOUNTER — Ambulatory Visit (INDEPENDENT_AMBULATORY_CARE_PROVIDER_SITE_OTHER)

## 2024-06-12 DIAGNOSIS — E538 Deficiency of other specified B group vitamins: Secondary | ICD-10-CM | POA: Diagnosis not present

## 2024-06-12 MED ORDER — CYANOCOBALAMIN 1000 MCG/ML IJ SOLN
1000.0000 ug | Freq: Once | INTRAMUSCULAR | Status: AC
  Administered 2024-06-12: 1000 ug via INTRAMUSCULAR

## 2024-06-12 NOTE — Telephone Encounter (Signed)
 Dr. Wilhelmenia does not have anything sooner.

## 2024-06-12 NOTE — Progress Notes (Signed)
 Per orders of Dr. Arlyss Solian, injection of B-12 given by Delores, Annabella Elford D in left deltoid. Patient tolerated injection well. Patient will make appointment for 1 week.

## 2024-06-17 ENCOUNTER — Other Ambulatory Visit: Payer: Self-pay

## 2024-06-17 ENCOUNTER — Other Ambulatory Visit: Payer: Self-pay | Admitting: Family Medicine

## 2024-06-18 ENCOUNTER — Telehealth: Payer: Self-pay | Admitting: Pharmacy Technician

## 2024-06-18 MED ORDER — IRON SUCROSE 20 MG/ML IV SOLN: INTRAVENOUS | Status: DC

## 2024-06-18 NOTE — Telephone Encounter (Signed)
 Greatly appreciate your help.

## 2024-06-18 NOTE — Telephone Encounter (Signed)
 Please see if she can get set up for venofer .  Thanks.

## 2024-06-18 NOTE — Telephone Encounter (Signed)
 Dr. Cleatus, We will schedule the patient as soon as possible.  Thanks Luke

## 2024-06-18 NOTE — Telephone Encounter (Signed)
 Dr. Cleatus, Allegra will be denied if patient has not failed Venofer . Venofer  is the preferred medication. Would you like to try Venofer ?  Auth Submission: NO AUTH NEEDED Site of care: Site of care: [facility] Payer: [employer] MEDICARE Medication & CPT/J Code(s) submitted: Venofer  (Iron  Sucrose) J1756 Diagnosis Code: D50.9 Route of submission (phone, fax, portal): PORTAL Phone # Fax # Auth type: Buy/Bill PB Units/visits requested:  Reference number:  Approval from: 06/18/24 to 09/24/24

## 2024-06-19 ENCOUNTER — Other Ambulatory Visit (HOSPITAL_COMMUNITY): Payer: Self-pay | Admitting: Family Medicine

## 2024-06-19 ENCOUNTER — Ambulatory Visit

## 2024-06-19 ENCOUNTER — Telehealth: Payer: Self-pay

## 2024-06-19 DIAGNOSIS — E538 Deficiency of other specified B group vitamins: Secondary | ICD-10-CM | POA: Diagnosis not present

## 2024-06-19 MED ORDER — CYANOCOBALAMIN 1000 MCG/ML IJ SOLN
1000.0000 ug | Freq: Once | INTRAMUSCULAR | Status: AC
  Administered 2024-06-19: 1000 ug via INTRAMUSCULAR

## 2024-06-19 NOTE — Telephone Encounter (Signed)
 Noted. Thanks.

## 2024-06-19 NOTE — Progress Notes (Signed)
 Per orders of Dr. Arlyss Solian, injection of B-12 given by Delores No D in right deltoid. Patient tolerated injection well. Patient will make appointment for 1 week.

## 2024-06-19 NOTE — Telephone Encounter (Signed)
 Dr. Cleatus, patient will be scheduled as soon as possible.  Auth Submission: NO AUTH NEEDED Site of care: Site of care: [facility] Payer: [employer] medicare Medication & CPT/J Code(s) submitted: Venofer  (Iron  Sucrose) J1756 Diagnosis Code:  Route of submission (phone, fax, portal):  Phone # Fax # Auth type: Buy/Bill PB Units/visits requested: 200mg  x 5 doses Reference number:  Approval from: 06/19/24 to 09/24/24

## 2024-06-22 ENCOUNTER — Other Ambulatory Visit: Payer: Self-pay | Admitting: Family Medicine

## 2024-07-02 ENCOUNTER — Other Ambulatory Visit: Payer: Self-pay | Admitting: Family Medicine

## 2024-07-03 ENCOUNTER — Encounter: Payer: Self-pay | Admitting: Family Medicine

## 2024-07-03 ENCOUNTER — Ambulatory Visit

## 2024-07-06 ENCOUNTER — Other Ambulatory Visit: Payer: Self-pay | Admitting: Family Medicine

## 2024-07-06 DIAGNOSIS — E538 Deficiency of other specified B group vitamins: Secondary | ICD-10-CM

## 2024-07-06 MED ORDER — CYANOCOBALAMIN 1000 MCG/ML IJ SOLN
INTRAMUSCULAR | Status: AC
Start: 2024-07-06 — End: ?

## 2024-07-06 NOTE — Telephone Encounter (Signed)
 Please call patient about getting her set up for monthly B12 shots at this point.  I would like to recheck her B12 level before her third monthly B12 shot.  It can be done on the same day as the third monthly B12 shot, but just before the dose is given.  Thanks.

## 2024-07-07 ENCOUNTER — Ambulatory Visit

## 2024-07-07 DIAGNOSIS — I10 Essential (primary) hypertension: Secondary | ICD-10-CM | POA: Diagnosis not present

## 2024-07-07 DIAGNOSIS — N184 Chronic kidney disease, stage 4 (severe): Secondary | ICD-10-CM | POA: Diagnosis not present

## 2024-07-07 DIAGNOSIS — N2581 Secondary hyperparathyroidism of renal origin: Secondary | ICD-10-CM | POA: Diagnosis not present

## 2024-07-07 DIAGNOSIS — N390 Urinary tract infection, site not specified: Secondary | ICD-10-CM | POA: Diagnosis not present

## 2024-07-07 NOTE — Telephone Encounter (Signed)
 Called and scheduled patient for 1st monthly b12 injection.

## 2024-07-09 ENCOUNTER — Encounter: Payer: Self-pay | Admitting: Gastroenterology

## 2024-07-09 ENCOUNTER — Ambulatory Visit (AMBULATORY_SURGERY_CENTER): Admitting: Gastroenterology

## 2024-07-09 VITALS — BP 142/62 | HR 58 | Temp 97.9°F | Resp 18 | Ht 67.0 in | Wt 237.0 lb

## 2024-07-09 DIAGNOSIS — F32A Depression, unspecified: Secondary | ICD-10-CM | POA: Diagnosis not present

## 2024-07-09 DIAGNOSIS — I10 Essential (primary) hypertension: Secondary | ICD-10-CM | POA: Diagnosis not present

## 2024-07-09 DIAGNOSIS — Z862 Personal history of diseases of the blood and blood-forming organs and certain disorders involving the immune mechanism: Secondary | ICD-10-CM

## 2024-07-09 DIAGNOSIS — Z903 Acquired absence of stomach [part of]: Secondary | ICD-10-CM | POA: Diagnosis not present

## 2024-07-09 DIAGNOSIS — K5730 Diverticulosis of large intestine without perforation or abscess without bleeding: Secondary | ICD-10-CM | POA: Diagnosis not present

## 2024-07-09 DIAGNOSIS — K219 Gastro-esophageal reflux disease without esophagitis: Secondary | ICD-10-CM

## 2024-07-09 DIAGNOSIS — D123 Benign neoplasm of transverse colon: Secondary | ICD-10-CM | POA: Diagnosis not present

## 2024-07-09 DIAGNOSIS — D509 Iron deficiency anemia, unspecified: Secondary | ICD-10-CM

## 2024-07-09 DIAGNOSIS — Z980 Intestinal bypass and anastomosis status: Secondary | ICD-10-CM

## 2024-07-09 DIAGNOSIS — K319 Disease of stomach and duodenum, unspecified: Secondary | ICD-10-CM | POA: Diagnosis not present

## 2024-07-09 DIAGNOSIS — K644 Residual hemorrhoidal skin tags: Secondary | ICD-10-CM

## 2024-07-09 DIAGNOSIS — K2289 Other specified disease of esophagus: Secondary | ICD-10-CM

## 2024-07-09 DIAGNOSIS — D120 Benign neoplasm of cecum: Secondary | ICD-10-CM | POA: Diagnosis not present

## 2024-07-09 DIAGNOSIS — F419 Anxiety disorder, unspecified: Secondary | ICD-10-CM | POA: Diagnosis not present

## 2024-07-09 DIAGNOSIS — R195 Other fecal abnormalities: Secondary | ICD-10-CM | POA: Diagnosis not present

## 2024-07-09 DIAGNOSIS — K562 Volvulus: Secondary | ICD-10-CM | POA: Diagnosis not present

## 2024-07-09 DIAGNOSIS — K3189 Other diseases of stomach and duodenum: Secondary | ICD-10-CM

## 2024-07-09 DIAGNOSIS — K642 Third degree hemorrhoids: Secondary | ICD-10-CM

## 2024-07-09 DIAGNOSIS — E669 Obesity, unspecified: Secondary | ICD-10-CM | POA: Diagnosis not present

## 2024-07-09 MED ORDER — FLUCONAZOLE 100 MG PO TABS: ORAL_TABLET | ORAL | 0 refills | Status: DC

## 2024-07-09 MED ORDER — SODIUM CHLORIDE 0.9 % IV SOLN: 500.0000 mL | Freq: Once | INTRAVENOUS | Status: DC

## 2024-07-09 NOTE — Op Note (Signed)
 [facility] Patient Name: Carmen Buchanan Procedure Date: 07/09/2024 2:42 PM MRN: [id number] Endoscopist: Aloha Finner , MD, [id number] Age: 68 Referring MD:  Date of Birth: 08/05/1956 Gender: Female Account #: [id number] Procedure:                Colonoscopy Indications:              Iron  deficiency anemia Medicines:                Monitored Anesthesia Care Procedure:                Pre-Anesthesia Assessment:                           - Prior to the procedure, a History and Physical                            was performed, and patient medications and                            allergies were reviewed. The patient's tolerance of                            previous anesthesia was also reviewed. The risks                            and benefits of the procedure and the sedation                            options and risks were discussed with the patient.                            All questions were answered, and informed consent                            was obtained. Prior Anticoagulants: The patient has                            taken no anticoagulant or antiplatelet agents. ASA                            Grade Assessment: III - A patient with severe                            systemic disease. After reviewing the risks and                            benefits, the patient was deemed in satisfactory                            condition to undergo the procedure.                           After obtaining informed consent, the colonoscope  was passed under direct vision. Throughout the                            procedure, the patient's blood pressure, pulse, and                            oxygen saturations were monitored continuously. The                            Olympus CF-HQ190L ([id number]) Colonoscope was                            introduced through the anus and advanced to the the                            cecum, identified by  appendiceal orifice and                            ileocecal valve. The colonoscopy was somewhat                            difficult due to a redundant colon and significant                            looping. Successful completion of the procedure was                            aided by changing the patient's position, using                            manual pressure, straightening and shortening the                            scope to obtain bowel loop reduction and using                            scope torsion. The patient tolerated the procedure.                            The quality of the bowel preparation was adequate.                            The ileocecal valve, appendiceal orifice, and                            rectum were photographed. Scope In: 3:00:37 PM Scope Out: 3:21:17 PM Scope Withdrawal Time: 0 hours 0 minutes 2 seconds  Total Procedure Duration: 0 hours 20 minutes 40 seconds  Findings:                 Skin tags were found on perianal exam.                           The digital rectal exam findings include  hemorrhoids. Pertinent negatives include no                            palpable rectal lesions.                           The colon (entire examined portion) revealed                            significantly excessive looping from redundancy.                           Two sessile polyps were found in the hepatic                            flexure and cecum. The polyps were 2 to 4 mm in                            size. These polyps were removed with a cold snare.                            Resection and retrieval were complete.                           Multiple small-mouthed diverticula were found in                            the recto-sigmoid colon and sigmoid colon.                           Normal mucosa was found in the entire colon                            otherwise.                           Non-bleeding non-thrombosed  external and internal                            hemorrhoids were found during retroflexion, during                            perianal exam and during digital exam. The                            hemorrhoids were Grade III (internal hemorrhoids                            that prolapse but require manual reduction). Complications:            No immediate complications. Estimated Blood Loss:     Estimated blood loss was minimal. Impression:               - Perianal skin tags found on perianal exam.  Hemorrhoids found on digital rectal exam.                           - There was significant looping of the colon.                           - Two 2 to 4 mm polyps at the hepatic flexure and                            in the cecum, removed with a cold snare. Resected                            and retrieved.                           - Diverticulosis in the recto-sigmoid colon and in                            the sigmoid colon.                           - Normal mucosa in the entire examined colon                            otherwise.                           - Non-bleeding non-thrombosed external and internal                            hemorrhoids. Recommendation:           - The patient will be observed post-procedure,                            until all discharge criteria are met.                           - Discharge patient to home.                           - Patient has a contact number available for                            emergencies. The signs and symptoms of potential                            delayed complications were discussed with the                            patient. Return to normal activities tomorrow.                            Written discharge instructions were provided to the  patient.                           - High fiber diet.                           - Use FiberCon 1-2 tablets PO daily.                            - Continue present medications.                           - Await pathology results.                           - Repeat colonoscopy in 5-10 years for surveillance                            based on pathology results.                           - If issues of blood counts persist after B12 and                            Iron  supplementation then will consider VCE.                            However with her underlying CRI, this could be an                            issue as well.                           - The findings and recommendations were discussed                            with the patient.                           - The findings and recommendations were discussed                            with the designated responsible adult. Aloha Finner, MD 07/09/2024 3:35:09 PM

## 2024-07-09 NOTE — Progress Notes (Signed)
 GASTROENTEROLOGY PROCEDURE H&P NOTE   Primary Care Physician: Cleatus Arlyss RAMAN, MD  HPI: Carmen Buchanan is a 68 y.o. female who presents for Enteroscopy/Colonoscopy for evaluation of low b12, iron  deficiency, anemia, and positive FOBT.  Past Medical History:  Diagnosis Date   Anemia    past hx since bariatric surgery 2012- Iron  infusion last 09-2019   Anxiety    much improved after weight loss started in late 2012   Aortic aneurysm    Arthritis    CKD (chronic kidney disease) stage 3, GFR 30-59 ml/min (HCC)    Complication of anesthesia    after hemorrhoid surgery-inability to sleep for 3 weeks   Depression    GERD (gastroesophageal reflux disease)    Hemorrhoid    HLD (hyperlipidemia)    HTN (hypertension)    stopped meds after bariatric surgery   IBS (irritable bowel syndrome)    intermittent   Migraine, unspecified, without mention of intractable migraine without mention of status migrainosus    Obesity    Renal stones    Past Surgical History:  Procedure Laterality Date   ABLATION     uterus   CESAREAN SECTION     CYSTOSCOPY N/A 07/14/2013   Procedure: CYSTOSCOPY;  Surgeon: Ricardo Likens, MD;  Location: [facility];  Service: Urology;  Laterality: N/A;   GASTRIC BYPASS     sleeve procedure 06/2011 in [facility]   HEMORRHOID SURGERY  2013   LASER ABLATION CONDYLOMA CERVICAL / VULVAR  01/2003   uncontrollable bleeding   LUMBAR DISC SURGERY  03/2000   L4-5; Dr. Drury   NEPHROLITHOTOMY Right 07/14/2013   Procedure: NEPHROLITHOTOMY PERCUTANEOUS  Procedure: Right 1st stage Percutaneous Nephrostolithotomy with surgeon access and cystoscopy;  Surgeon: Ricardo Likens, MD;  Location: [facility];  Service: Urology;  Laterality: Right;   NEPHROLITHOTOMY Right 07/16/2013   Procedure: NEPHROLITHOTOMY PERCUTANEOUS SECOND LOOK, diagnostic ureteroscopy and stent placement on the right ;  Surgeon: Ricardo Likens, MD;  Location: [facility];  Service: Urology;  Laterality: Right;    TONSILLECTOMY AND ADENOIDECTOMY  1970   Current Outpatient Medications  Medication Sig Dispense Refill   allopurinol (ZYLOPRIM) 100 MG tablet Take 100 mg by mouth daily.     buPROPion  (WELLBUTRIN  SR) 150 MG 12 hr tablet TAKE 1 TABLET BY MOUTH TWICE A DAY 180 tablet 1   ezetimibe  (ZETIA ) 10 MG tablet Take 1 tablet (10 mg total) by mouth daily. 90 tablet 3   FLUoxetine  (PROZAC ) 40 MG capsule TAKE 2 CAPSULES BY MOUTH EVERY DAY 180 capsule 1   omeprazole  (PRILOSEC) 20 MG capsule TAKE 1 CAPSULE IN THE MORNING AND AT BEDTIME. 180 capsule 3   polyethylene glycol (MIRALAX  / GLYCOLAX ) packet Take 17 g by mouth daily.     cyanocobalamin  (VITAMIN B12) 1000 MCG/ML injection 1000 mcg IM monthly     fluconazole  (DIFLUCAN ) 100 MG tablet TAKE 100MG  1-2 TIMES PER WEEK AS NEEDED. 20 tablet 0   loratadine  (CLARITIN ) 10 MG tablet Take 1 tablet (10 mg total) by mouth every other day as needed for itching.     LORazepam  (ATIVAN ) 0.5 MG tablet Take 1 tablet (0.5 mg total) by mouth daily as needed. 30 tablet 1   nystatin  (MYCOSTATIN /NYSTOP ) powder Apply 1 Application topically in the morning and at bedtime. Patient may need early refill due to quantity required 60 g 1   ondansetron  (ZOFRAN ) 4 MG tablet Take 1 tablet (4 mg total) by mouth every 8 (eight) hours as needed for nausea or  vomiting. 20 tablet 0   Current Facility-Administered Medications  Medication Dose Route Frequency Provider Last Rate Last Admin   0.9 %  sodium chloride  infusion  500 mL Intravenous Once Mansouraty, Vannie Hochstetler Jr., MD        Current Outpatient Medications:    allopurinol (ZYLOPRIM) 100 MG tablet, Take 100 mg by mouth daily., Disp: , Rfl:    buPROPion  (WELLBUTRIN  SR) 150 MG 12 hr tablet, TAKE 1 TABLET BY MOUTH TWICE A DAY, Disp: 180 tablet, Rfl: 1   ezetimibe  (ZETIA ) 10 MG tablet, Take 1 tablet (10 mg total) by mouth daily., Disp: 90 tablet, Rfl: 3   FLUoxetine  (PROZAC ) 40 MG capsule, TAKE 2 CAPSULES BY MOUTH EVERY DAY, Disp: 180 capsule,  Rfl: 1   omeprazole  (PRILOSEC) 20 MG capsule, TAKE 1 CAPSULE IN THE MORNING AND AT BEDTIME., Disp: 180 capsule, Rfl: 3   polyethylene glycol (MIRALAX  / GLYCOLAX ) packet, Take 17 g by mouth daily., Disp: , Rfl:    cyanocobalamin  (VITAMIN B12) 1000 MCG/ML injection, 1000 mcg IM monthly, Disp: , Rfl:    fluconazole  (DIFLUCAN ) 100 MG tablet, TAKE 100MG  1-2 TIMES PER WEEK AS NEEDED., Disp: 20 tablet, Rfl: 0   loratadine  (CLARITIN ) 10 MG tablet, Take 1 tablet (10 mg total) by mouth every other day as needed for itching., Disp: , Rfl:    LORazepam  (ATIVAN ) 0.5 MG tablet, Take 1 tablet (0.5 mg total) by mouth daily as needed., Disp: 30 tablet, Rfl: 1   nystatin  (MYCOSTATIN /NYSTOP ) powder, Apply 1 Application topically in the morning and at bedtime. Patient may need early refill due to quantity required, Disp: 60 g, Rfl: 1   ondansetron  (ZOFRAN ) 4 MG tablet, Take 1 tablet (4 mg total) by mouth every 8 (eight) hours as needed for nausea or vomiting., Disp: 20 tablet, Rfl: 0  Current Facility-Administered Medications:    0.9 %  sodium chloride  infusion, 500 mL, Intravenous, Once, Mansouraty, Aloha Raddle., MD Allergies  Allergen Reactions   Ciprofloxacin Nausea Only    REACTION: nausea   Latex Itching   Lisinopril Other (See Comments)    Elevated Cr   Morphine Nausea Only    REACTION: HA and NAV, but tolerates hydrocodone    Nsaids Other (See Comments)    Due to gastric bypass   Family History  Problem Relation Age of Onset   Aortic aneurysm Mother        on beta blocker   Other Mother        Cardiovascualr disease   Ulcers Mother    Colon polyps Mother    Heart disease Mother    Coronary artery disease Father        MI; CABG   Colon polyps Father    Heart attack Father    Peptic Ulcer Sister    Gallbladder disease Brother    Heart disease Brother    Gallbladder disease Maternal Grandmother    Heart attack Paternal Grandmother    Colon polyps Other        Family history   Colon cancer  Neg Hx    Breast cancer Neg Hx    Esophageal cancer Neg Hx    Rectal cancer Neg Hx    Stomach cancer Neg Hx    Social History   Socioeconomic History   Marital status: Married    Spouse name: Not on file   Number of children: 1   Years of education: Not on file   Highest education level: 12th grade  Occupational History  Occupation: Sales executive  Tobacco Use   Smoking status: Never   Smokeless tobacco: Never  Vaping Use   Vaping status: Never Used  Substance and Sexual Activity   Alcohol use: Not Currently   Drug use: No   Sexual activity: Not on file  Other Topics Concern   Not on file  Social History Narrative   Married; 1982   1 son   Sales executive in [employer]   Likes to [employer], church, time with family   Minimal exercise   Social Drivers of Health   Financial Resource Strain: Low Risk  (03/27/2024)   Overall Financial Resource Strain (CARDIA)    Difficulty of Paying Living Expenses: Not hard at all  Food Insecurity: No Food Insecurity (03/27/2024)   Hunger Vital Sign    Worried About Running Out of Food in the Last Year: Never true    Ran Out of Food in the Last Year: Never true  Transportation Needs: No Transportation Needs (03/27/2024)   PRAPARE - Administrator, Civil Service (Medical): No    Lack of Transportation (Non-Medical): No  Physical Activity: Insufficiently Active (03/27/2024)   Exercise Vital Sign    Days of Exercise per Week: 2 days    Minutes of Exercise per Session: 30 min  Stress: Stress Concern Present (03/27/2024)   [employer] of Occupational Health - Occupational Stress Questionnaire    Feeling of Stress: Rather much  Social Connections: Socially Integrated (03/27/2024)   Social Connection and Isolation Panel    Frequency of Communication with Friends and Family: More than three times a week    Frequency of Social Gatherings with Friends and Family: Once a week    Attends Religious Services: More than 4 times per year     Active Member of [employer] or Organizations: Yes    Attends Banker Meetings: More than 4 times per year    Marital Status: Married  Catering manager Violence: Not At Risk (03/27/2024)   Humiliation, Afraid, Rape, and Kick questionnaire    Fear of Current or Ex-Partner: No    Emotionally Abused: No    Physically Abused: No    Sexually Abused: No    Physical Exam: Today's Vitals   07/09/24 1355 07/09/24 1401  BP: (!) 144/61   Pulse: 61   Temp: 97.9 F (36.6 C) 97.9 F (36.6 C)  SpO2: 98%   Weight: 237 lb (107.5 kg)   Height: 5' 7 (1.702 m)    Body mass index is 37.12 kg/m. GEN: NAD EYE: Sclerae anicteric ENT: MMM CV: Non-tachycardic GI: Soft, NT/ND NEURO:  Alert & Oriented x 3  Lab Results: No results for input(s): WBC, HGB, HCT, PLT in the last 72 hours. BMET No results for input(s): NA, K, CL, CO2, GLUCOSE, BUN, CREATININE, CALCIUM in the last 72 hours. LFT No results for input(s): PROT, ALBUMIN, AST, ALT, ALKPHOS, BILITOT, BILIDIR, IBILI in the last 72 hours. PT/INR No results for input(s): LABPROT, INR in the last 72 hours.   Impression / Plan: This is a 68 y.o.female who presents for Enteroscopy/Colonoscopy for evaluation of low b12, iron  deficiency, anemia, and positive FOBT.  The risks and benefits of endoscopic evaluation/treatment were discussed with the patient and/or family; these include but are not limited to the risk of perforation, infection, bleeding, missed lesions, lack of diagnosis, severe illness requiring hospitalization, as well as anesthesia and sedation related illnesses.  The patient's history has been reviewed, patient examined, no change in status,  and deemed stable for procedure.  The patient and/or family is agreeable to proceed.    Aloha Finner, MD [facility] Office # [phone]

## 2024-07-09 NOTE — Op Note (Signed)
 [facility] Patient Name: Carmen Buchanan Procedure Date: 07/09/2024 2:42 PM MRN: [id number] Endoscopist: Aloha Finner , MD, [id number] Age: 68 Referring MD:  Date of Birth: 1955-12-17 Gender: Female Account #: [id number] Procedure:                Upper GI endoscopy Indications:              Iron  deficiency anemia, Pernicious anemia Medicines:                Monitored Anesthesia Care Procedure:                Pre-Anesthesia Assessment:                           - Prior to the procedure, a History and Physical                            was performed, and patient medications and                            allergies were reviewed. The patient's tolerance of                            previous anesthesia was also reviewed. The risks                            and benefits of the procedure and the sedation                            options and risks were discussed with the patient.                            All questions were answered, and informed consent                            was obtained. Prior Anticoagulants: The patient has                            taken no anticoagulant or antiplatelet agents. ASA                            Grade Assessment: III - A patient with severe                            systemic disease. After reviewing the risks and                            benefits, the patient was deemed in satisfactory                            condition to undergo the procedure.                           After obtaining informed consent, the endoscope was  passed under direct vision. Throughout the                            procedure, the patient's blood pressure, pulse, and                            oxygen saturations were monitored continuously. The                            GIF [id number] #[id number] was introduced through the                            mouth, and advanced to the afferent and efferent                            jejunal  loops. The upper GI endoscopy was                            accomplished without difficulty. The patient                            tolerated the procedure. Scope In: Scope Out: Findings:                 No gross lesions were noted in the entire esophagus.                           The Z-line was irregular and was found 41 cm from                            the incisors.                           Evidence of a sleeve gastrectomy was found in the                            gastric body. This was characterized by healthy                            appearing mucosa.                           Patchy moderately erythematous mucosa without                            bleeding was found in the rest of the examined                            stomach. Biopsies were taken with a cold forceps                            for histology and Helicobacter pylori testing.                           Evidence of a gastroenterostomy was found in  the                            anastomosis. This was characterized by healthy                            appearing mucosa. She appears to have undergone a                            Mni-Gastric Bypass (more than just sleeve                            gastrectomy).                           Normal mucosa was found in the afferent and                            efferent limbs of the jejunum. Biopsies were taken                            with a cold forceps for histology to rule out                            Celiac. Complications:            No immediate complications. Estimated Blood Loss:     Estimated blood loss was minimal. Impression:               - No gross lesions in the entire esophagus. Z-line                            irregular, 41 cm from the incisors.                           - A sleeve gastrectomy was found, characterized by                            healthy appearing mucosa.                           - Erythematous mucosa in the stomach. Biopsied.                            - A gastroenterostomy was found, characterized by                            healthy appearing mucosa. She appears to have                            undergone a MiniGastric Bypass with Gastric sleeve                            procedure (rather than solely a Gastric sleeve).                           -  Normal mucosa was found in the examined                            afferent/efferent jejunum. Biopsied. Recommendation:           - Proceed to scheduled colonoscopy.                           - Continue present medications.                           - Await pathology results.                           - The findings and recommendations were discussed                            with the patient.                           - The findings and recommendations were discussed                            with the designated responsible adult. Aloha Finner, MD 07/09/2024 3:30:46 PM

## 2024-07-09 NOTE — Progress Notes (Signed)
 Pt's states no medical or surgical changes since previsit or office visit.

## 2024-07-09 NOTE — Progress Notes (Signed)
 Pt A/O x 3, gd SR's, pleased with anesthesia, report to RN

## 2024-07-09 NOTE — Progress Notes (Signed)
 Called to room to assist during endoscopic procedure.  Patient ID and intended procedure confirmed with present staff. Received instructions for my participation in the procedure from the performing physician.

## 2024-07-09 NOTE — Patient Instructions (Signed)
 Please read handouts provided. Continue present medications. Await pathology results. High Fiber Diet. Use FiberCon 1-2 tablets daily. Repeat colonoscopy in 5-10 years for screening based on pathology results.   YOU HAD AN ENDOSCOPIC PROCEDURE TODAY AT THE [facility]:   Refer to the procedure report that was given to you for any specific questions about what was found during the examination.  If the procedure report does not answer your questions, please call your gastroenterologist to clarify.  If you requested that your care partner not be given the details of your procedure findings, then the procedure report has been included in a sealed envelope for you to review at your convenience later.  YOU SHOULD EXPECT: Some feelings of bloating in the abdomen. Passage of more gas than usual.  Walking can help get rid of the air that was put into your GI tract during the procedure and reduce the bloating. If you had a lower endoscopy (such as a colonoscopy or flexible sigmoidoscopy) you may notice spotting of blood in your stool or on the toilet paper. If you underwent a bowel prep for your procedure, you may not have a normal bowel movement for a few days.  Please Note:  You might notice some irritation and congestion in your nose or some drainage.  This is from the oxygen used during your procedure.  There is no need for concern and it should clear up in a day or so.  SYMPTOMS TO REPORT IMMEDIATELY:  Following lower endoscopy (colonoscopy or flexible sigmoidoscopy):  Excessive amounts of blood in the stool  Significant tenderness or worsening of abdominal pains  Swelling of the abdomen that is new, acute  Fever of 100F or higher  Following upper endoscopy (EGD)  Vomiting of blood or coffee ground material  New chest pain or pain under the shoulder blades  Painful or persistently difficult swallowing  New shortness of breath  Fever of 100F or higher  Black, tarry-looking  stools  For urgent or emergent issues, a gastroenterologist can be reached at any hour by calling [phone]. Do not use MyChart messaging for urgent concerns.    DIET:  We do recommend a small meal at first, but then you may proceed to your regular diet.  Drink plenty of fluids but you should avoid alcoholic beverages for 24 hours.  ACTIVITY:  You should plan to take it easy for the rest of today and you should NOT DRIVE or use heavy machinery until tomorrow (because of the sedation medicines used during the test).    FOLLOW UP: Our staff will call the number listed on your records the next business day following your procedure.  We will call around 7:15- 8:00 am to check on you and address any questions or concerns that you may have regarding the information given to you following your procedure. If we do not reach you, we will leave a message.     If any biopsies were taken you will be contacted by phone or by letter within the next 1-3 weeks.  Please call us  at [phone] if you have not heard about the biopsies in 3 weeks.    SIGNATURES/CONFIDENTIALITY: You and/or your care partner have signed paperwork which will be entered into your electronic medical record.  These signatures attest to the fact that that the information above on your After Visit Summary has been reviewed and is understood.  Full responsibility of the confidentiality of this discharge information lies with you and/or your care-partner.

## 2024-07-10 ENCOUNTER — Ambulatory Visit

## 2024-07-10 ENCOUNTER — Telehealth: Payer: Self-pay | Admitting: *Deleted

## 2024-07-10 DIAGNOSIS — N179 Acute kidney failure, unspecified: Secondary | ICD-10-CM | POA: Diagnosis not present

## 2024-07-10 DIAGNOSIS — D631 Anemia in chronic kidney disease: Secondary | ICD-10-CM | POA: Diagnosis not present

## 2024-07-10 DIAGNOSIS — N184 Chronic kidney disease, stage 4 (severe): Secondary | ICD-10-CM | POA: Diagnosis not present

## 2024-07-10 DIAGNOSIS — N2581 Secondary hyperparathyroidism of renal origin: Secondary | ICD-10-CM | POA: Diagnosis not present

## 2024-07-10 NOTE — Telephone Encounter (Signed)
  Follow up Call-     07/09/2024    2:01 PM  Call back number  Post procedure Call Back phone  # [phone]  Permission to leave phone message Yes     Patient questions:  Do you have a fever, pain , or abdominal swelling? No. Pain Score  0 *  Have you tolerated food without any problems? Yes.    Have you been able to return to your normal activities? Yes.    Do you have any questions about your discharge instructions: Diet   No. Medications  No. Follow up visit  No.  Do you have questions or concerns about your Care? No.  Actions: * If pain score is 4 or above: No action needed, pain <4.

## 2024-07-14 ENCOUNTER — Ambulatory Visit (INDEPENDENT_AMBULATORY_CARE_PROVIDER_SITE_OTHER)

## 2024-07-14 VITALS — BP 135/72 | HR 71 | Temp 98.9°F | Resp 20 | Ht 69.0 in | Wt 235.0 lb

## 2024-07-14 DIAGNOSIS — D631 Anemia in chronic kidney disease: Secondary | ICD-10-CM

## 2024-07-14 DIAGNOSIS — N189 Chronic kidney disease, unspecified: Secondary | ICD-10-CM | POA: Diagnosis not present

## 2024-07-14 MED ORDER — IRON SUCROSE 20 MG/ML IV SOLN
200.0000 mg | Freq: Once | INTRAVENOUS | Status: AC
  Administered 2024-07-14: 200 mg via INTRAVENOUS
  Filled 2024-07-14: qty 10

## 2024-07-14 NOTE — Progress Notes (Signed)
 Diagnosis: Iron  Deficiency Anemia  Provider:  Praveen Mannam MD  Procedure: IV Push  IV Type: Peripheral, IV Location: L Antecubital  Venofer  (Iron  Sucrose), Dose: 200 mg  Post Infusion IV Care: Observation period completed and Peripheral IV Discontinued  Discharge: Condition: Good, Destination: Home . AVS Declined  Performed by:  Maximiano JONELLE Pouch, LPN

## 2024-07-15 LAB — SURGICAL PATHOLOGY

## 2024-07-17 ENCOUNTER — Ambulatory Visit (INDEPENDENT_AMBULATORY_CARE_PROVIDER_SITE_OTHER)

## 2024-07-17 ENCOUNTER — Ambulatory Visit: Payer: Self-pay | Admitting: Gastroenterology

## 2024-07-17 VITALS — BP 132/74 | HR 62 | Temp 97.8°F | Resp 14 | Ht 69.0 in | Wt 235.2 lb

## 2024-07-17 DIAGNOSIS — D631 Anemia in chronic kidney disease: Secondary | ICD-10-CM | POA: Diagnosis not present

## 2024-07-17 DIAGNOSIS — N189 Chronic kidney disease, unspecified: Secondary | ICD-10-CM

## 2024-07-17 MED ORDER — IRON SUCROSE 20 MG/ML IV SOLN
200.0000 mg | Freq: Once | INTRAVENOUS | Status: AC
  Administered 2024-07-17: 200 mg via INTRAVENOUS
  Filled 2024-07-17: qty 10

## 2024-07-17 NOTE — Progress Notes (Signed)
 Diagnosis: Iron Deficiency Anemia  Provider:  Chilton Greathouse MD  Procedure: IV Push  IV Type: Peripheral, IV Location: R Forearm  Venofer (Iron Sucrose), Dose: 200 mg  Post Infusion IV Care: Observation period completed and Peripheral IV Discontinued  Discharge: Condition: Good, Destination: Home . AVS Declined  Performed by:  Adriana Mccallum, RN

## 2024-07-18 ENCOUNTER — Other Ambulatory Visit: Payer: Self-pay

## 2024-07-18 ENCOUNTER — Encounter: Payer: Self-pay | Admitting: Gastroenterology

## 2024-07-18 DIAGNOSIS — Z862 Personal history of diseases of the blood and blood-forming organs and certain disorders involving the immune mechanism: Secondary | ICD-10-CM

## 2024-07-18 MED ORDER — FERROUS GLUCONATE 324 (38 FE) MG PO TABS: 324.0000 mg | ORAL_TABLET | Freq: Every day | ORAL | 3 refills | Status: AC

## 2024-07-18 NOTE — Telephone Encounter (Signed)
 If she is already getting IV Iron , then it is OK no need for the PO iron . Thanks. GM

## 2024-07-21 ENCOUNTER — Ambulatory Visit (INDEPENDENT_AMBULATORY_CARE_PROVIDER_SITE_OTHER)

## 2024-07-21 VITALS — BP 125/66 | HR 56 | Temp 98.7°F | Resp 16 | Ht 69.0 in | Wt 233.2 lb

## 2024-07-21 DIAGNOSIS — N189 Chronic kidney disease, unspecified: Secondary | ICD-10-CM | POA: Diagnosis not present

## 2024-07-21 DIAGNOSIS — D631 Anemia in chronic kidney disease: Secondary | ICD-10-CM | POA: Diagnosis not present

## 2024-07-21 MED ORDER — IRON SUCROSE 20 MG/ML IV SOLN
200.0000 mg | Freq: Once | INTRAVENOUS | Status: AC
  Administered 2024-07-21: 200 mg via INTRAVENOUS
  Filled 2024-07-21: qty 10

## 2024-07-21 NOTE — Progress Notes (Addendum)
 Diagnosis: Iron  Deficiency Anemia  Provider:  Praveen Mannam MD  Procedure: IV Push  IV Type: Peripheral, IV Location: L Forearm  Venofer  (Iron  Sucrose), Dose: 200 mg  Post Infusion IV Care: Observation period completed and Peripheral IV Discontinued  Discharge: Condition: Good, Destination: Home . AVS Declined  Performed by:  Waddell LOISE Freshwater, RN

## 2024-07-22 ENCOUNTER — Telehealth: Payer: Self-pay

## 2024-07-22 NOTE — Telephone Encounter (Signed)
 Copied from CRM #[id number]. Topic: Clinical - Medical Advice >> Jul 22, 2024  9:50 AM Nathanel DEL wrote: Reason for CRM: can pt get a flu shot when she gets her B12 at the same time?

## 2024-07-23 NOTE — Telephone Encounter (Signed)
 Left message on voicemail, per dpr, notifying she can get her flu shot and vit B12 shot at 07/31/24 [place].   Made note in Appt Note.

## 2024-07-24 ENCOUNTER — Ambulatory Visit

## 2024-07-25 ENCOUNTER — Ambulatory Visit

## 2024-07-25 ENCOUNTER — Ambulatory Visit (INDEPENDENT_AMBULATORY_CARE_PROVIDER_SITE_OTHER)

## 2024-07-25 VITALS — BP 123/75 | HR 66 | Temp 98.3°F | Resp 16 | Ht 69.0 in | Wt 231.0 lb

## 2024-07-25 DIAGNOSIS — D631 Anemia in chronic kidney disease: Secondary | ICD-10-CM | POA: Diagnosis not present

## 2024-07-25 DIAGNOSIS — N189 Chronic kidney disease, unspecified: Secondary | ICD-10-CM

## 2024-07-25 MED ORDER — IRON SUCROSE 20 MG/ML IV SOLN
200.0000 mg | Freq: Once | INTRAVENOUS | Status: AC
  Administered 2024-07-25: 200 mg via INTRAVENOUS
  Filled 2024-07-25: qty 10

## 2024-07-25 MED ORDER — SODIUM CHLORIDE 0.9 % IV BOLUS
250.0000 mL | Freq: Once | INTRAVENOUS | Status: DC
  Filled 2024-07-25: qty 250

## 2024-07-25 NOTE — Progress Notes (Signed)
 Diagnosis: Iron Deficiency Anemia  Provider:  Chilton Greathouse MD  Procedure: IV Push  IV Type: Peripheral, IV Location: L Forearm  Venofer (Iron Sucrose), Dose: 200 mg  Post Infusion IV Care: Patient declined observation and Peripheral IV Discontinued  Discharge: Condition: Good, Destination: Home . AVS Declined  Performed by:  Adriana Mccallum, RN

## 2024-07-28 ENCOUNTER — Ambulatory Visit (INDEPENDENT_AMBULATORY_CARE_PROVIDER_SITE_OTHER)

## 2024-07-28 ENCOUNTER — Ambulatory Visit: Admitting: Gastroenterology

## 2024-07-28 VITALS — BP 134/83 | HR 61 | Temp 98.9°F | Resp 18 | Ht 69.0 in | Wt 229.4 lb

## 2024-07-28 DIAGNOSIS — N189 Chronic kidney disease, unspecified: Secondary | ICD-10-CM | POA: Diagnosis not present

## 2024-07-28 DIAGNOSIS — D631 Anemia in chronic kidney disease: Secondary | ICD-10-CM | POA: Diagnosis not present

## 2024-07-28 MED ORDER — IRON SUCROSE 20 MG/ML IV SOLN
200.0000 mg | Freq: Once | INTRAVENOUS | Status: AC
  Administered 2024-07-28: 200 mg via INTRAVENOUS
  Filled 2024-07-28: qty 10

## 2024-07-28 NOTE — Progress Notes (Signed)
 Diagnosis: Iron Deficiency Anemia  Provider:  Chilton Greathouse MD  Procedure: IV Push  IV Type: Peripheral, IV Location: L Forearm  Venofer (Iron Sucrose), Dose: 200 mg  Post Infusion IV Care: Observation period completed and Peripheral IV Discontinued  Discharge: Condition: Good, Destination: Home . AVS Declined  Performed by:  Rico Ala, LPN

## 2024-07-30 ENCOUNTER — Other Ambulatory Visit: Payer: Self-pay | Admitting: Family Medicine

## 2024-07-30 DIAGNOSIS — E538 Deficiency of other specified B group vitamins: Secondary | ICD-10-CM

## 2024-07-30 DIAGNOSIS — D649 Anemia, unspecified: Secondary | ICD-10-CM

## 2024-07-30 NOTE — Telephone Encounter (Signed)
 Please see about getting the patient on the lab schedule for Thursday-I would like her to get labs collected before she has her B12 dose on Thursday.  I put in future orders.  Thanks.

## 2024-07-31 ENCOUNTER — Other Ambulatory Visit

## 2024-07-31 ENCOUNTER — Ambulatory Visit (INDEPENDENT_AMBULATORY_CARE_PROVIDER_SITE_OTHER)

## 2024-07-31 ENCOUNTER — Telehealth: Payer: Self-pay | Admitting: Family Medicine

## 2024-07-31 ENCOUNTER — Ambulatory Visit: Payer: Self-pay | Admitting: Family Medicine

## 2024-07-31 DIAGNOSIS — Z23 Encounter for immunization: Secondary | ICD-10-CM | POA: Diagnosis not present

## 2024-07-31 DIAGNOSIS — E538 Deficiency of other specified B group vitamins: Secondary | ICD-10-CM

## 2024-07-31 DIAGNOSIS — D649 Anemia, unspecified: Secondary | ICD-10-CM | POA: Diagnosis not present

## 2024-07-31 LAB — COMPREHENSIVE METABOLIC PANEL WITH GFR
ALT: 11 U/L (ref 0–35)
AST: 16 U/L (ref 0–37)
Albumin: 4.1 g/dL (ref 3.5–5.2)
Alkaline Phosphatase: 126 U/L — ABNORMAL HIGH (ref 39–117)
BUN: 49 mg/dL — ABNORMAL HIGH (ref 6–23)
CO2: 21 meq/L (ref 19–32)
Calcium: 9.3 mg/dL (ref 8.4–10.5)
Chloride: 103 meq/L (ref 96–112)
Creatinine, Ser: 3.25 mg/dL — ABNORMAL HIGH (ref 0.40–1.20)
GFR: 14.2 mL/min — CL (ref >60.00)
Glucose, Bld: 81 mg/dL (ref 70–99)
Potassium: 4.4 meq/L (ref 3.5–5.1)
Sodium: 136 meq/L (ref 135–145)
Total Bilirubin: 0.4 mg/dL (ref 0.2–1.2)
Total Protein: 7.5 g/dL (ref 6.0–8.3)

## 2024-07-31 LAB — CBC WITH DIFFERENTIAL/PLATELET
Basophils Absolute: 0 K/uL (ref 0.0–0.1)
Basophils Relative: 0.7 % (ref 0.0–3.0)
Eosinophils Absolute: 0.1 K/uL (ref 0.0–0.7)
Eosinophils Relative: 1 % (ref 0.0–5.0)
HCT: 33.8 % — ABNORMAL LOW (ref 36.0–46.0)
Hemoglobin: 11 g/dL — ABNORMAL LOW (ref 12.0–15.0)
Lymphocytes Relative: 18 % (ref 12.0–46.0)
Lymphs Abs: 1.2 K/uL (ref 0.7–4.0)
MCHC: 32.5 g/dL (ref 30.0–36.0)
MCV: 85.3 fl (ref 78.0–100.0)
Monocytes Absolute: 0.5 K/uL (ref 0.1–1.0)
Monocytes Relative: 8.3 % (ref 3.0–12.0)
Neutro Abs: 4.7 K/uL (ref 1.4–7.7)
Neutrophils Relative %: 72 % (ref 43.0–77.0)
Platelets: 324 K/uL (ref 150.0–400.0)
RBC: 3.97 Mil/uL (ref 3.87–5.11)
RDW: 17.8 % — ABNORMAL HIGH (ref 11.5–15.5)
WBC: 6.5 K/uL (ref 4.0–10.5)

## 2024-07-31 LAB — VITAMIN B12: Vitamin B-12: 230 pg/mL (ref 211–911)

## 2024-07-31 LAB — FERRITIN: Ferritin: 270.3 ng/mL (ref 10.0–291.0)

## 2024-07-31 LAB — TSH: TSH: 2.62 u[IU]/mL (ref 0.35–5.50)

## 2024-07-31 MED ORDER — CYANOCOBALAMIN 1000 MCG/ML IJ SOLN
1000.0000 ug | Freq: Once | INTRAMUSCULAR | Status: AC
  Administered 2024-07-31: 1000 ug via INTRAMUSCULAR

## 2024-07-31 NOTE — Telephone Encounter (Signed)
 Patient has been added to lab scheduled I have let nurse know she will review information when patient gets here and make sure she has labs before.

## 2024-07-31 NOTE — Telephone Encounter (Signed)
See result note.  Thanks!

## 2024-07-31 NOTE — Telephone Encounter (Signed)
  CRITICAL VALUE: GRF 14.20  RECEIVER (on-site recipient of call): Kent Braunschweig Y Brezlyn Manrique, CMA   DATE & TIME NOTIFIED: 2:21 PM 07/31/24   MESSENGER (representative from lab): Ernst   MD NOTIFIED: Dr. Arlyss Solian  TIME OF NOTIFICATION:2:21 PM  RESPONSE: Placed in red book and phone note sent hight priority message to provider

## 2024-07-31 NOTE — Progress Notes (Signed)
 Per orders of Dr. Arlyss Solian, injection of vitamin b 12 given by Laray Arenas in left deltoid. Patient tolerated injection well. Patient will make appointment for 1 month. Pt also got high dose flu shot 65+ IM in Right deltoid. Pt tolerated injection well.

## 2024-08-05 NOTE — Telephone Encounter (Signed)
 Copied from CRM [id number]. Topic: General - Other >> Aug 04, 2024  4:32 PM Alexandria E wrote: Reason for CRM: Patient returning call from [facility] about renal clinic plan. Patient stated that her kidney function was 13% for October, and now it is 14% for November. Patient is seeing Dr. Saralee with [facility] on 12/1, patient is hoping to get in sooner. Patient waiting for Dr. Saralee to review labs to see if they would potentially move her up, patient is now waiting for a return call from [facility].

## 2024-08-18 DIAGNOSIS — D631 Anemia in chronic kidney disease: Secondary | ICD-10-CM | POA: Diagnosis not present

## 2024-08-18 DIAGNOSIS — N179 Acute kidney failure, unspecified: Secondary | ICD-10-CM | POA: Diagnosis not present

## 2024-08-18 DIAGNOSIS — N2581 Secondary hyperparathyroidism of renal origin: Secondary | ICD-10-CM | POA: Diagnosis not present

## 2024-08-18 DIAGNOSIS — N184 Chronic kidney disease, stage 4 (severe): Secondary | ICD-10-CM | POA: Diagnosis not present

## 2024-08-25 DIAGNOSIS — N2581 Secondary hyperparathyroidism of renal origin: Secondary | ICD-10-CM | POA: Diagnosis not present

## 2024-08-25 DIAGNOSIS — I1310 Hypertensive heart and chronic kidney disease without heart failure, with stage 1 through stage 4 chronic kidney disease, or unspecified chronic kidney disease: Secondary | ICD-10-CM | POA: Diagnosis not present

## 2024-08-25 DIAGNOSIS — D631 Anemia in chronic kidney disease: Secondary | ICD-10-CM | POA: Diagnosis not present

## 2024-08-25 DIAGNOSIS — N184 Chronic kidney disease, stage 4 (severe): Secondary | ICD-10-CM | POA: Diagnosis not present

## 2024-09-04 ENCOUNTER — Ambulatory Visit

## 2024-09-04 DIAGNOSIS — E538 Deficiency of other specified B group vitamins: Secondary | ICD-10-CM | POA: Diagnosis not present

## 2024-09-04 MED ORDER — CYANOCOBALAMIN 1000 MCG/ML IJ SOLN
1000.0000 ug | Freq: Once | INTRAMUSCULAR | Status: AC
  Administered 2024-09-04: 1000 ug via INTRAMUSCULAR

## 2024-09-04 NOTE — Progress Notes (Signed)
 Per orders of Dr. Arlyss Solian, injection of vit b 12 IM given by Laray Arenas in right deltoid. Patient tolerated injection well. Patient will make appointment for 1 month.

## 2024-09-05 ENCOUNTER — Other Ambulatory Visit

## 2024-09-05 DIAGNOSIS — Z862 Personal history of diseases of the blood and blood-forming organs and certain disorders involving the immune mechanism: Secondary | ICD-10-CM | POA: Diagnosis not present

## 2024-09-05 LAB — CBC WITH DIFFERENTIAL/PLATELET
Basophils Absolute: 0.1 K/uL (ref 0.0–0.1)
Basophils Relative: 0.6 % (ref 0.0–3.0)
Eosinophils Absolute: 0.2 K/uL (ref 0.0–0.7)
Eosinophils Relative: 2.7 % (ref 0.0–5.0)
HCT: 35.4 % — ABNORMAL LOW (ref 36.0–46.0)
Hemoglobin: 11.8 g/dL — ABNORMAL LOW (ref 12.0–15.0)
Lymphocytes Relative: 18.5 % (ref 12.0–46.0)
Lymphs Abs: 1.5 K/uL (ref 0.7–4.0)
MCHC: 33.3 g/dL (ref 30.0–36.0)
MCV: 88.2 fl (ref 78.0–100.0)
Monocytes Absolute: 0.8 K/uL (ref 0.1–1.0)
Monocytes Relative: 10 % (ref 3.0–12.0)
Neutro Abs: 5.6 K/uL (ref 1.4–7.7)
Neutrophils Relative %: 68.2 % (ref 43.0–77.0)
Platelets: 340 K/uL (ref 150.0–400.0)
RBC: 4.01 Mil/uL (ref 3.87–5.11)
RDW: 17.5 % — ABNORMAL HIGH (ref 11.5–15.5)
WBC: 8.3 K/uL (ref 4.0–10.5)

## 2024-09-05 LAB — IBC + FERRITIN
Ferritin: 74.6 ng/mL (ref 10.0–291.0)
Iron: 79 ug/dL (ref 42–145)
Saturation Ratios: 21.2 % (ref 20.0–50.0)
TIBC: 372.4 ug/dL (ref 250.0–450.0)
Transferrin: 266 mg/dL (ref 212.0–360.0)

## 2024-09-05 LAB — FOLATE: Folate: >23.7 ng/mL (ref >5.9)

## 2024-09-05 LAB — VITAMIN B12: Vitamin B-12: >1500 pg/mL — ABNORMAL HIGH (ref 211–911)

## 2024-09-06 ENCOUNTER — Ambulatory Visit: Payer: Self-pay | Admitting: Gastroenterology

## 2024-09-09 NOTE — Telephone Encounter (Signed)
 Patient returned call, she states if at all possible if you can just message her on mychart. Please advise.

## 2024-09-11 ENCOUNTER — Other Ambulatory Visit: Payer: Self-pay

## 2024-09-11 DIAGNOSIS — Z79899 Other long term (current) drug therapy: Secondary | ICD-10-CM

## 2024-09-12 LAB — LIPID PANEL
Chol/HDL Ratio: 2.5 ratio (ref 0.0–4.4)
Cholesterol, Total: 192 mg/dL (ref 100–199)
HDL: 76 mg/dL
LDL Chol Calc (NIH): 97 mg/dL (ref 0–99)
Triglycerides: 108 mg/dL (ref 0–149)
VLDL Cholesterol Cal: 19 mg/dL (ref 5–40)

## 2024-09-17 ENCOUNTER — Other Ambulatory Visit: Payer: Self-pay | Admitting: Family Medicine

## 2024-09-21 ENCOUNTER — Ambulatory Visit: Payer: Self-pay | Admitting: Cardiovascular Disease

## 2024-09-28 NOTE — Telephone Encounter (Signed)
Sent. Thanks.  °Please get update on patient.   °

## 2024-10-02 ENCOUNTER — Ambulatory Visit

## 2024-10-02 ENCOUNTER — Other Ambulatory Visit: Payer: Self-pay | Admitting: Emergency Medicine

## 2024-10-02 DIAGNOSIS — Z79899 Other long term (current) drug therapy: Secondary | ICD-10-CM

## 2024-10-09 ENCOUNTER — Ambulatory Visit (INDEPENDENT_AMBULATORY_CARE_PROVIDER_SITE_OTHER)

## 2024-10-09 DIAGNOSIS — E538 Deficiency of other specified B group vitamins: Secondary | ICD-10-CM

## 2024-10-09 MED ORDER — CYANOCOBALAMIN 1000 MCG/ML IJ SOLN
1000.0000 ug | Freq: Once | INTRAMUSCULAR | Status: AC
  Administered 2024-10-09: 1000 ug via INTRAMUSCULAR

## 2024-10-09 NOTE — Progress Notes (Signed)
 Per orders of Dr. Arlyss Solian, injection of vitamin b 21 given by Laray Arenas in left deltoid. Patient tolerated injection well. Patient will make appointment for 1 month.

## 2024-10-13 ENCOUNTER — Encounter: Payer: Self-pay | Admitting: Family Medicine

## 2024-10-23 MED ORDER — ROSUVASTATIN CALCIUM 10 MG PO TABS: 10.0000 mg | ORAL_TABLET | Freq: Every day | ORAL | 3 refills | Status: AC

## 2024-10-28 ENCOUNTER — Other Ambulatory Visit: Payer: Self-pay | Admitting: Family Medicine

## 2025-04-01 ENCOUNTER — Ambulatory Visit
